# Patient Record
Sex: Male | Born: 1945 | Race: Black or African American | Hispanic: No | Smoking: Former smoker
Health system: Southern US, Community
[De-identification: ages and names within clinical notes are randomized; demographics above are authoritative.]

## PROBLEM LIST (undated history)

## (undated) DIAGNOSIS — I10 Essential (primary) hypertension: Secondary | ICD-10-CM

## (undated) DIAGNOSIS — E785 Hyperlipidemia, unspecified: Secondary | ICD-10-CM

## (undated) DIAGNOSIS — I4891 Unspecified atrial fibrillation: Secondary | ICD-10-CM

## (undated) DIAGNOSIS — I4892 Unspecified atrial flutter: Secondary | ICD-10-CM

## (undated) DIAGNOSIS — F528 Other sexual dysfunction not due to a substance or known physiological condition: Secondary | ICD-10-CM

## (undated) DIAGNOSIS — Z7901 Long term (current) use of anticoagulants: Secondary | ICD-10-CM

## (undated) DIAGNOSIS — R0609 Other forms of dyspnea: Secondary | ICD-10-CM

## (undated) DIAGNOSIS — Z86010 Personal history of colonic polyps: Secondary | ICD-10-CM

## (undated) DIAGNOSIS — IMO0002 Reserved for concepts with insufficient information to code with codable children: Secondary | ICD-10-CM

## (undated) DIAGNOSIS — R0600 Dyspnea, unspecified: Secondary | ICD-10-CM

## (undated) DIAGNOSIS — R932 Abnormal findings on diagnostic imaging of liver and biliary tract: Secondary | ICD-10-CM

## (undated) HISTORY — DX: Unspecified atrial fibrillation: I48.91

## (undated) HISTORY — DX: Long term (current) use of anticoagulants: Z79.01

## (undated) HISTORY — PX: POLYPECTOMY: SHX149

## (undated) HISTORY — DX: Other sexual dysfunction not due to a substance or known physiological condition: F52.8

## (undated) HISTORY — DX: Hyperlipidemia, unspecified: E78.5

## (undated) HISTORY — DX: Essential (primary) hypertension: I10

---
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
## 2005-08-03 ENCOUNTER — Ambulatory Visit: Payer: Self-pay | Admitting: Internal Medicine

## 2005-10-18 ENCOUNTER — Ambulatory Visit: Payer: Self-pay | Admitting: Internal Medicine

## 2005-11-02 ENCOUNTER — Ambulatory Visit: Payer: Self-pay | Admitting: Internal Medicine

## 2005-11-07 ENCOUNTER — Ambulatory Visit: Payer: Self-pay | Admitting: Internal Medicine

## 2006-09-27 ENCOUNTER — Ambulatory Visit: Payer: Self-pay | Admitting: Internal Medicine

## 2007-04-10 ENCOUNTER — Encounter: Payer: Self-pay | Admitting: Internal Medicine

## 2007-04-10 DIAGNOSIS — F528 Other sexual dysfunction not due to a substance or known physiological condition: Secondary | ICD-10-CM

## 2007-04-10 DIAGNOSIS — I10 Essential (primary) hypertension: Secondary | ICD-10-CM

## 2007-04-10 HISTORY — DX: Other sexual dysfunction not due to a substance or known physiological condition: F52.8

## 2007-04-10 HISTORY — DX: Essential (primary) hypertension: I10

## 2007-12-05 ENCOUNTER — Ambulatory Visit: Payer: Self-pay | Admitting: Internal Medicine

## 2007-12-05 LAB — CONVERTED CEMR LAB
Alkaline Phosphatase: 48 units/L (ref 39–117)
Bilirubin Urine: NEGATIVE
Bilirubin, Direct: 0.1 mg/dL (ref 0.0–0.3)
Crystals: NEGATIVE
GFR calc Af Amer: 61 mL/min
GFR calc non Af Amer: 51 mL/min
Glucose, Bld: 102 mg/dL — ABNORMAL HIGH (ref 70–99)
HCT: 41.2 % (ref 39.0–52.0)
HDL: 28.8 mg/dL — ABNORMAL LOW (ref >39.0)
Hemoglobin, Urine: NEGATIVE
LDL Cholesterol: 101 mg/dL — ABNORMAL HIGH (ref 0–99)
Leukocytes, UA: NEGATIVE
Lymphocytes Relative: 22 % (ref 12.0–46.0)
Monocytes Absolute: 0.9 10*3/uL (ref 0.1–1.0)
Monocytes Relative: 12.9 % — ABNORMAL HIGH (ref 3.0–12.0)
Nitrite: NEGATIVE
Platelets: 247 10*3/uL (ref 150–400)
Potassium: 3 meq/L — ABNORMAL LOW (ref 3.5–5.1)
RDW: 13.3 % (ref 11.5–14.6)
Sodium: 144 meq/L (ref 135–145)
Total Bilirubin: 0.6 mg/dL (ref 0.3–1.2)
Total CHOL/HDL Ratio: 4.8
Total Protein: 6.9 g/dL (ref 6.0–8.3)
Urobilinogen, UA: 0.2 (ref 0.0–1.0)
VLDL: 10 mg/dL (ref 0–40)

## 2008-03-18 ENCOUNTER — Telehealth (INDEPENDENT_AMBULATORY_CARE_PROVIDER_SITE_OTHER): Payer: Self-pay | Admitting: *Deleted

## 2008-04-09 ENCOUNTER — Ambulatory Visit: Payer: Self-pay | Admitting: Internal Medicine

## 2008-04-22 ENCOUNTER — Encounter: Payer: Self-pay | Admitting: Internal Medicine

## 2008-04-22 ENCOUNTER — Ambulatory Visit: Payer: Self-pay | Admitting: Internal Medicine

## 2008-04-22 DIAGNOSIS — Z86010 Personal history of colonic polyps: Secondary | ICD-10-CM | POA: Insufficient documentation

## 2008-04-27 ENCOUNTER — Encounter: Payer: Self-pay | Admitting: Internal Medicine

## 2008-12-10 ENCOUNTER — Ambulatory Visit: Payer: Self-pay | Admitting: Internal Medicine

## 2008-12-13 ENCOUNTER — Encounter: Payer: Self-pay | Admitting: Internal Medicine

## 2008-12-13 LAB — CONVERTED CEMR LAB
AST: 21 units/L (ref 0–37)
Albumin: 4 g/dL (ref 3.5–5.2)
Basophils Absolute: 0 10*3/uL (ref 0.0–0.1)
Basophils Relative: 0.3 % (ref 0.0–3.0)
CO2: 37 meq/L — ABNORMAL HIGH (ref 19–32)
Chloride: 100 meq/L (ref 96–112)
Cholesterol: 179 mg/dL (ref 0–200)
Eosinophils Absolute: 0.1 10*3/uL (ref 0.0–0.7)
Glucose, Bld: 88 mg/dL (ref 70–99)
HCT: 41.9 % (ref 39.0–52.0)
HDL: 29.9 mg/dL — ABNORMAL LOW (ref >39.00)
Hemoglobin: 14.2 g/dL (ref 13.0–17.0)
Ketones, ur: NEGATIVE mg/dL
Leukocytes, UA: NEGATIVE
Lymphs Abs: 1.8 10*3/uL (ref 0.7–4.0)
MCHC: 33.9 g/dL (ref 30.0–36.0)
Neutro Abs: 4.7 10*3/uL (ref 1.4–7.7)
PSA: 1.17 ng/mL (ref 0.10–4.00)
Potassium: 3.1 meq/L — ABNORMAL LOW (ref 3.5–5.1)
RDW: 13 % (ref 11.5–14.6)
Sodium: 143 meq/L (ref 135–145)
Specific Gravity, Urine: 1.015 (ref 1.000–1.030)
Total Protein: 7.8 g/dL (ref 6.0–8.3)
Urobilinogen, UA: 0.2 (ref 0.0–1.0)
VLDL: 32 mg/dL (ref 0.0–40.0)
pH: 7 (ref 5.0–8.0)

## 2009-11-28 ENCOUNTER — Ambulatory Visit: Payer: Self-pay | Admitting: Internal Medicine

## 2009-11-28 LAB — CONVERTED CEMR LAB
ALT: 19 units/L (ref 0–53)
AST: 24 units/L (ref 0–37)
Albumin: 3.8 g/dL (ref 3.5–5.2)
Alkaline Phosphatase: 59 units/L (ref 39–117)
Basophils Absolute: 0 10*3/uL (ref 0.0–0.1)
Basophils Relative: 0.4 % (ref 0.0–3.0)
Bilirubin Urine: NEGATIVE
Calcium: 9.7 mg/dL (ref 8.4–10.5)
Cholesterol: 176 mg/dL (ref 0–200)
Eosinophils Relative: 1.3 % (ref 0.0–5.0)
GFR calc non Af Amer: 78.79 mL/min (ref >60)
Glucose, Bld: 92 mg/dL (ref 70–99)
HCT: 39.7 % (ref 39.0–52.0)
Hemoglobin: 13.5 g/dL (ref 13.0–17.0)
Ketones, ur: NEGATIVE mg/dL
Leukocytes, UA: NEGATIVE
Lymphocytes Relative: 29.2 % (ref 12.0–46.0)
Lymphs Abs: 1.8 10*3/uL (ref 0.7–4.0)
Monocytes Relative: 8.9 % (ref 3.0–12.0)
Neutro Abs: 3.7 10*3/uL (ref 1.4–7.7)
Nitrite: NEGATIVE
PSA: 1 ng/mL (ref 0.10–4.00)
Potassium: 3.4 meq/L — ABNORMAL LOW (ref 3.5–5.1)
RBC: 4.51 M/uL (ref 4.22–5.81)
RDW: 14.2 % (ref 11.5–14.6)
Sodium: 145 meq/L (ref 135–145)
Specific Gravity, Urine: 1.025 (ref 1.000–1.030)
TSH: 0.53 microintl units/mL (ref 0.35–5.50)
Total Protein: 7.3 g/dL (ref 6.0–8.3)
Urobilinogen, UA: 0.2 (ref 0.0–1.0)
VLDL: 28.2 mg/dL (ref 0.0–40.0)
WBC: 6.2 10*3/uL (ref 4.5–10.5)
pH: 7 (ref 5.0–8.0)

## 2010-01-12 ENCOUNTER — Telehealth: Payer: Self-pay | Admitting: Internal Medicine

## 2010-09-12 NOTE — Assessment & Plan Note (Signed)
Summary: FU ON BP/NWS  #   Vital Signs:  Patient profile:   65 year old male Height:      70 inches Weight:      207 pounds BMI:     29.81 O2 Sat:      99 % on Room air Temp:     97.1 degrees F oral Pulse rate:   67 / minute BP sitting:   122 / 80  (left arm) Cuff size:   large  Vitals Entered ByZella Ball Ewing (November 28, 2009 1:26 PM)  O2 Flow:  Room air  CC: followup on Blood Pressure/RE   CC:  followup on Blood Pressure/RE.  History of Present Illness: not taking the diltiazem or K; but good compalicne with rest;  riding bike regularly and other isometric excercise;  Marcus Boyd denies CP, sob, doe, wheezing, orthopnea, pnd, worsening LE edema, palps, dizziness or syncope  Marcus Boyd denies new neuro symptoms such as headache, facial or extremity weakness   Tryign to foloow lower salt diet.    Preventive Screening-Counseling & Management      Drug Use:  no.    Problems Prior to Update: 1)  Preventive Health Care  (ICD-V70.0) 2)  Erectile Dysfunction  (ICD-302.72) 3)  Hypertension  (ICD-401.9)  Medications Prior to Update: 1)  Mult.vit. .... 1 By Mouth Qd 2)  Micardis Hct 80-25 Mg  Tabs (Telmisartan-Hctz) .Marland Kitchen.. 1 By Mouth Once Daily 3)  Diltiazem Hcl Cr 240 Mg Xr24h-Cap (Diltiazem Hcl) .Marland Kitchen.. 1 By Mouth Once Daily 4)  Adult Aspirin Ec Low Strength 81 Mg  Tbec (Aspirin) 5)  Viagra 100 Mg Tabs (Sildenafil Citrate) .Marland Kitchen.. 1 By Mouth Every Other Day As Needed 6)  Klor-Con 10 10 Meq Cr-Tabs (Potassium Chloride) .Marland Kitchen.. 1 By Mouth Once Daily  Current Medications (verified): 1)  Mult.vit. .... 1 By Mouth Qd 2)  Micardis Hct 80-25 Mg  Tabs (Telmisartan-Hctz) .Marland Kitchen.. 1 By Mouth Once Daily 3)  Adult Aspirin Ec Low Strength 81 Mg  Tbec (Aspirin) 4)  Viagra 100 Mg Tabs (Sildenafil Citrate) .Marland Kitchen.. 1 By Mouth Every Other Day As Needed  Allergies (verified): No Known Drug Allergies  Past History:  Past Medical History: Last updated: 12/05/2007 Hypertension E.D.  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         all otherwise negative per Marcus Boyd -    Physical Exam  General:  alert and overweight-appearing - mild  Head:  normocephalic and atraumatic.   Eyes:  vision grossly intact, pupils equal, and pupils round.   Ears:  R ear normal and L ear normal.   Nose:  no external deformity and no nasal discharge.   Mouth:  no gingival abnormalities and pharynx pink and moist.   Neck:  supple and no masses.   Lungs:  normal respiratory effort and normal breath sounds.   Heart:  normal rate and regular rhythm.   Abdomen:  soft, non-tender, and normal bowel sounds.   Msk:  no joint tenderness and no joint swelling.   Extremities:  no edema, no erythema  Neurologic:  cranial nerves II-XII intact and strength normal in all extremities.     Impression & Recommendations:  Problem # 1:  Preventive Health Care (ICD-V70.0)  Overall doing well, age appropriate education and counseling updated and referral for appropriate preventive services done unless  declined, immunizations up to date or declined, diet counseling done if overweight, urged to quit smoking if smokes , most recent labs reviewed and current ordered if appropriate, ecg reviewed or declined (interpretation per ECG scanned in the EMR if done); information regarding Medicare Prevention requirements given if appropriate   Orders: TLB-BMP (Basic Metabolic Panel-BMET) (80048-METABOL) TLB-CBC Platelet - w/Differential (85025-CBCD) TLB-Hepatic/Liver Function Pnl (80076-HEPATIC) TLB-Lipid Panel (80061-LIPID) TLB-TSH (Thyroid Stimulating Hormone) (84443-TSH) TLB-PSA (Prostate Specific Antigen) (84153-PSA) TLB-Udip ONLY (81003-UDIP)  Complete Medication List: 1)  Mult.vit.  .... 1 by mouth qd 2)  Micardis Hct 80-25 Mg Tabs (Telmisartan-hctz) .Marland Kitchen.. 1 by mouth once daily 3)  Adult Aspirin Ec Low Strength 81 Mg Tbec (Aspirin) 4)  Viagra 100 Mg Tabs (Sildenafil citrate) .Marland Kitchen.. 1 by mouth every other day as needed  Patient Instructions: 1)  Continue all previous medications as before this visit 2)  Please go to the Lab in the basement for your blood and/or urine tests today 3)  Please schedule a follow-up appointment in 1 year or sooner if needed Prescriptions: MICARDIS HCT 80-25 MG  TABS (TELMISARTAN-HCTZ) 1 by mouth once daily  #90 x 3   Entered and Authorized by:   Corwin Levins MD   Signed by:   Corwin Levins MD on 11/28/2009   Method used:   Print then Give to Patient   RxID:   [id number]

## 2010-09-12 NOTE — Progress Notes (Signed)
Summary: Rx req  Phone Note Refill Request   Refills Requested: Medication #1:  VIAGRA 100 MG TABS 1 by mouth every other day as needed.   Dosage confirmed as above?Dosage Confirmed Rx was not recieved at Pharmacy, verified with Pharmacist  Initial call taken by: Margaret Pyle, CMA,  January 12, 2010 11:06 AM    Prescriptions: VIAGRA 100 MG TABS (SILDENAFIL CITRATE) 1 by mouth every other day as needed  #5 x 11   Entered and Authorized by:   Margaret Pyle, CMA   Signed by:   Margaret Pyle, CMA on 01/12/2010   Method used:   Telephoned to ...       [id number] (retail)       [address]       [place], [place]  [address]       Ph: [phone] or [phone]       Fax: [phone]   RxID:   [id number]

## 2010-11-07 ENCOUNTER — Other Ambulatory Visit: Payer: Self-pay

## 2010-11-07 ENCOUNTER — Other Ambulatory Visit: Payer: Self-pay | Admitting: *Deleted

## 2010-11-07 DIAGNOSIS — Z0000 Encounter for general adult medical examination without abnormal findings: Secondary | ICD-10-CM

## 2010-11-07 DIAGNOSIS — Z0389 Encounter for observation for other suspected diseases and conditions ruled out: Secondary | ICD-10-CM

## 2010-11-14 ENCOUNTER — Ambulatory Visit (INDEPENDENT_AMBULATORY_CARE_PROVIDER_SITE_OTHER): Payer: BC Managed Care – PPO | Admitting: Internal Medicine

## 2010-11-14 ENCOUNTER — Other Ambulatory Visit (INDEPENDENT_AMBULATORY_CARE_PROVIDER_SITE_OTHER): Payer: BC Managed Care – PPO

## 2010-11-14 ENCOUNTER — Other Ambulatory Visit: Payer: Self-pay | Admitting: Internal Medicine

## 2010-11-14 ENCOUNTER — Other Ambulatory Visit: Payer: BC Managed Care – PPO

## 2010-11-14 ENCOUNTER — Other Ambulatory Visit (INDEPENDENT_AMBULATORY_CARE_PROVIDER_SITE_OTHER): Payer: BC Managed Care – PPO | Admitting: Internal Medicine

## 2010-11-14 ENCOUNTER — Encounter: Payer: Self-pay | Admitting: Internal Medicine

## 2010-11-14 VITALS — BP 120/82 | HR 77 | Temp 98.1°F | Ht 72.0 in | Wt 213.1 lb

## 2010-11-14 DIAGNOSIS — Z0000 Encounter for general adult medical examination without abnormal findings: Secondary | ICD-10-CM

## 2010-11-14 DIAGNOSIS — E785 Hyperlipidemia, unspecified: Secondary | ICD-10-CM | POA: Insufficient documentation

## 2010-11-14 DIAGNOSIS — Z23 Encounter for immunization: Secondary | ICD-10-CM

## 2010-11-14 DIAGNOSIS — I10 Essential (primary) hypertension: Secondary | ICD-10-CM

## 2010-11-14 DIAGNOSIS — Z0389 Encounter for observation for other suspected diseases and conditions ruled out: Secondary | ICD-10-CM

## 2010-11-14 DIAGNOSIS — Z0001 Encounter for general adult medical examination with abnormal findings: Secondary | ICD-10-CM | POA: Insufficient documentation

## 2010-11-14 HISTORY — DX: Hyperlipidemia, unspecified: E78.5

## 2010-11-14 LAB — URINALYSIS
Bilirubin Urine: NEGATIVE
Leukocytes, UA: NEGATIVE
Nitrite: NEGATIVE
Specific Gravity, Urine: 1.02 (ref 1.000–1.030)
Urobilinogen, UA: 0.2 (ref 0.0–1.0)
pH: 8 (ref 5.0–8.0)

## 2010-11-14 LAB — CBC WITH DIFFERENTIAL/PLATELET
Basophils Relative: 0.3 % (ref 0.0–3.0)
Eosinophils Absolute: 0.1 10*3/uL (ref 0.0–0.7)
Lymphocytes Relative: 24.7 % (ref 12.0–46.0)
MCHC: 34.1 g/dL (ref 30.0–36.0)
Monocytes Absolute: 0.7 10*3/uL (ref 0.1–1.0)
Neutrophils Relative %: 64.7 % (ref 43.0–77.0)
Platelets: 279 10*3/uL (ref 150.0–400.0)
RBC: 4.81 Mil/uL (ref 4.22–5.81)
WBC: 7.1 10*3/uL (ref 4.5–10.5)

## 2010-11-14 MED ORDER — TELMISARTAN-HCTZ 80-25 MG PO TABS: 1.0000 | ORAL_TABLET | Freq: Every day | ORAL | Status: DC

## 2010-11-14 MED ORDER — SILDENAFIL CITRATE 100 MG PO TABS: 100.0000 mg | ORAL_TABLET | ORAL | Status: DC | PRN

## 2010-11-14 NOTE — Patient Instructions (Signed)
You had the shingles shot today Please go to LAB in the Basement for the blood and/or urine tests to be done today Please call the number on the Blue Card (the [employer] System) for results of testing in 2-3 days Please return in 1 year for your yearly visit, or sooner if needed, with Lab testing done 3-5 days before

## 2010-11-14 NOTE — Assessment & Plan Note (Signed)
stable overall by hx and exam, most recent lab reviewed with pt, and pt to continue medical treatment as before   Lab Results  Component Value Date   WBC 6.2 11/28/2009   HGB 13.5 11/28/2009   HGB NEGATIVE 11/28/2009   HCT 39.7 11/28/2009   PLT 292.0 11/28/2009   CHOL 176 11/28/2009   TRIG 141.0 11/28/2009   HDL 35.00* 11/28/2009   ALT 19 11/28/2009   AST 24 11/28/2009   NA 145 11/28/2009   K 3.4* 11/28/2009   CL 101 11/28/2009   CREATININE 1.2 11/28/2009   BUN 10 11/28/2009   CO2 37* 11/28/2009   TSH 0.53 11/28/2009   PSA 1.00 11/28/2009

## 2010-11-14 NOTE — Progress Notes (Signed)
Subjective:    Patient ID: Marcus Boyd, male    DOB: 1946/05/09, 65 y.o.   MRN: [id number]  HPI Here for wellness and f/u;  Overall doing ok;  Pt denies CP, worsening SOB, DOE, wheezing, orthopnea, PND, worsening LE edema, palpitations, dizziness or syncope.  Pt denies neurological change such as new Headache, facial or extremity weakness.  Pt denies polydipsia, polyuria, or low sugar symptoms. Pt states overall good compliance with treatment and medications, good tolerability, and trying to follow lower cholesterol diet.  Pt denies worsening depressive symptoms, suicidal ideation or panic. No fever, wt loss, night sweats, loss of appetite, or other constitutional symptoms.  Pt states good ability with ADL's, low fall risk, home safety reviewed and adequate, no significant changes in hearing or vision, and occasionally active with exercise. Still drives and overall feels he can cont to work until 66yo, then retire.   Past Medical History  Diagnosis Date  . ERECTILE DYSFUNCTION 04/10/2007  . HYPERTENSION 04/10/2007  . Hyperlipemia 11/14/2010   No past surgical history on file.  reports that he has quit smoking. He does not have any smokeless tobacco history on file. He reports that he drinks alcohol. He reports that he does not use illicit drugs. family history includes Cancer in his father and Hypertension in his mother and sister. No Known Allergies  Current Outpatient Prescriptions on File Prior to Visit  Medication Sig Dispense Refill  . aspirin 81 MG EC tablet Take 81 mg by mouth daily.        . Multiple Vitamin (MULTIVITAMIN) tablet Take 1 tablet by mouth daily.        Marland Kitchen DISCONTD: telmisartan-hydrochlorothiazide (MICARDIS HCT) 80-25 MG per tablet Take 1 tablet by mouth daily.         Review of Systems Review of Systems  Constitutional: Negative for diaphoresis, activity change, appetite change and unexpected weight change.  HENT: Negative for hearing loss, ear pain, facial swelling,  mouth sores and neck stiffness.   Eyes: Negative for pain, redness and visual disturbance.  Respiratory: Negative for shortness of breath and wheezing.   Cardiovascular: Negative for chest pain and palpitations.  Gastrointestinal: Negative for diarrhea, blood in stool, abdominal distention and rectal pain.  Genitourinary: Negative for hematuria, flank pain and decreased urine volume.  Musculoskeletal: Negative for myalgias and joint swelling.  Skin: Negative for color change and wound.  Neurological: Negative for syncope and numbness.  Hematological: Negative for adenopathy.  Psychiatric/Behavioral: Negative for hallucinations, self-injury, decreased concentration and agitation.      Objective:   Physical Exam BP 120/82  Pulse 77  Temp(Src) 98.1 F (36.7 C) (Oral)  Ht 6' (1.829 m)  Wt 213 lb 2 oz (96.673 kg)  BMI 28.90 kg/m2  SpO2 98% Physical Exam  VS noted Constitutional: Pt is oriented to person, place, and time. Appears well-developed and well-nourished.  HENT:  Head: Normocephalic and atraumatic.  Right Ear: External ear normal.  Left Ear: External ear normal.  Nose: Nose normal.  Mouth/Throat: Oropharynx is clear and moist.  Eyes: Conjunctivae and EOM are normal. Pupils are equal, round, and reactive to light.  Neck: Normal range of motion. Neck supple. No JVD present. No tracheal deviation present.  Cardiovascular: Normal rate, regular rhythm, normal heart sounds and intact distal pulses.   Pulmonary/Chest: Effort normal and breath sounds normal.  Abdominal: Soft. Bowel sounds are normal. There is no tenderness.  Musculoskeletal: Normal range of motion. Exhibits no edema.  Lymphadenopathy:  Has no cervical adenopathy.  Neurological: Pt is alert and oriented to person, place, and time. Pt has normal reflexes. No cranial nerve deficit.  Skin: Skin is warm and dry. No rash noted.  Psychiatric:  Has  normal mood and affect. Behavior is normal.          Assessment &  Plan:

## 2010-11-14 NOTE — Assessment & Plan Note (Signed)
Mild, for diet for now, with goal ldl < 100  Lab Results  Component Value Date   LDLCALC 113* 11/28/2009

## 2010-11-15 LAB — HEPATIC FUNCTION PANEL
Alkaline Phosphatase: 59 U/L (ref 39–117)
Bilirubin, Direct: 0 mg/dL (ref 0.0–0.3)
Total Bilirubin: 0.5 mg/dL (ref 0.3–1.2)
Total Protein: 7 g/dL (ref 6.0–8.3)

## 2010-11-15 LAB — LIPID PANEL
Total CHOL/HDL Ratio: 6
VLDL: 58.2 mg/dL — ABNORMAL HIGH (ref 0.0–40.0)

## 2010-11-15 LAB — LDL CHOLESTEROL, DIRECT: Direct LDL: 99.3 mg/dL

## 2011-05-23 ENCOUNTER — Encounter: Payer: Self-pay | Admitting: Internal Medicine

## 2011-10-31 ENCOUNTER — Other Ambulatory Visit: Payer: Self-pay | Admitting: Internal Medicine

## 2011-11-16 ENCOUNTER — Telehealth: Payer: Self-pay

## 2011-11-16 ENCOUNTER — Ambulatory Visit (INDEPENDENT_AMBULATORY_CARE_PROVIDER_SITE_OTHER)
Admission: RE | Admit: 2011-11-16 | Discharge: 2011-11-16 | Disposition: A | Discharge status: Home / Self Care | Payer: BC Managed Care – PPO | Source: Ambulatory Visit | Attending: Internal Medicine | Admitting: Internal Medicine

## 2011-11-16 ENCOUNTER — Other Ambulatory Visit (INDEPENDENT_AMBULATORY_CARE_PROVIDER_SITE_OTHER): Payer: BC Managed Care – PPO

## 2011-11-16 ENCOUNTER — Encounter: Payer: Self-pay | Admitting: Internal Medicine

## 2011-11-16 ENCOUNTER — Ambulatory Visit (INDEPENDENT_AMBULATORY_CARE_PROVIDER_SITE_OTHER): Payer: BC Managed Care – PPO | Admitting: Internal Medicine

## 2011-11-16 VITALS — BP 100/70 | HR 100 | Temp 97.6°F | Resp 16 | Ht 72.0 in | Wt 217.0 lb

## 2011-11-16 DIAGNOSIS — I4891 Unspecified atrial fibrillation: Secondary | ICD-10-CM

## 2011-11-16 DIAGNOSIS — Z0000 Encounter for general adult medical examination without abnormal findings: Secondary | ICD-10-CM

## 2011-11-16 DIAGNOSIS — I10 Essential (primary) hypertension: Secondary | ICD-10-CM

## 2011-11-16 DIAGNOSIS — Z7901 Long term (current) use of anticoagulants: Secondary | ICD-10-CM

## 2011-11-16 DIAGNOSIS — I483 Typical atrial flutter: Secondary | ICD-10-CM | POA: Insufficient documentation

## 2011-11-16 HISTORY — DX: Unspecified atrial fibrillation: I48.91

## 2011-11-16 LAB — CBC WITH DIFFERENTIAL/PLATELET
Basophils Relative: 0.2 % (ref 0.0–3.0)
Eosinophils Relative: 1.5 % (ref 0.0–5.0)
Lymphocytes Relative: 31.5 % (ref 12.0–46.0)
MCV: 87.9 fl (ref 78.0–100.0)
Monocytes Absolute: 0.7 10*3/uL (ref 0.1–1.0)
Neutrophils Relative %: 57.5 % (ref 43.0–77.0)
RBC: 4.65 Mil/uL (ref 4.22–5.81)
WBC: 7.3 10*3/uL (ref 4.5–10.5)

## 2011-11-16 LAB — URINALYSIS, ROUTINE W REFLEX MICROSCOPIC
Hgb urine dipstick: NEGATIVE
Ketones, ur: NEGATIVE
Leukocytes, UA: NEGATIVE
Specific Gravity, Urine: 1.02 (ref 1.000–1.030)
Urine Glucose: NEGATIVE
Urobilinogen, UA: 0.2 (ref 0.0–1.0)

## 2011-11-16 LAB — HEPATIC FUNCTION PANEL
ALT: 16 U/L (ref 0–53)
Alkaline Phosphatase: 54 U/L (ref 39–117)
Bilirubin, Direct: 0.1 mg/dL (ref 0.0–0.3)
Total Protein: 7 g/dL (ref 6.0–8.3)

## 2011-11-16 LAB — PROTIME-INR
INR: 1 ratio (ref 0.8–1.0)
Prothrombin Time: 11.4 s (ref 10.2–12.4)

## 2011-11-16 LAB — BASIC METABOLIC PANEL
BUN: 12 mg/dL (ref 6–23)
Calcium: 9.4 mg/dL (ref 8.4–10.5)
Creatinine, Ser: 1.2 mg/dL (ref 0.4–1.5)
GFR: 78.29 mL/min (ref >60.00)

## 2011-11-16 LAB — LIPID PANEL: Cholesterol: 163 mg/dL (ref 0–200)

## 2011-11-16 MED ORDER — METOPROLOL SUCCINATE ER 50 MG PO TB24: 50.0000 mg | ORAL_TABLET | Freq: Every day | ORAL | Status: DC

## 2011-11-16 MED ORDER — WARFARIN SODIUM 5 MG PO TABS: ORAL_TABLET | ORAL | Status: DC

## 2011-11-16 MED ORDER — SILDENAFIL CITRATE 100 MG PO TABS: 100.0000 mg | ORAL_TABLET | ORAL | Status: DC | PRN

## 2011-11-16 MED ORDER — TELMISARTAN-HCTZ 40-12.5 MG PO TABS: 1.0000 | ORAL_TABLET | Freq: Every day | ORAL | Status: DC

## 2011-11-16 NOTE — Telephone Encounter (Signed)
Pt states simply not able to return for INR monitoring at this time, so I consider coumadin start to be high risk at this time, higher than yearly risk of afib related cva at approx 8% per yr;  Ideally he should start coumadin longterm, but simply not logistically able, though pt understands the risk of stroke.  OK for now for ASA 325 mg, with f/u as planned, also still for echo, metoprolol and micardis hct changes, and card referral

## 2011-11-16 NOTE — Patient Instructions (Addendum)
You have a new problem called atrial fibrillation today, and currently your heart rate is too fast Please STOP the aspirin  Please STOP the Micardis HCT 80/25 mg per day Please start the Micardis HCT 40/12.5 mg - 1 per day (you are given the hardcopy of the prescription today, to start after you finish taking HALF of the medication you have leftover at home of the higher strength)  Please start the Coumadin (warfarin) blood thinner;  This is a very strong blood thinner to prevent stroke from the atrial fibrillation; your starting dose is 5 mg per day, but this may need to be adjusted depending on your blood tests in the future.  Please plan to return on Tuesday November 20, 2011 for a coumadin blood test ('INR" - your goal is to be between 2 - 3, with normal being about 1.0)  Please start the metoprolol ER 50 mg per day (to get the heart rate down) - this was sent on the computer to your pharmacy  Please go to LAB in the Basement for the blood and/or urine tests to be done today Please go to XRAY in the Basement for the x-ray test today  You will be contacted regarding the referral for: echocardiogram, and cardiology referral  You will be contacted by phone if any changes need to be made immediately based on your blood work today.  Otherwise, you will receive a letter about your results with an explanation.  Please return here for Office Visit in 1 week; please avoid exertion such as running on treadmill until you see cardiology

## 2011-11-16 NOTE — Telephone Encounter (Signed)
Patient was instructed by MD to follow-up on 10/20/11 to check PT/INR after starting coumadin from 10/16/11 CPE.  The patient stated he could not return on 3/9 due to work. Dr. Jonny Ruiz requested taking the patient out of work for one week, but patient could not do so.  The patient was then instructed not to start coumadin at this time, but was instructed to take aspirin 325 mg daily and return in 2 weeks to follow up with Dr. Jonny Ruiz. Patient understood instructions.

## 2011-11-16 NOTE — Telephone Encounter (Signed)
Correction on dates for above note. Follow-up date should be 11/20/11 and CPE 11/16/11.

## 2011-11-16 NOTE — Assessment & Plan Note (Addendum)
Overall doing well, age appropriate education and counseling updated, referrals for preventative services and immunizations addressed, dietary and smoking counseling addressed, most recent labs and ECG reviewed.  I have personally reviewed and have noted: 1) the patient's medical and social history 2) The pt's use of alcohol, tobacco, and illicit drugs 3) The patient's current medications and supplements 4) Functional ability including ADL's, fall risk, home safety risk, hearing and visual impairment 5) Diet and physical activities 6) Evidence for depression or mood disorder 7) The patient's height, weight, and BMI have been recorded in the chart I have made referrals, and provided counseling and education based on review of the above ECG reviewed as per emr, declines further immunizations

## 2011-11-17 ENCOUNTER — Encounter: Payer: Self-pay | Admitting: Internal Medicine

## 2011-11-17 NOTE — Assessment & Plan Note (Signed)
Volume stable today, will need to decr the micardis HCT to half to accomodate the beta blocker,  to f/u any worsening symptoms or concerns

## 2011-11-17 NOTE — Progress Notes (Signed)
Subjective:    Patient ID: Marcus Boyd, male    DOB: [birth year], 66 y.o.   MRN: [id number]  HPI  Here for wellness and f/u;  Overall doing ok;  Pt denies CP, worsening SOB, DOE, wheezing, orthopnea, PND, worsening LE edema, palpitations, dizziness or syncope.  Pt denies neurological change such as new Headache, facial or extremity weakness.  Pt denies polydipsia, polyuria, or low sugar symptoms. Pt states overall good compliance with treatment and medications, good tolerability, and trying to follow lower cholesterol diet.  Pt denies worsening depressive symptoms, suicidal ideation or panic. No fever, wt loss, night sweats, loss of appetite, or other constitutional symptoms.  Pt states good ability with ADL's, low fall risk, home safety reviewed and adequate, no significant changes in hearing or vision, and occasionally active with exercise.  No acute complaints.  Works as Naval architect mostly out of town. Past Medical History  Diagnosis Date  . ERECTILE DYSFUNCTION 04/10/2007  . HYPERTENSION 04/10/2007  . Hyperlipemia 11/14/2010  . Atrial fibrillation, new onset 11/16/2011  . Warfarin anticoagulation 11/16/2011   History reviewed. No pertinent past surgical history.  reports that he has quit smoking. He has never used smokeless tobacco. He reports that he drinks alcohol. He reports that he does not use illicit drugs. family history includes Cancer in his father and Hypertension in his mother and sister. No Known Allergies Current Outpatient Prescriptions on File Prior to Visit  Medication Sig Dispense Refill  . Multiple Vitamin (MULTIVITAMIN) tablet Take 1 tablet by mouth daily.        . metoprolol succinate (TOPROL-XL) 50 MG 24 hr tablet Take 1 tablet (50 mg total) by mouth daily. Take with or immediately following a meal.  90 tablet  3  . sildenafil (VIAGRA) 100 MG tablet Take 1 tablet (100 mg total) by mouth as needed for erectile dysfunction.  5 tablet  11  . warfarin (COUMADIN) 5 MG tablet  Use as instructed, once per day  150 tablet  3   Review of Systems Review of Systems  Constitutional: Negative for diaphoresis, activity change, appetite change and unexpected weight change.  HENT: Negative for hearing loss, ear pain, facial swelling, mouth sores and neck stiffness.   Eyes: Negative for pain, redness and visual disturbance.  Respiratory: Negative for shortness of breath and wheezing.   Cardiovascular: Negative for chest pain and palpitations.  Gastrointestinal: Negative for diarrhea, blood in stool, abdominal distention and rectal pain.  Genitourinary: Negative for hematuria, flank pain and decreased urine volume.  Musculoskeletal: Negative for myalgias and joint swelling.  Skin: Negative for color change and wound.  Neurological: Negative for syncope and numbness.  Hematological: Negative for adenopathy.  Psychiatric/Behavioral: Negative for hallucinations, self-injury, decreased concentration and agitation.      Objective:   Physical Exam BP 100/70  Pulse 100  Temp(Src) 97.6 F (36.4 C) (Oral)  Resp 16  Ht 6' (1.829 m)  Wt 217 lb (98.431 kg)  BMI 29.43 kg/m2  SpO2 98% Physical Exam  VS noted Constitutional: Pt is oriented to person, place, and time. Appears well-developed and well-nourished.  HENT:  Head: Normocephalic and atraumatic.  Right Ear: External ear normal.  Left Ear: External ear normal.  Nose: Nose normal.  Mouth/Throat: Oropharynx is clear and moist.  Eyes: Conjunctivae and EOM are normal. Pupils are equal, round, and reactive to light.  Neck: Normal range of motion. Neck supple. No JVD present. No tracheal deviation present.  Cardiovascular: tachy, irreg rhythm, normal heart sounds and intact  distal pulses.   Pulmonary/Chest: Effort normal and breath sounds normal.  Abdominal: Soft. Bowel sounds are normal. There is no tenderness.  Musculoskeletal: Normal range of motion. Exhibits no edema.  Lymphadenopathy:  Has no cervical adenopathy.    Neurological: Pt is alert and oriented to person, place, and time. Pt has normal reflexes. No cranial nerve deficit.  Skin: Skin is warm and dry. No rash noted.  Psychiatric:  Has  normal mood and affect. Behavior is normal.     Assessment & Plan:

## 2011-11-17 NOTE — Assessment & Plan Note (Signed)
For baseling INR today, then 5 qd, f/u next tues apr 9

## 2011-11-17 NOTE — Assessment & Plan Note (Signed)
New onset, asympt, to start metoprolol 50 for better rate control, stop asa, start coumadin, for echo, card referral

## 2011-11-23 ENCOUNTER — Ambulatory Visit: Payer: BC Managed Care – PPO | Admitting: Internal Medicine

## 2011-11-30 ENCOUNTER — Ambulatory Visit (INDEPENDENT_AMBULATORY_CARE_PROVIDER_SITE_OTHER): Payer: BC Managed Care – PPO | Admitting: Internal Medicine

## 2011-11-30 ENCOUNTER — Ambulatory Visit (HOSPITAL_COMMUNITY): Payer: BC Managed Care – PPO | Attending: Cardiovascular Disease

## 2011-11-30 ENCOUNTER — Other Ambulatory Visit: Payer: Self-pay

## 2011-11-30 ENCOUNTER — Encounter: Payer: Self-pay | Admitting: Internal Medicine

## 2011-11-30 ENCOUNTER — Other Ambulatory Visit (INDEPENDENT_AMBULATORY_CARE_PROVIDER_SITE_OTHER): Payer: BC Managed Care – PPO

## 2011-11-30 VITALS — BP 110/72 | HR 95 | Temp 98.1°F | Ht 72.0 in | Wt 218.2 lb

## 2011-11-30 DIAGNOSIS — Z7901 Long term (current) use of anticoagulants: Secondary | ICD-10-CM

## 2011-11-30 DIAGNOSIS — I4891 Unspecified atrial fibrillation: Secondary | ICD-10-CM | POA: Insufficient documentation

## 2011-11-30 DIAGNOSIS — E785 Hyperlipidemia, unspecified: Secondary | ICD-10-CM

## 2011-11-30 DIAGNOSIS — R0609 Other forms of dyspnea: Secondary | ICD-10-CM | POA: Insufficient documentation

## 2011-11-30 DIAGNOSIS — R0989 Other specified symptoms and signs involving the circulatory and respiratory systems: Secondary | ICD-10-CM | POA: Insufficient documentation

## 2011-11-30 DIAGNOSIS — Z0000 Encounter for general adult medical examination without abnormal findings: Secondary | ICD-10-CM

## 2011-11-30 DIAGNOSIS — I10 Essential (primary) hypertension: Secondary | ICD-10-CM | POA: Insufficient documentation

## 2011-11-30 DIAGNOSIS — Z87891 Personal history of nicotine dependence: Secondary | ICD-10-CM | POA: Insufficient documentation

## 2011-11-30 DIAGNOSIS — I059 Rheumatic mitral valve disease, unspecified: Secondary | ICD-10-CM | POA: Insufficient documentation

## 2011-11-30 DIAGNOSIS — I517 Cardiomegaly: Secondary | ICD-10-CM | POA: Insufficient documentation

## 2011-11-30 LAB — HEPATIC FUNCTION PANEL
ALT: 18 U/L (ref 0–53)
AST: 22 U/L (ref 0–37)
Albumin: 3.9 g/dL (ref 3.5–5.2)
Alkaline Phosphatase: 56 U/L (ref 39–117)
Total Protein: 7.1 g/dL (ref 6.0–8.3)

## 2011-11-30 LAB — CBC WITH DIFFERENTIAL/PLATELET
Basophils Relative: 0.3 % (ref 0.0–3.0)
HCT: 42 % (ref 39.0–52.0)
Hemoglobin: 13.8 g/dL (ref 13.0–17.0)
Lymphocytes Relative: 30.1 % (ref 12.0–46.0)
MCHC: 32.8 g/dL (ref 30.0–36.0)
Monocytes Relative: 9.6 % (ref 3.0–12.0)
Neutro Abs: 4.4 10*3/uL (ref 1.4–7.7)
RBC: 4.73 Mil/uL (ref 4.22–5.81)

## 2011-11-30 LAB — LIPID PANEL
Cholesterol: 174 mg/dL (ref 0–200)
Triglycerides: 220 mg/dL — ABNORMAL HIGH (ref 0.0–149.0)

## 2011-11-30 LAB — BASIC METABOLIC PANEL
Calcium: 9 mg/dL (ref 8.4–10.5)
Creatinine, Ser: 1.2 mg/dL (ref 0.4–1.5)
GFR: 76.8 mL/min (ref >60.00)

## 2011-11-30 LAB — PROTIME-INR: INR: 1.2 ratio — ABNORMAL HIGH (ref 0.8–1.0)

## 2011-11-30 LAB — URINALYSIS, ROUTINE W REFLEX MICROSCOPIC
Hgb urine dipstick: NEGATIVE
Leukocytes, UA: NEGATIVE
Urine Glucose: NEGATIVE
Urobilinogen, UA: 0.2 (ref 0.0–1.0)

## 2011-11-30 LAB — TSH: TSH: 0.93 u[IU]/mL (ref 0.35–5.50)

## 2011-11-30 LAB — PSA: PSA: 2.6 ng/mL (ref 0.10–4.00)

## 2011-11-30 MED ORDER — METOPROLOL SUCCINATE ER 100 MG PO TB24: 100.0000 mg | ORAL_TABLET | Freq: Every day | ORAL | Status: DC

## 2011-11-30 MED ORDER — HYDROCHLOROTHIAZIDE 12.5 MG PO CAPS: 12.5000 mg | ORAL_CAPSULE | Freq: Every day | ORAL | Status: DC

## 2011-11-30 MED ORDER — LISINOPRIL 5 MG PO TABS: 5.0000 mg | ORAL_TABLET | Freq: Every day | ORAL | Status: DC

## 2011-11-30 NOTE — Patient Instructions (Addendum)
Please continue the coumadin as you are doing  We will need a blood test today called the INR and we want the result to be between 2 and 3;  You will be called with the results, and if the dose needs to be changed OK to stop the Aspirin completely OK to stop the Micardis HCT completely OK to stop the metoprolol ER 50 mg  Please start Lisinopril 5 mg per day (a low dose) Please start Hydrochlorothizide at 12.5 mg per day (a low fluid pill, the same as you have been taking with the MicardisHCT) Please start metoprolol ER 100 mg per day Please see the [facility] today to schedule your cardiology appointment All of your new medications are sent to the pharmacy today

## 2011-11-30 NOTE — Assessment & Plan Note (Addendum)
ECG reviewed as per emr  - afib rate approx 130 on current metoprolol ER 50 qd,  Volume stable on micardisHCT 40/12.5;  Echo reviewed as well with pt - EF 35-40% with only mild LAA,  Will increase the metoprolol ER to 100 qd, stop the micardis HCT, start lisinopril 5 qd, and hctz 12.5 d, and pt to make appt with card, no way to tell time of onset but ? May be candidate for cardioversion?  Note:  Time for pt hx, eval, review of record with pt in room, determination of the diagnosis, plan for eval and tx with pt including counseling is > 40 min

## 2011-12-01 NOTE — Progress Notes (Signed)
  Subjective:    Patient ID: Marcus Boyd, male    DOB: 1946/05/11, 66 y.o.   MRN: [id number]  HPI  Here to f/u;  At last visit pt was overwhelmed with tx recommendations, took ASA 325 mg daily and did not take the coumadin until 7 days ago at 5 mg per day, now taking both.  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  No overt bleeding or bruising.  Curiously remains asymptomatic  - Pt denies chest pain, increased sob or doe, wheezing, orthopnea, PND, increased LE swelling, palpitations, dizziness or syncope.  Pt denies new neurological symptoms such as new headache, or facial or extremity weakness or numbness   Pt denies polydipsia, polyuria. No other acute complaints Past Medical History  Diagnosis Date  . ERECTILE DYSFUNCTION 04/10/2007  . HYPERTENSION 04/10/2007  . Hyperlipemia 11/14/2010  . Atrial fibrillation, new onset 11/16/2011  . Warfarin anticoagulation 11/16/2011   No past surgical history on file.  reports that he has quit smoking. He has never used smokeless tobacco. He reports that he drinks alcohol. He reports that he does not use illicit drugs. family history includes Cancer in his father and Hypertension in his mother and sister. No Known Allergies Current Outpatient Prescriptions on File Prior to Visit  Medication Sig Dispense Refill  . Multiple Vitamin (MULTIVITAMIN) tablet Take 1 tablet by mouth daily.        . sildenafil (VIAGRA) 100 MG tablet Take 100 mg by mouth as needed.      . warfarin (COUMADIN) 5 MG tablet Use as instructed, once per day  150 tablet  3  . hydrochlorothiazide (MICROZIDE) 12.5 MG capsule Take 1 capsule (12.5 mg total) by mouth daily.  90 capsule  3  . lisinopril (PRINIVIL,ZESTRIL) 5 MG tablet Take 1 tablet (5 mg total) by mouth daily.  90 tablet  3  . metoprolol succinate (TOPROL-XL) 100 MG 24 hr tablet Take 1 tablet (100 mg total) by mouth  daily. Take with or immediately following a meal.  90 tablet  3   Review of Systems Review of Systems  Constitutional: Negative for diaphoresis and unexpected weight change.  Eyes: Negative for photophobia and visual disturbance.  Respiratory: Negative for choking and stridor.   Gastrointestinal: Negative for vomiting and blood in stool.  Genitourinary: Negative for hematuria and decreased urine volume.  Musculoskeletal: Negative for gait problem.  Skin: Negative for color change and wound.  Neurological: Negative for tremors and numbness.     Objective:   Physical Exam BP 110/72  Pulse 95  Temp(Src) 98.1 F (36.7 C) (Oral)  Ht 6' (1.829 m)  Wt 218 lb 3.2 oz (98.975 kg)  BMI 29.59 kg/m2  SpO2 98% Physical Exam  VS noted Constitutional: Pt appears well-developed and well-nourished.  HENT: Head: Normocephalic.  Right Ear: External ear normal.  Left Ear: External ear normal.  Eyes: Conjunctivae and EOM are normal. Pupils are equal, round, and reactive to light.  Neck: Normal range of motion. Neck supple.  Cardiovascular: tachy rate and irregular rhythm.   Pulmonary/Chest: Effort normal and breath sounds normal.  Abd:  Soft, NT, non-distended, + BS Neurological: Pt is alert.  Skin: Skin is warm. No erythema.  Psychiatric: Pt behavior is normal. Thought content normal.     Assessment & Plan:

## 2011-12-01 NOTE — Assessment & Plan Note (Signed)
meds adjusted today, to d/c micardis hct as outlined,  to f/u any worsening symptoms or concerns

## 2011-12-01 NOTE — Assessment & Plan Note (Signed)
Lab Results  Component Value Date   LDLCALC 101* 11/16/2011   To cont diet for now, no further new meds, if proves to have CAD will need to consider goal LDL < 70

## 2011-12-01 NOTE — Assessment & Plan Note (Signed)
To stop asa, cont the coumadin at 5 mg, for INR today, will need INR in the 2-3 range for 3-4 wks I should think if cardioversion to be considered;  ? May be more long term candidate for xaraleto or pradaxa

## 2011-12-03 ENCOUNTER — Encounter: Payer: Self-pay | Admitting: Internal Medicine

## 2011-12-03 LAB — LDL CHOLESTEROL, DIRECT: Direct LDL: 98.6 mg/dL

## 2011-12-04 ENCOUNTER — Telehealth: Payer: Self-pay

## 2011-12-04 ENCOUNTER — Other Ambulatory Visit (HOSPITAL_COMMUNITY): Payer: BC Managed Care – PPO

## 2011-12-04 DIAGNOSIS — Z7901 Long term (current) use of anticoagulants: Secondary | ICD-10-CM

## 2011-12-04 NOTE — Telephone Encounter (Signed)
Put order in for PT/INR 

## 2012-01-01 ENCOUNTER — Ambulatory Visit (INDEPENDENT_AMBULATORY_CARE_PROVIDER_SITE_OTHER): Payer: BC Managed Care – PPO | Admitting: Internal Medicine

## 2012-01-01 ENCOUNTER — Encounter: Payer: Self-pay | Admitting: Internal Medicine

## 2012-01-01 VITALS — BP 120/76 | HR 114 | Ht 72.0 in | Wt 218.0 lb

## 2012-01-01 DIAGNOSIS — I4892 Unspecified atrial flutter: Secondary | ICD-10-CM

## 2012-01-01 DIAGNOSIS — F528 Other sexual dysfunction not due to a substance or known physiological condition: Secondary | ICD-10-CM

## 2012-01-01 DIAGNOSIS — I483 Typical atrial flutter: Secondary | ICD-10-CM

## 2012-01-01 DIAGNOSIS — I10 Essential (primary) hypertension: Secondary | ICD-10-CM

## 2012-01-01 NOTE — Progress Notes (Signed)
HPI [name] is referred today by Dr. Jonny Ruiz for evaluation of atrial fibrillation. I've reviewed the patient's electrocardiograms and can only find atrial flutter. He has no palpitations. He is a very pleasant 66 year old man with hypertension and erectile dysfunction. He has been on multiple medications for hypertension over the years. Recently, he has been well-controlled. The duration of his atrial flutter is unknown. He was initially diagnosed one month ago. The patient was started on Coumadin. His most recent check was just over a month ago when his INR was 1.2. He has had no syncope. He denies dyspnea, peripheral edema, chest pain, or palpitation. No Known Allergies   Current Outpatient Prescriptions  Medication Sig Dispense Refill  . hydrochlorothiazide (MICROZIDE) 12.5 MG capsule Take 1 capsule (12.5 mg total) by mouth daily.  90 capsule  3  . lisinopril (PRINIVIL,ZESTRIL) 5 MG tablet Take 1 tablet (5 mg total) by mouth daily.  90 tablet  3  . metoprolol succinate (TOPROL-XL) 100 MG 24 hr tablet Take 1 tablet (100 mg total) by mouth daily. Take with or immediately following a meal.  90 tablet  3  . MICARDIS HCT 80-25 MG per tablet Take 1 tablet by mouth daily.       . Multiple Vitamin (MULTIVITAMIN) tablet Take 1 tablet by mouth daily.        . sildenafil (VIAGRA) 100 MG tablet Take 100 mg by mouth as needed.      . warfarin (COUMADIN) 5 MG tablet Use as instructed, once per day  150 tablet  3     Past Medical History  Diagnosis Date  . ERECTILE DYSFUNCTION 04/10/2007  . HYPERTENSION 04/10/2007  . Hyperlipemia 11/14/2010  . Atrial fibrillation, new onset 11/16/2011  . Warfarin anticoagulation 11/16/2011    ROS:   All systems reviewed and negative except as noted in the HPI.   No past surgical history on file.   Family History  Problem Relation Age of Onset  . Hypertension Mother   . Cancer Father     unsure what kind of cancer  . Hypertension Sister     twin      History   Social History  . Marital Status: Married    Spouse Name: N/A    Number of Children: 3  . Years of Education: N/A   Occupational History  . Truck Acupuncturist   Social History Main Topics  . Smoking status: Former Games developer  . Smokeless tobacco: Never Used  . Alcohol Use: Yes  . Drug Use: No  . Sexually Active: Not on file   Other Topics Concern  . Not on file   Social History Narrative  . No narrative on file     BP 120/76  Pulse 114  Ht 6' (1.829 m)  Wt 218 lb (98.884 kg)  BMI 29.57 kg/m2  Physical Exam:  Well appearing middle-aged man, NAD HEENT: Unremarkable Neck:  No JVD, no thyromegally Lungs:  Clear with no wheezes, rales, or rhonchi. No increased work of breathing. HEART:  IRegular tachy rhythm, no murmurs, no rubs, no clicks Abd:  soft, positive bowel sounds, no organomegally, no rebound, no guarding Ext:  2 plus pulses, no edema, no cyanosis, no clubbing Skin:  No rashes no nodules Neuro:  CN II through XII intact, motor grossly intact  EKG Atrial flutter with a variable ventricular response.  Assess/Plan:

## 2012-01-01 NOTE — Assessment & Plan Note (Signed)
He is currently on multiple medications and his blood pressure is reasonably well controlled. No change in medications at this time. He will maintain a low-sodium diet.

## 2012-01-01 NOTE — Assessment & Plan Note (Signed)
He will continue on as needed Viagra.

## 2012-01-01 NOTE — Patient Instructions (Signed)
Your physician has recommended that you have an ablation. Catheter ablation is a medical procedure used to treat some cardiac arrhythmias (irregular heartbeats). During catheter ablation, a long, thin, flexible tube is put into a blood vessel in your groin (upper thigh), or neck. This tube is called an ablation catheter. It is then guided to your heart through the blood vessel. Radio frequency waves destroy small areas of heart tissue where abnormal heartbeats may cause an arrhythmia to start. Please see the instruction sheet given to you today.   Your physician recommends that you return for lab work today and then again in 2 weeks with Dr Raphael Gibney office  Will schedule for an ablation on 01/31/12 if INR's are good

## 2012-01-01 NOTE — Assessment & Plan Note (Signed)
Today we discussed the pathophysiology and mechanisms of treatment of atrial flutter. In addition we reviewed the differences between atrial flutter and atrial fibrillation. I've gone back to the computer and reviewed all EKGs. These are all atrial flutter. Medical therapy as well as catheter ablation of atrial flutter were discussed in detail. The patient would like to proceed with catheter ablation. Unfortunately, we do not yet have a therapeutic INR. I will have him check his Coumadin level today. Once he has had 3 or 4 weeks of therapeutic INR, we'll plan to proceed with catheter ablation of atrial flutter. The risk, goals, benefits, and expectations of the procedure have been discussed with the patient and he wishes to proceed.

## 2012-01-02 LAB — PROTIME-INR: Prothrombin Time: 12.8 s — ABNORMAL HIGH (ref 10.2–12.4)

## 2012-01-15 ENCOUNTER — Telehealth: Payer: Self-pay

## 2012-01-15 DIAGNOSIS — Z7901 Long term (current) use of anticoagulants: Secondary | ICD-10-CM

## 2012-01-15 NOTE — Telephone Encounter (Signed)
Called the patient to inform to come in today to check INR.  The patient is in [place] and informed would get in later in the week have go to the lab.  Informed of all instructions. Put order in.

## 2012-01-15 NOTE — Telephone Encounter (Signed)
Robin to contact pt - needs INR today if possible, then weekly as below

## 2012-01-15 NOTE — Telephone Encounter (Signed)
Message copied by Pincus Sanes on Tue Jan 15, 2012 11:07 AM ------      Message from: Deliah Boston      Created: Tue Jan 15, 2012 10:30 AM       Pt's INR needs to be 2.0 or greater but less than 3.0 for 3 consecutive weeks in order to proceed with ablation.  Please get pt in for weekly INR checks and let me know once he has been therapeutic for 3 consecutive weeks       Thanks       Tresa Endo            I have him set up for 01/31/12 and at this point I don't know he will be ready.  Pt is expecting a call from you guys

## 2012-01-17 ENCOUNTER — Encounter (HOSPITAL_COMMUNITY): Payer: Self-pay | Admitting: Respiratory Therapy

## 2012-01-18 NOTE — Telephone Encounter (Signed)
Please call pt to remind - needs to have the INR done

## 2012-01-21 ENCOUNTER — Other Ambulatory Visit (INDEPENDENT_AMBULATORY_CARE_PROVIDER_SITE_OTHER): Payer: BC Managed Care – PPO

## 2012-01-21 ENCOUNTER — Encounter: Payer: Self-pay | Admitting: Internal Medicine

## 2012-01-21 DIAGNOSIS — Z7901 Long term (current) use of anticoagulants: Secondary | ICD-10-CM

## 2012-01-21 LAB — PROTIME-INR: INR: 1.2 ratio — ABNORMAL HIGH (ref 0.8–1.0)

## 2012-01-22 ENCOUNTER — Other Ambulatory Visit: Payer: Self-pay | Admitting: Internal Medicine

## 2012-01-22 NOTE — Telephone Encounter (Signed)
Done in error.

## 2012-01-22 NOTE — Telephone Encounter (Signed)
Please call pt;  Please let pt know we had recommended he take the coumadin at 5 mg per day, with 7.5 mg on mon and thur  Has he been doing this?  We have to be very exact about this, to be able to change his dosing

## 2012-01-22 NOTE — Telephone Encounter (Signed)
Called the patient and he will call back once not driving and check his medication.

## 2012-01-22 NOTE — Telephone Encounter (Signed)
The patient called back and informed he has been taking the warfarin 5 mg everyday.  Informed the patient per MD instructions to take warfarin 5 mg every day, but on Wednesday and Saturday to take warfarin 7.5 mg.  Patient understood directions.  Informed to return to the lab next week to recheck , he agreed to do so.

## 2012-01-22 NOTE — Telephone Encounter (Signed)
Called the patient and he did come in yesterday 01/21/12.  Informed per MD results were normal.  The patient does take warfarin everyday, but he was unsure how much he takes daily.

## 2012-01-28 ENCOUNTER — Other Ambulatory Visit (INDEPENDENT_AMBULATORY_CARE_PROVIDER_SITE_OTHER): Payer: BC Managed Care – PPO

## 2012-01-28 DIAGNOSIS — Z7901 Long term (current) use of anticoagulants: Secondary | ICD-10-CM

## 2012-01-28 LAB — PROTIME-INR
INR: 1.5 ratio — ABNORMAL HIGH (ref 0.8–1.0)
Prothrombin Time: 16.2 s — ABNORMAL HIGH (ref 10.2–12.4)

## 2012-02-04 ENCOUNTER — Other Ambulatory Visit (INDEPENDENT_AMBULATORY_CARE_PROVIDER_SITE_OTHER): Payer: BC Managed Care – PPO

## 2012-02-04 ENCOUNTER — Telehealth: Payer: Self-pay

## 2012-02-04 DIAGNOSIS — Z7901 Long term (current) use of anticoagulants: Secondary | ICD-10-CM

## 2012-02-04 LAB — PROTIME-INR: INR: 1.6 ratio — ABNORMAL HIGH (ref 0.8–1.0)

## 2012-02-04 NOTE — Telephone Encounter (Signed)
Put PR/INR in for lab.

## 2012-02-04 NOTE — Telephone Encounter (Signed)
Put order in for PT/INR 

## 2012-02-11 ENCOUNTER — Other Ambulatory Visit (INDEPENDENT_AMBULATORY_CARE_PROVIDER_SITE_OTHER): Payer: BC Managed Care – PPO

## 2012-02-11 ENCOUNTER — Telehealth: Payer: Self-pay

## 2012-02-11 DIAGNOSIS — Z7901 Long term (current) use of anticoagulants: Secondary | ICD-10-CM

## 2012-02-11 NOTE — Telephone Encounter (Signed)
Put order in for PT/INR 

## 2012-02-18 ENCOUNTER — Other Ambulatory Visit (INDEPENDENT_AMBULATORY_CARE_PROVIDER_SITE_OTHER): Payer: BC Managed Care – PPO

## 2012-02-18 ENCOUNTER — Telehealth: Payer: Self-pay

## 2012-02-18 DIAGNOSIS — Z7901 Long term (current) use of anticoagulants: Secondary | ICD-10-CM

## 2012-02-18 LAB — PROTIME-INR: INR: 2 ratio — ABNORMAL HIGH (ref 0.8–1.0)

## 2012-02-18 NOTE — Telephone Encounter (Signed)
Put order in for PT/INR 

## 2012-02-25 ENCOUNTER — Other Ambulatory Visit: Payer: Self-pay

## 2012-02-25 ENCOUNTER — Other Ambulatory Visit (INDEPENDENT_AMBULATORY_CARE_PROVIDER_SITE_OTHER): Payer: BC Managed Care – PPO

## 2012-02-25 ENCOUNTER — Telehealth: Payer: Self-pay

## 2012-02-25 DIAGNOSIS — Z7901 Long term (current) use of anticoagulants: Secondary | ICD-10-CM

## 2012-02-25 LAB — PROTIME-INR
INR: 2 ratio — ABNORMAL HIGH (ref 0.8–1.0)
Prothrombin Time: 21.8 s — ABNORMAL HIGH (ref 10.2–12.4)

## 2012-02-25 MED ORDER — METOPROLOL SUCCINATE ER 100 MG PO TB24: 100.0000 mg | ORAL_TABLET | Freq: Every day | ORAL | Status: DC

## 2012-02-25 MED ORDER — HYDROCHLOROTHIAZIDE 12.5 MG PO CAPS: 12.5000 mg | ORAL_CAPSULE | Freq: Every day | ORAL | Status: DC

## 2012-02-25 NOTE — Telephone Encounter (Signed)
Put order in for PT/INR expected date 03/03/12.

## 2012-02-27 ENCOUNTER — Telehealth: Payer: Self-pay | Admitting: *Deleted

## 2012-02-27 NOTE — Telephone Encounter (Signed)
Zella Ball called me back and has not canceled patients procedure.  We will reschedule him to 03/12/12.  He will keep his INR appointment as scheduled

## 2012-02-27 NOTE — Telephone Encounter (Signed)
Called patient to discuss final details for his ablation for tomorrow and he states he was told by Zella Ball Ewing that the ablation has been canceled.  I let him know that I am had told him back in June that as lon as his INR was 2.0 he would be doing the ablation on 02/28/12.  We have working diligently to get him done since June and he is now in [place] and will have to be moved to another day as he is not going to be in town.  I have left Zella Ball a message to call me in regards to his care

## 2012-02-28 ENCOUNTER — Other Ambulatory Visit: Payer: Self-pay | Admitting: *Deleted

## 2012-02-28 ENCOUNTER — Encounter: Payer: Self-pay | Admitting: *Deleted

## 2012-02-28 DIAGNOSIS — I4892 Unspecified atrial flutter: Secondary | ICD-10-CM

## 2012-02-29 ENCOUNTER — Telehealth: Payer: Self-pay | Admitting: Internal Medicine

## 2012-02-29 NOTE — Telephone Encounter (Signed)
Called the patient informed of date of procedure and that he is to come back to the lab Monday as scheduled.

## 2012-02-29 NOTE — Telephone Encounter (Signed)
Pt wants to know when his heart procedure is going to be.  He has a lab appt for Monday.  He wanted to know if he needs to keep it.

## 2012-03-03 ENCOUNTER — Encounter: Payer: Self-pay | Admitting: Internal Medicine

## 2012-03-03 ENCOUNTER — Other Ambulatory Visit (INDEPENDENT_AMBULATORY_CARE_PROVIDER_SITE_OTHER): Payer: BC Managed Care – PPO

## 2012-03-03 ENCOUNTER — Telehealth: Payer: Self-pay | Admitting: Internal Medicine

## 2012-03-03 DIAGNOSIS — I10 Essential (primary) hypertension: Secondary | ICD-10-CM

## 2012-03-03 LAB — HEPATIC FUNCTION PANEL
ALT: 15 U/L (ref 0–53)
Albumin: 3.8 g/dL (ref 3.5–5.2)
Alkaline Phosphatase: 46 U/L (ref 39–117)
Total Protein: 7.1 g/dL (ref 6.0–8.3)

## 2012-03-03 LAB — BASIC METABOLIC PANEL
CO2: 32 mEq/L (ref 19–32)
Calcium: 9.1 mg/dL (ref 8.4–10.5)
Chloride: 102 mEq/L (ref 96–112)
Creatinine, Ser: 1.2 mg/dL (ref 0.4–1.5)
Glucose, Bld: 109 mg/dL — ABNORMAL HIGH (ref 70–99)
Sodium: 143 mEq/L (ref 135–145)

## 2012-03-03 LAB — CBC WITH DIFFERENTIAL/PLATELET
Basophils Absolute: 0 10*3/uL (ref 0.0–0.1)
Eosinophils Absolute: 0.1 10*3/uL (ref 0.0–0.7)
Hemoglobin: 13.9 g/dL (ref 13.0–17.0)
Lymphocytes Relative: 25.6 % (ref 12.0–46.0)
MCHC: 32.8 g/dL (ref 30.0–36.0)
MCV: 88.2 fl (ref 78.0–100.0)
Monocytes Absolute: 0.5 10*3/uL (ref 0.1–1.0)
Neutro Abs: 4.2 10*3/uL (ref 1.4–7.7)
Neutrophils Relative %: 66 % (ref 43.0–77.0)
RDW: 14.5 % (ref 11.5–14.6)

## 2012-03-03 NOTE — Telephone Encounter (Signed)
Labs ordered.

## 2012-03-03 NOTE — Telephone Encounter (Signed)
Message copied by Corwin Levins on Mon Mar 03, 2012 11:45 AM ------      Message from: Scharlene Gloss B      Created: Mon Mar 03, 2012  7:58 AM       The patient was in the lab this am. Came to get bmet and cbc for upcoming procedure, but no order had been placed.  Also stated he would not get INR/PT here but later in the week elsewhere.

## 2012-03-10 ENCOUNTER — Other Ambulatory Visit (INDEPENDENT_AMBULATORY_CARE_PROVIDER_SITE_OTHER): Payer: BC Managed Care – PPO

## 2012-03-10 ENCOUNTER — Telehealth: Payer: Self-pay

## 2012-03-10 DIAGNOSIS — Z7901 Long term (current) use of anticoagulants: Secondary | ICD-10-CM

## 2012-03-10 NOTE — Telephone Encounter (Signed)
Put order in for PT/INR 

## 2012-03-12 ENCOUNTER — Ambulatory Visit (HOSPITAL_COMMUNITY)
Admission: RE | Admit: 2012-03-12 | Discharge: 2012-03-13 | Disposition: A | Discharge status: Home / Self Care | Payer: BC Managed Care – PPO | Source: Ambulatory Visit | Attending: Internal Medicine | Admitting: Internal Medicine

## 2012-03-12 ENCOUNTER — Encounter (HOSPITAL_COMMUNITY)
Admission: RE | Disposition: A | Discharge status: Home / Self Care | Payer: Self-pay | Source: Ambulatory Visit | Attending: Internal Medicine

## 2012-03-12 ENCOUNTER — Encounter (HOSPITAL_COMMUNITY): Payer: Self-pay | Admitting: General Practice

## 2012-03-12 DIAGNOSIS — N529 Male erectile dysfunction, unspecified: Secondary | ICD-10-CM | POA: Insufficient documentation

## 2012-03-12 DIAGNOSIS — I4892 Unspecified atrial flutter: Secondary | ICD-10-CM | POA: Insufficient documentation

## 2012-03-12 DIAGNOSIS — E785 Hyperlipidemia, unspecified: Secondary | ICD-10-CM | POA: Insufficient documentation

## 2012-03-12 DIAGNOSIS — I483 Typical atrial flutter: Secondary | ICD-10-CM

## 2012-03-12 DIAGNOSIS — I10 Essential (primary) hypertension: Secondary | ICD-10-CM | POA: Insufficient documentation

## 2012-03-12 DIAGNOSIS — I059 Rheumatic mitral valve disease, unspecified: Secondary | ICD-10-CM

## 2012-03-12 DIAGNOSIS — Z7901 Long term (current) use of anticoagulants: Secondary | ICD-10-CM | POA: Insufficient documentation

## 2012-03-12 HISTORY — DX: Unspecified atrial fibrillation: I48.91

## 2012-03-12 HISTORY — DX: Reserved for concepts with insufficient information to code with codable children: IMO0002

## 2012-03-12 HISTORY — DX: Other forms of dyspnea: R06.09

## 2012-03-12 HISTORY — DX: Dyspnea, unspecified: R06.00

## 2012-03-12 HISTORY — DX: Unspecified atrial flutter: I48.92

## 2012-03-12 HISTORY — PX: ATRIAL FLUTTER ABLATION: SHX5733

## 2012-03-12 HISTORY — PX: RADIOFREQUENCY ABLATION: SHX2290

## 2012-03-12 LAB — PROTIME-INR: Prothrombin Time: 26.2 seconds — ABNORMAL HIGH (ref 11.6–15.2)

## 2012-03-12 SURGERY — ATRIAL FLUTTER ABLATION
Anesthesia: LOCAL

## 2012-03-12 MED ORDER — WARFARIN - PHYSICIAN DOSING INPATIENT: Freq: Every day | Status: DC

## 2012-03-12 MED ORDER — MIDAZOLAM HCL 5 MG/5ML IJ SOLN
INTRAMUSCULAR | Status: AC
  Filled 2012-03-12: qty 5

## 2012-03-12 MED ORDER — WARFARIN SODIUM 5 MG PO TABS
5.0000 mg | ORAL_TABLET | Freq: Every day | ORAL | Status: DC
  Administered 2012-03-12: 16:00:00 5 mg via ORAL
  Filled 2012-03-12 (×2): qty 1

## 2012-03-12 MED ORDER — METOPROLOL SUCCINATE ER 100 MG PO TB24
100.0000 mg | ORAL_TABLET | Freq: Every day | ORAL | Status: DC
  Administered 2012-03-12 – 2012-03-13 (×2): 100 mg via ORAL
  Filled 2012-03-12 (×2): qty 1

## 2012-03-12 MED ORDER — ONDANSETRON HCL 4 MG/2ML IJ SOLN: 4.0000 mg | Freq: Four times a day (QID) | INTRAMUSCULAR | Status: DC | PRN

## 2012-03-12 MED ORDER — ACETAMINOPHEN 325 MG PO TABS: 650.0000 mg | ORAL_TABLET | ORAL | Status: DC | PRN

## 2012-03-12 MED ORDER — SODIUM CHLORIDE 0.9 % IV SOLN: 250.0000 mL | INTRAVENOUS | Status: DC | PRN

## 2012-03-12 MED ORDER — LISINOPRIL 5 MG PO TABS
5.0000 mg | ORAL_TABLET | Freq: Every day | ORAL | Status: DC
  Administered 2012-03-12 – 2012-03-13 (×2): 5 mg via ORAL
  Filled 2012-03-12 (×2): qty 1

## 2012-03-12 MED ORDER — SODIUM CHLORIDE 0.9 % IV SOLN
INTRAVENOUS | Status: DC
  Administered 2012-03-12: 20 mL/h via INTRAVENOUS

## 2012-03-12 MED ORDER — HYDROCHLOROTHIAZIDE 12.5 MG PO CAPS
12.5000 mg | ORAL_CAPSULE | Freq: Every day | ORAL | Status: DC
  Administered 2012-03-12 – 2012-03-13 (×2): 12.5 mg via ORAL
  Filled 2012-03-12 (×2): qty 1

## 2012-03-12 MED ORDER — HEPARIN SODIUM (PORCINE) 1000 UNIT/ML IJ SOLN
INTRAMUSCULAR | Status: AC
  Filled 2012-03-12: qty 1

## 2012-03-12 MED ORDER — SODIUM CHLORIDE 0.9 % IJ SOLN: 3.0000 mL | Freq: Two times a day (BID) | INTRAMUSCULAR | Status: DC

## 2012-03-12 MED ORDER — SODIUM CHLORIDE 0.9 % IJ SOLN: 3.0000 mL | INTRAMUSCULAR | Status: DC | PRN

## 2012-03-12 MED ORDER — FENTANYL CITRATE 0.05 MG/ML IJ SOLN
INTRAMUSCULAR | Status: AC
  Filled 2012-03-12: qty 2

## 2012-03-12 MED ORDER — BUPIVACAINE HCL (PF) 0.25 % IJ SOLN
INTRAMUSCULAR | Status: AC
  Filled 2012-03-12: qty 60

## 2012-03-12 NOTE — H&P (Signed)
HPI  Marcus Boyd is admitted today for catheter ablation of atrial flutter.  I've reviewed the patient's electrocardiograms and can only find atrial flutter. He has no palpitations. He is a very pleasant 66 year old man with hypertension and erectile dysfunction. He has been on multiple medications for hypertension over the years. Recently, he has been well-controlled. The duration of his atrial flutter is unknown. He was initially diagnosed one month ago. The patient was started on Coumadin and has had over 3 weeks of therapeutic INR's. He has had no syncope. He denies dyspnea, peripheral edema, chest pain, or palpitation.  No Known Allergies  Current Outpatient Prescriptions   Medication  Sig  Dispense  Refill   .  hydrochlorothiazide (MICROZIDE) 12.5 MG capsule  Take 1 capsule (12.5 mg total) by mouth daily.  90 capsule  3   .  lisinopril (PRINIVIL,ZESTRIL) 5 MG tablet  Take 1 tablet (5 mg total) by mouth daily.  90 tablet  3   .  metoprolol succinate (TOPROL-XL) 100 MG 24 hr tablet  Take 1 tablet (100 mg total) by mouth daily. Take with or immediately following a meal.  90 tablet  3   .  MICARDIS HCT 80-25 MG per tablet  Take 1 tablet by mouth daily.     .  Multiple Vitamin (MULTIVITAMIN) tablet  Take 1 tablet by mouth daily.     .  sildenafil (VIAGRA) 100 MG tablet  Take 100 mg by mouth as needed.     .  warfarin (COUMADIN) 5 MG tablet  Use as instructed, once per day  150 tablet  3    Past Medical History   Diagnosis  Date   .  ERECTILE DYSFUNCTION  04/10/2007   .  HYPERTENSION  04/10/2007   .  Hyperlipemia  11/14/2010   .  Atrial fibrillation, new onset  11/16/2011   .  Warfarin anticoagulation  11/16/2011    ROS:  All systems reviewed and negative except as noted in the HPI.  No past surgical history on file.  Family History   Problem  Relation  Age of Onset   .  Hypertension  Mother    .  Cancer  Father       unsure what kind of cancer    .  Hypertension  Sister       twin     History    Social History   .  Marital Status:  Married     Spouse Name:  N/A     Number of Children:  3   .  Years of Education:  N/A    Occupational History   .  Truck Nurse, learning disability    Social History Main Topics   .  Smoking status:  Former Games developer   .  Smokeless tobacco:  Never Used   .  Alcohol Use:  Yes   .  Drug Use:  No   .  Sexually Active:  Not on file    Other Topics  Concern   .  Not on file    Social History Narrative   .  No narrative on file    BP 120/76  Pulse 114  Ht 6' (1.829 m)  Wt 218 lb (98.884 kg)  BMI 29.57 kg/m2  Physical Exam:  Well appearing middle-aged man, NAD  HEENT: Unremarkable  Neck: No JVD, no thyromegally  Lungs: Clear with no wheezes, rales, or rhonchi. No increased work of breathing.  HEART: IRegular tachy rhythm,  no murmurs, no rubs, no clicks  Abd: soft, positive bowel sounds, no organomegally, no rebound, no guarding  Ext: 2 plus pulses, no edema, no cyanosis, no clubbing  Skin: No rashes no nodules  Neuro: CN II through XII intact, motor grossly intact  EKG  Atrial flutter with a variable ventricular response.  Assess/Plan:   Typical atrial flutter - Lewayne Bunting, MD 01/01/2012 5:42 PM Signed  Today we discussed the pathophysiology and mechanisms of treatment of atrial flutter. In addition we reviewed the differences between atrial flutter and atrial fibrillation. I've gone back to the computer and reviewed all EKGs. These are all atrial flutter. Medical therapy as well as catheter ablation of atrial flutter were discussed in detail. The patient would like to proceed with catheter ablation. Unfortunately, we do not yet have a therapeutic INR. I will have him check his Coumadin level today. Once he has had 3 or 4 weeks of therapeutic INR, we'll plan to proceed with catheter ablation of atrial flutter. The risk, goals, benefits, and expectations of the procedure have been discussed with the patient and he wishes to  proceed. HYPERTENSION - Lewayne Bunting, MD 01/01/2012 5:43 PM Signed  He is currently on multiple medications and his blood pressure is reasonably well controlled. No change in medications at this time. He will maintain a low-sodium diet.

## 2012-03-12 NOTE — Research (Signed)
Block CTI Informed Consent   Subject Name: Marcus Boyd  Subject met inclusion and exclusion criteria.  The informed consent form, study requirements and expectations were reviewed with the subject and questions and concerns were addressed prior to the signing of the consent form.  The subject verbalized understanding of the trail requirements.  The subject agreed to participate in the Block CTI trial and signed the informed consent.  The informed consent was obtained prior to performance of any protocol-specific procedures for the subject.  A copy of the signed informed consent was given to the subject and a copy was placed in the subject's medical record.  Loletha Carrow 03/12/2012, 9:00

## 2012-03-12 NOTE — Interval H&P Note (Signed)
History and Physical Interval Note:  03/12/2012 9:50 AM  Marcus Boyd  has presented today for surgery, with the diagnosis of aflutter  The various methods of treatment have been discussed with the patient and family. After consideration of risks, benefits and other options for treatment, the patient has consented to  Procedure(s) (LRB): ATRIAL FLUTTER ABLATION (N/A) as a surgical intervention .  The patient's history has been reviewed, patient examined, no change in status, stable for surgery.  I have reviewed the patient's chart and labs.  Questions were answered to the patient's satisfaction.     Lewayne Bunting

## 2012-03-12 NOTE — Op Note (Signed)
EPS/RFA of atrial flutter without immediate complication. [id number].

## 2012-03-13 ENCOUNTER — Other Ambulatory Visit: Payer: Self-pay | Admitting: *Deleted

## 2012-03-13 DIAGNOSIS — Z7901 Long term (current) use of anticoagulants: Secondary | ICD-10-CM

## 2012-03-13 DIAGNOSIS — I4892 Unspecified atrial flutter: Secondary | ICD-10-CM

## 2012-03-13 LAB — PROTIME-INR: Prothrombin Time: 22.1 seconds — ABNORMAL HIGH (ref 11.6–15.2)

## 2012-03-13 MED ORDER — OFF THE BEAT BOOK
Freq: Once | Status: DC
  Filled 2012-03-13: qty 1

## 2012-03-13 NOTE — Op Note (Signed)
Marcus Boyd, Marcus Boyd            ACCOUNT NO.:  [id number]  MEDICAL RECORD NO.:  [id number]  LOCATION:  6525                         FACILITY:  [facility]  PHYSICIAN:  Doylene Canning. Ladona Ridgel, MD    DATE OF BIRTH:  1946/07/19  DATE OF PROCEDURE:  03/12/2012 DATE OF DISCHARGE:                              OPERATIVE REPORT   PROCEDURE PERFORMED:  Electrophysiologic study and radiofrequency catheter ablation of atrial flutter.  INTRODUCTION:  The patient is a very pleasant 66 year old male with recurrent atrial flutter and rapid ventricular response.  He is on therapeutic anticoagulation.  He is now referred for catheter ablation.  PROCEDURE:  After informed consent was obtained, the patient was taken to the diagnostic EP lab in a fasting state.  After usual preparation and draping, intravenous fentanyl and midazolam was given for sedation. A 6-French hexapolar catheter was inserted percutaneously in the right jugular vein and advanced to the coronary sinus.  A 6-French quadripolar catheter was inserted percutaneously in the right femoral vein and advanced to the His bundle region.  After measurement of basic intervals, rapid atrial pacing was carried out from the coronary sinus and stepwise decreased down to 370 milliseconds demonstrating AV Wenckebach.  During rapid atrial pacing, the PR interval was less than the RR interval and there was no inducible SVT.  Programmed atrial stimulation was carried out from the atrium at base drive cycle length of 161 msec.  The S1-S2 interval stepwise decreased down to 360 msec where the AV node ERP was observed.  During programmed atrial stimulation, there were no AH jumps, no echo beats, no inducible SVT. Next, additional rapid atrial pacing was carried out from the atrium at a pacing cycle length of 290 msec and stepwise decreased down to 200 msec resulting in the initiation of atrial flutter.  During rapid atrial pacing, the PR interval was less than the  RR interval.  Mapping of the atrial flutter demonstrated typical counterclockwise tricuspid annular reentry.  The cycle length was 250 msec.  A 7-French quadripolar ablation catheter was then maneuvered into the right atrium.  Mapping was carried out demonstrating a larger than normal atrial flutter isthmus.  A total of 13 RF energy applications were subsequently delivered to the atrial flutter isthmus resulting in termination of atrial flutter, and restoration of sinus rhythm.  Prior to ablation, the flutter interval lengthened just prior to termination of flutter. Following the creation of bidirectional isthmus block, rapid ventricular pacing was carried out from the right ventricle demonstrating VA dissociation at 600 msec.  Programmed ventricular stimulation was carried out from the right ventricle demonstrating VA Wenckebach as well.  At this point, the patient was observed for 30 minutes and had no recurrent atrial flutter isthmus conduction.  The catheters were then removed.  Hemostasis was assured and the patient was returned to his room in satisfactory condition.  COMPLICATIONS:  There were no immediate procedure complications.  RESULTS:  A.  Baseline ECG demonstrated sinus bradycardia with normal axis and intervals. B.  Baseline intervals.  HV interval was 48 msec.  QRS duration was 106 msec.  Following ablation AH interval was 118 msec.  No change in the HV interval. C.  Rapid ventricular  pacing.  Rapid ventricular pacing was carried out from the right ventricle demonstrating VA dissociation. D.  Programmed ventricular stimulation.  Programmed ventricular stimulation was carried out from the right ventricle demonstrating VA dissociation at 600 msec. E.  Programmed atrial stimulation.  Programmed atrial stimulation was carried out from the atrium at base drive cycle length of 161 msec.  The S1-S2 interval stepwise decreased down to 360 msec resulting in the AV node ERP.   During programmed atrial stimulation, there were no AH jumps and no echo beats. F.  Rapid atrial pacing.  Rapid atrial pacing was carried out from the atrium at a base drive cycle length of 096 msec and stepwise decreased down to 370 msec where AV Wenckebach was observed.  Additional decrements down to 200 msec resulting in the initiation of atrial flutter. G.  Arrhythmias observed. 1. Atrial flutter initiation was with rapid atrial pacing, duration     was sustained, cycle length 250 msec.  Method of termination was     with catheter ablation.     a.     Mapping.  Mapping of the atrial flutter isthmus demonstrated      to be unusually large.     b.     RF energy application.  A total of 13 RF energy applications      were delivered to the atrial flutter isthmus resulting in      termination of atrial flutter, and creation of bidirectional block      in the atrial flutter isthmus.  CONCLUSIONS:  This study demonstrates successful electrophysiologic study and RF catheter ablation of typical atrial flutter with a total of 13 RF energy applications delivered to the atrial flutter isthmus.  This resulted in creation of bidirectional block in atrial flutter isthmus.     Doylene Canning. Ladona Ridgel, MD     GWT/MEDQ  D:  03/12/2012  T:  03/13/2012  Job:  [id number]

## 2012-03-13 NOTE — Progress Notes (Signed)
   ELECTROPHYSIOLOGY ROUNDING NOTE    Patient Name: Marcus Boyd Date of Encounter: 03-13-2012    SUBJECTIVE:Patient feels well.  No chest pain or shortness of breath. Status post RFCA atrial flutter 03-12-2012  TELEMETRY: Reviewed telemetry pt in sinus rhythm, no arrrhythmias Filed Vitals:   03/12/12 2033 03/13/12 0009 03/13/12 0515 03/13/12 0754  BP: 121/65 125/85 132/72 126/75  Pulse: 58 60 57 54  Temp: 97.3 F (36.3 C) 97.8 F (36.6 C) 97.6 F (36.4 C) 97.4 F (36.3 C)  TempSrc: Oral Oral Oral Oral  Resp: 19 18 14 15   Height:      Weight:  207 lb 10.8 oz (94.2 kg)    SpO2: 100% 98% 98% 99%    Intake/Output Summary (Last 24 hours) at 03/13/12 0803 Last data filed at 03/13/12 0757  Gross per 24 hour  Intake    600 ml  Output   1400 ml  Net   -800 ml    GEN- The patient is well appearing, alert and oriented x 3 today.   Head- normocephalic, atraumatic Eyes-  Sclera clear, conjunctiva pink Ears- hearing intact Oropharynx- clear Neck- supple, no JVP Lymph- no cervical lymphadenopathy Lungs- Clear to ausculation bilaterally, normal work of breathing Heart- Regular rate and rhythm, no murmurs, rubs or gallops, PMI not laterally displaced GI- soft, NT, ND, + BS Extremities- no clubbing, cyanosis, or edema, no hematoma/ bruit Neuro- strength and sensation are intact  LABS: INR- 1.90 (pt in sinus rhythm at start of procedure yesterday)   Assessment and Plan:  1. Atrial flutter- doing well s/p ablation  DC to home Wound care, restrictions reviewed with patient. INR check scheduled with Dr Raphael Gibney office on Tuesday.  See Dr Ladona Ridgel in 4 weeks.  Fayrene Fearing Lisett Dirusso,MD

## 2012-03-13 NOTE — Discharge Summary (Signed)
ELECTROPHYSIOLOGY PROCEDURE DISCHARGE SUMMARY    Patient ID: Marcus Boyd,  MRN: [id number], DOB/AGE: 65-Mar-1947 65 y.o.  Admit date: 03/12/2012 Discharge date: 03/13/2012  Primary Care Physician: Oliver Barre, MD Primary Cardiologist: Lewayne Bunting, MD  Primary Discharge Diagnosis:  Atrial flutter status post ablation this admission.   Secondary Discharge Diagnosis:  1.  Hypertension 2.  Hyperlipidemia 3.  Chronic anticoagulation with Warfarin 4.  Erectile dysfunction  Procedures This Admission:  1.  Electrophysiology study and radiofrequency catheter ablation of atrial flutter on 03-12-2012.  This study demonstrated sinus bradycardia on presentation with inducible typical atrial flutter.  Bidirectional isthmus block was achieved.  There were no early apparent complications  Brief HPI: Mr. Qualley was referred by Dr. Jonny Ruiz to Dr Ladona Ridgel in the outpatient setting for evaluation of atrial fibrillation. I've reviewed the patient's electrocardiograms and can only find atrial flutter. He has no palpitations. He is a very pleasant 66 year old man with hypertension and erectile dysfunction. He has been on multiple medications for hypertension over the years. Recently, he has been well-controlled. The duration of his atrial flutter is unknown. He was initially diagnosed one month ago. The patient was started on Coumadin. He has had no syncope. He denies dyspnea, peripheral edema, chest pain, or palpitation.  Risks, benefits, and alternatives to ablation were reviewed with the patient who wished to proceed.  Hospital Course:  The patient was admitted on 03-12-2012 for planned ablation of atrial flutter.  This was carried out by Dr Ladona Ridgel with details as outlined above.  He was monitored on telemetry overnight which demonstrated sinus rhythm.  His groin and neck incisions were without complications.  He was examined by Dr Johney Frame and considered stable for discharge to home.  He will have an INR  check on Tuesday with Dr Jonny Ruiz and follow up with Dr Ladona Ridgel in 4 weeks.   Discharge Vitals: Blood pressure 126/75, pulse 54, temperature 97.4 F (36.3 C), temperature source Oral, resp. rate 15, height 6' (1.829 m), weight 207 lb 10.8 oz (94.2 kg), SpO2 99.00%.    Labs:   Lab Results  Component Value Date   WBC 6.4 03/03/2012   HGB 13.9 03/03/2012   HCT 42.6 03/03/2012   MCV 88.2 03/03/2012   PLT 255.0 03/03/2012    INR- 2.36 on 7-31, 1.9 on 8-1  Discharge Medications:  Medication List  As of 03/13/2012 11:16 AM   STOP taking these medications         MICARDIS HCT 80-25 MG per tablet         TAKE these medications         hydrochlorothiazide 12.5 MG capsule   Commonly known as: MICROZIDE   Take 1 capsule (12.5 mg total) by mouth daily.      lisinopril 5 MG tablet   Commonly known as: PRINIVIL,ZESTRIL   Take 1 tablet (5 mg total) by mouth daily.      metoprolol succinate 100 MG 24 hr tablet   Commonly known as: TOPROL-XL   Take 1 tablet (100 mg total) by mouth daily. Take with or immediately following a meal.      multivitamin tablet   Take 1 tablet by mouth daily.      sildenafil 100 MG tablet   Commonly known as: VIAGRA   Take 100 mg by mouth as needed.      warfarin 5 MG tablet   Commonly known as: COUMADIN   Take 5 mg by mouth daily.  Disposition:  Discharge Orders    Future Appointments: Provider: Department: Dept Phone: Center:   03/18/2012 8:10 AM [facility] [phone] None   04/16/2012 9:00 AM Marinus Maw, MD [facility] [phone] [facility]   11/21/2012 3:00 PM Corwin Levins, MD [facility] [phone] [facility]     Future Orders Please Complete By Expires   Diet - low sodium heart healthy      Increase activity slowly      Discharge instructions      Comments:   **PLEASE REMEMBER TO BRING ALL OF YOUR MEDICATIONS TO EACH OF YOUR FOLLOW-UP OFFICE VISITS.  *KEEP GROIN/NECK SITES CLEAN AND DRY. Call the office for any  signs of bleedings, pus, swelling, increased pain, or any other concerns. * NO HEAVY LIFTING (>10lbs) OR SEXUAL ACTIVITY X 7 DAYS. * NO DRIVING X 2-3 DAYS. * NO SOAKING BATHS, HOT TUBS, POOLS, ETC., X 7 DAYS.     Follow-up Information    Follow up with [facility] on 03/18/2012. (Please have labs drawn)    Contact information:   [address] [place] [address]       Follow up with Oliver Barre, MD. (As needed)    Contact information:   [address] [facility] [address] [place] [address] [place] [address] [phone]       Follow up with Lewayne Bunting, MD on 04/16/2012. (9:00)    Contact information:   [facility] [address] [place] [address] [phone]          Duration of Discharge Encounter: Greater than 30 minutes including physician time.  Signed, Gypsy Balsam, RN, BSN 03/13/2012, 11:16 AM    I have seen, examined the patient, and reviewed the above assessment and plan.  Changes to above are made where necessary.    Co Sign: Hillis Range, MD 03/13/2012 9:09 PM

## 2012-03-18 ENCOUNTER — Telehealth: Payer: Self-pay

## 2012-03-18 ENCOUNTER — Other Ambulatory Visit (INDEPENDENT_AMBULATORY_CARE_PROVIDER_SITE_OTHER): Payer: BC Managed Care – PPO

## 2012-03-18 ENCOUNTER — Encounter: Payer: Self-pay | Admitting: *Deleted

## 2012-03-18 DIAGNOSIS — Z7901 Long term (current) use of anticoagulants: Secondary | ICD-10-CM

## 2012-03-18 NOTE — Telephone Encounter (Signed)
faxed

## 2012-03-18 NOTE — Telephone Encounter (Signed)
Per Clydie Braun in Lab, pt will not be able to return to given another PT/INR specimen until Monday.

## 2012-03-18 NOTE — Telephone Encounter (Signed)
Returned call to [place], [place] at [employer] advised Tresa Endo, RN with Dr Ladona Ridgel will fax over return to work letter this afternoon as requested for pt.  Fax # is [phone].

## 2012-03-18 NOTE — Telephone Encounter (Signed)
[facility] Called requesting a copy of the patients last 2-3 PT/INR results.  Faxed results to [phone] attn. Brianna.

## 2012-03-18 NOTE — Telephone Encounter (Signed)
Lab called stating that there was insufficient sample for PT INR testing. Pt will need to be redrawn or have labs performed at [facility]. Colin Mulders PA at [facility] called requesting results of PT INR drawn today. Lab will contact both patient and center to advise of same.

## 2012-03-18 NOTE — Telephone Encounter (Signed)
Ok to return to work 03/16/12 with no restrictions.  Will fax note today

## 2012-03-18 NOTE — Telephone Encounter (Signed)
Clydie Braun in [facility] called back stating she advised pt and [facility] that pt will need a re-draw for PT INR ordered today. Judeth Cornfield at [facility] is requesting a standing order for PT INR for pt faxed to [phone]. She also states that fax previously sent by Robin with pt's PT INR reading for the last month was illegible. Lab results faxed, please advise on orders.

## 2012-03-18 NOTE — Telephone Encounter (Signed)
Ok for INR monday

## 2012-03-18 NOTE — Telephone Encounter (Signed)
Pt is being seen today in their office.  Colin Mulders, PA is calling requesting a return to work note with no restrictions for pt who had an ablation done by Dr Ladona Ridgel on 03/12/12.  She is requesting it be faxed to their office today.  Please return call to [place], [place].  Thanks

## 2012-03-19 NOTE — Telephone Encounter (Signed)
Faxed rx to [facility] and to [facility] lab as well.

## 2012-03-25 ENCOUNTER — Telehealth: Payer: Self-pay

## 2012-03-25 NOTE — Telephone Encounter (Signed)
The lab called to inform the patient did not get PT/INR done this week.. They spoke to the patient and he is in [place] this week and will not be back in [place] until late on Friday.  He stated he would come to the lab on Monday 03/31/12.

## 2012-03-25 NOTE — Telephone Encounter (Signed)
Note, next lab aug 19

## 2012-03-28 LAB — PROTIME-INR
INR: 1.3 ratio — ABNORMAL HIGH (ref 0.8–1.0)
Prothrombin Time: 14.4 s — ABNORMAL HIGH (ref 10.2–12.4)

## 2012-04-04 ENCOUNTER — Other Ambulatory Visit (INDEPENDENT_AMBULATORY_CARE_PROVIDER_SITE_OTHER): Payer: BC Managed Care – PPO

## 2012-04-04 DIAGNOSIS — I4892 Unspecified atrial flutter: Secondary | ICD-10-CM

## 2012-04-16 ENCOUNTER — Encounter: Payer: Self-pay | Admitting: Internal Medicine

## 2012-04-16 ENCOUNTER — Ambulatory Visit (INDEPENDENT_AMBULATORY_CARE_PROVIDER_SITE_OTHER): Payer: BC Managed Care – PPO | Admitting: Internal Medicine

## 2012-04-16 VITALS — BP 130/70 | HR 69 | Ht 72.0 in | Wt 217.4 lb

## 2012-04-16 DIAGNOSIS — I10 Essential (primary) hypertension: Secondary | ICD-10-CM

## 2012-04-16 DIAGNOSIS — I4892 Unspecified atrial flutter: Secondary | ICD-10-CM

## 2012-04-16 DIAGNOSIS — Z7901 Long term (current) use of anticoagulants: Secondary | ICD-10-CM

## 2012-04-16 DIAGNOSIS — I483 Typical atrial flutter: Secondary | ICD-10-CM

## 2012-04-16 NOTE — Assessment & Plan Note (Signed)
I recommended that he discontinue taking warfarin. The patient's risk score is 1

## 2012-04-16 NOTE — Patient Instructions (Signed)
Your physician recommends that you schedule a follow-up appointment in: as needed  Stop you warfarin and Start Aspirin 81 mg.

## 2012-04-16 NOTE — Assessment & Plan Note (Signed)
He is maintaining NSR. He can continue aspirin, discontinue warfarin, and followup as needed.

## 2012-04-16 NOTE — Assessment & Plan Note (Signed)
His blood pressure is well controlled. I've encouraged the patient to maintain a low-sodium diet.

## 2012-04-16 NOTE — Progress Notes (Signed)
HPI Mr. Mckeehan returns today for followup. He is a very pleasant 66 year old man with hypertension and atrial flutter who underwent catheter ablation several weeks ago. He has done well in the interim. He denies chest pain, shortness of breath, syncope, or palpitations. No peripheral edema. No Known Allergies   Current Outpatient Prescriptions  Medication Sig Dispense Refill  . hydrochlorothiazide (MICROZIDE) 12.5 MG capsule Take 1 capsule (12.5 mg total) by mouth daily.  30 capsule  11  . lisinopril (PRINIVIL,ZESTRIL) 5 MG tablet Take 1 tablet (5 mg total) by mouth daily.  90 tablet  3  . metoprolol succinate (TOPROL-XL) 100 MG 24 hr tablet Take 1 tablet (100 mg total) by mouth daily. Take with or immediately following a meal.  30 tablet  11  . Multiple Vitamin (MULTIVITAMIN) tablet Take 1 tablet by mouth daily.        . sildenafil (VIAGRA) 100 MG tablet Take 100 mg by mouth as needed.      . warfarin (COUMADIN) 5 MG tablet Take 5 mg by mouth daily.         Past Medical History  Diagnosis Date  . ERECTILE DYSFUNCTION 04/10/2007  . HYPERTENSION 04/10/2007  . Hyperlipemia 11/14/2010  . Warfarin anticoagulation 11/16/2011  . Atrial fibrillation, new onset 11/16/2011  . Flutter-fibrillation   . Exertional dyspnea     "related to my heart problems"    ROS:   All systems reviewed and negative except as noted in the HPI.   Past Surgical History  Procedure Date  . Radiofrequency ablation 03/12/12     Family History  Problem Relation Age of Onset  . Hypertension Mother   . Cancer Father     unsure what kind of cancer  . Hypertension Sister     twin     History   Social History  . Marital Status: Married    Spouse Name: N/A    Number of Children: 3  . Years of Education: N/A   Occupational History  . Truck Acupuncturist   Social History Main Topics  . Smoking status: Former Smoker -- 1.0 packs/day for 20 years    Types: Cigarettes    Quit date: 08/13/1988  .  Smokeless tobacco: Never Used   Comment: 03/12/12 "quit smoking in the 1990's"  . Alcohol Use: 0.6 oz/week    1 Cans of beer per week     03/12/12 "once in a blue moon I'll have glass of wine too"  . Drug Use: Yes    Special: Marijuana     03/12/12 "last marijuana was oversees; 1970's"  . Sexually Active: Yes   Other Topics Concern  . Not on file   Social History Narrative  . No narrative on file     BP 130/70  Pulse 69  Ht 6' (1.829 m)  Wt 217 lb 6.4 oz (98.612 kg)  BMI 29.48 kg/m2  Physical Exam:  Well appearing middle-aged man, NAD HEENT: Unremarkable Neck:  No JVD, no thyromegally Lungs:  Clear with no wheezes, rales, or rhonchi. HEART:  Regular rate rhythm, no murmurs, no rubs, no clicks Abd:  soft, positive bowel sounds, no organomegally, no rebound, no guarding Ext:  2 plus pulses, no edema, no cyanosis, no clubbing Skin:  No rashes no nodules Neuro:  CN II through XII intact, motor grossly intact  EKG Normal sinus rhythm with LVH  Assess/Plan:

## 2012-05-07 ENCOUNTER — Encounter: Payer: Self-pay | Admitting: Internal Medicine

## 2012-11-20 ENCOUNTER — Other Ambulatory Visit: Payer: Self-pay | Admitting: Internal Medicine

## 2012-11-21 ENCOUNTER — Ambulatory Visit (INDEPENDENT_AMBULATORY_CARE_PROVIDER_SITE_OTHER): Payer: Commercial Managed Care - PPO | Admitting: Internal Medicine

## 2012-11-21 ENCOUNTER — Other Ambulatory Visit (INDEPENDENT_AMBULATORY_CARE_PROVIDER_SITE_OTHER): Payer: Commercial Managed Care - PPO

## 2012-11-21 ENCOUNTER — Encounter: Payer: Self-pay | Admitting: Internal Medicine

## 2012-11-21 VITALS — BP 132/80 | HR 67 | Temp 97.1°F | Ht 72.0 in | Wt 217.2 lb

## 2012-11-21 DIAGNOSIS — Z0000 Encounter for general adult medical examination without abnormal findings: Secondary | ICD-10-CM

## 2012-11-21 LAB — HEPATIC FUNCTION PANEL
ALT: 21 U/L (ref 0–53)
AST: 22 U/L (ref 0–37)
Alkaline Phosphatase: 54 U/L (ref 39–117)
Bilirubin, Direct: 0 mg/dL (ref 0.0–0.3)
Total Bilirubin: 0.4 mg/dL (ref 0.3–1.2)

## 2012-11-21 LAB — BASIC METABOLIC PANEL
Chloride: 96 mEq/L (ref 96–112)
Potassium: 3.5 mEq/L (ref 3.5–5.1)
Sodium: 135 mEq/L (ref 135–145)

## 2012-11-21 LAB — CBC WITH DIFFERENTIAL/PLATELET
Basophils Relative: 0.1 % (ref 0.0–3.0)
Eosinophils Absolute: 0.1 10*3/uL (ref 0.0–0.7)
MCHC: 33.8 g/dL (ref 30.0–36.0)
MCV: 86.7 fl (ref 78.0–100.0)
Monocytes Absolute: 0.6 10*3/uL (ref 0.1–1.0)
Neutrophils Relative %: 62.3 % (ref 43.0–77.0)
Platelets: 279 10*3/uL (ref 150.0–400.0)
RBC: 4.85 Mil/uL (ref 4.22–5.81)
RDW: 13.9 % (ref 11.5–14.6)

## 2012-11-21 LAB — URINALYSIS, ROUTINE W REFLEX MICROSCOPIC
Ketones, ur: NEGATIVE
Leukocytes, UA: NEGATIVE
Nitrite: NEGATIVE
Specific Gravity, Urine: 1.02 (ref 1.000–1.030)
pH: 6.5 (ref 5.0–8.0)

## 2012-11-21 LAB — LIPID PANEL
LDL Cholesterol: 112 mg/dL — ABNORMAL HIGH (ref 0–99)
Total CHOL/HDL Ratio: 6

## 2012-11-21 LAB — TSH: TSH: 0.38 u[IU]/mL (ref 0.35–5.50)

## 2012-11-21 MED ORDER — HYDROCHLOROTHIAZIDE 12.5 MG PO CAPS: 12.5000 mg | ORAL_CAPSULE | Freq: Every day | ORAL | Status: DC

## 2012-11-21 MED ORDER — LISINOPRIL 5 MG PO TABS: ORAL_TABLET | ORAL | Status: DC

## 2012-11-21 MED ORDER — SILDENAFIL CITRATE 100 MG PO TABS: 100.0000 mg | ORAL_TABLET | ORAL | Status: DC | PRN

## 2012-11-21 MED ORDER — METOPROLOL SUCCINATE ER 100 MG PO TB24: 100.0000 mg | ORAL_TABLET | Freq: Every day | ORAL | Status: DC

## 2012-11-21 NOTE — Patient Instructions (Addendum)
Please continue all other medications as before, and refills have been done if requested. Please continue your efforts at being more active, low cholesterol diet, and weight control. You are otherwise up to date with prevention measures today. Please go to the LAB in the Basement (turn left off the elevator) for the tests to be done today You will be contacted by phone if any changes need to be made immediately.  Otherwise, you will receive a letter about your results with an explanation Please remember to sign up for My Chart if you have not done so, as this will be important to you in the future with finding out test results, communicating by private email, and scheduling acute appointments online when needed. Please return in 1 year for your yearly visit, or sooner if needed, with Lab testing done 3-5 days before

## 2012-11-21 NOTE — Assessment & Plan Note (Signed)

## 2012-11-21 NOTE — Progress Notes (Signed)
Subjective:    Patient ID: Marcus Boyd, male    DOB: 1946/04/09, 67 y.o.   MRN: [id number]  HPI  Here for wellness and f/u;  Overall doing ok;  Pt denies CP, worsening SOB, DOE, wheezing, orthopnea, PND, worsening LE edema, palpitations, dizziness or syncope.  Pt denies neurological change such as new headache, facial or extremity weakness.  Pt denies polydipsia, polyuria, or low sugar symptoms. Pt states overall good compliance with treatment and medications, good tolerability, and has been trying to follow lower cholesterol diet.  Pt denies worsening depressive symptoms, suicidal ideation or panic. No fever, night sweats, wt loss, loss of appetite, or other constitutional symptoms.  Pt states good ability with ADL's, has low fall risk, home safety reviewed and adequate, no other significant changes in hearing or vision, and only occasionally active with exercise.  No acute complaints Past Medical History  Diagnosis Date  . ERECTILE DYSFUNCTION 04/10/2007  . HYPERTENSION 04/10/2007  . Hyperlipemia 11/14/2010  . Warfarin anticoagulation 11/16/2011  . Atrial fibrillation, new onset 11/16/2011  . Flutter-fibrillation   . Exertional dyspnea     "related to my heart problems"   Past Surgical History  Procedure Laterality Date  . Radiofrequency ablation  03/12/12    reports that he quit smoking about 24 years ago. His smoking use included Cigarettes. He has a 20 pack-year smoking history. He has never used smokeless tobacco. He reports that he drinks about 0.6 ounces of alcohol per week. He reports that he uses illicit drugs (Marijuana). family history includes Cancer in his father and Hypertension in his mother and sister. No Known Allergies Current Outpatient Prescriptions on File Prior to Visit  Medication Sig Dispense Refill  . aspirin 81 MG tablet Take 81 mg by mouth daily.      . Multiple Vitamin (MULTIVITAMIN) tablet Take 1 tablet by mouth daily.         No current facility-administered  medications on file prior to visit.    Review of Systems Constitutional: Negative for diaphoresis, activity change, appetite change or unexpected weight change.  HENT: Negative for hearing loss, ear pain, facial swelling, mouth sores and neck stiffness.   Eyes: Negative for pain, redness and visual disturbance.  Respiratory: Negative for shortness of breath and wheezing.   Cardiovascular: Negative for chest pain and palpitations.  Gastrointestinal: Negative for diarrhea, blood in stool, abdominal distention or other pain Genitourinary: Negative for hematuria, flank pain or change in urine volume.  Musculoskeletal: Negative for myalgias and joint swelling.  Skin: Negative for color change and wound.  Neurological: Negative for syncope and numbness. other than noted Hematological: Negative for adenopathy.  Psychiatric/Behavioral: Negative for hallucinations, self-injury, decreased concentration and agitation.      Objective:   Physical Exam BP 132/80  Pulse 67  Temp(Src) 97.1 F (36.2 C) (Oral)  Ht 6' (1.829 m)  Wt 217 lb 4 oz (98.544 kg)  BMI 29.46 kg/m2  SpO2 99% VS noted,  Constitutional: Pt is oriented to person, place, and time. Appears well-developed and well-nourished.  Head: Normocephalic and atraumatic.  Right Ear: External ear normal.  Left Ear: External ear normal.  Nose: Nose normal.  Mouth/Throat: Oropharynx is clear and moist.  Eyes: Conjunctivae and EOM are normal. Pupils are equal, round, and reactive to light.  Neck: Normal range of motion. Neck supple. No JVD present. No tracheal deviation present.  Cardiovascular: Normal rate, regular rhythm, normal heart sounds and intact distal pulses.   Pulmonary/Chest: Effort normal and breath sounds normal.  Abdominal: Soft. Bowel sounds are normal. There is no tenderness. No HSM  Musculoskeletal: Normal range of motion. Exhibits no edema.  Lymphadenopathy:  Has no cervical adenopathy.  Neurological: Pt is alert and  oriented to person, place, and time. Pt has normal reflexes. No cranial nerve deficit.  Skin: Skin is warm and dry. No rash noted.  Psychiatric:  Has  normal mood and affect. Behavior is normal.     Assessment & Plan:

## 2012-11-24 ENCOUNTER — Encounter: Payer: Self-pay | Admitting: Internal Medicine

## 2013-03-17 ENCOUNTER — Telehealth: Payer: Self-pay | Admitting: Internal Medicine

## 2013-03-17 NOTE — Telephone Encounter (Signed)
I spoke with Colin Mulders, PA regarding the patient's diagnosis and medications. Will fax 03/12/12 echo to [phone].

## 2013-03-17 NOTE — Telephone Encounter (Signed)
New problem     Has question regarding patient history . Patient in the office now. DOT Physical .

## 2013-11-09 ENCOUNTER — Other Ambulatory Visit: Payer: Self-pay | Admitting: Internal Medicine

## 2013-11-27 ENCOUNTER — Encounter: Payer: Self-pay | Admitting: Internal Medicine

## 2013-11-27 ENCOUNTER — Ambulatory Visit (INDEPENDENT_AMBULATORY_CARE_PROVIDER_SITE_OTHER): Payer: Commercial Managed Care - PPO | Admitting: Internal Medicine

## 2013-11-27 ENCOUNTER — Other Ambulatory Visit (INDEPENDENT_AMBULATORY_CARE_PROVIDER_SITE_OTHER): Payer: Commercial Managed Care - PPO

## 2013-11-27 VITALS — BP 156/86 | HR 68 | Ht 70.5 in | Wt 222.5 lb

## 2013-11-27 DIAGNOSIS — E785 Hyperlipidemia, unspecified: Secondary | ICD-10-CM

## 2013-11-27 DIAGNOSIS — Z23 Encounter for immunization: Secondary | ICD-10-CM

## 2013-11-27 DIAGNOSIS — I10 Essential (primary) hypertension: Secondary | ICD-10-CM

## 2013-11-27 DIAGNOSIS — Z0000 Encounter for general adult medical examination without abnormal findings: Secondary | ICD-10-CM

## 2013-11-27 LAB — URINALYSIS, ROUTINE W REFLEX MICROSCOPIC
Bilirubin Urine: NEGATIVE
Hgb urine dipstick: NEGATIVE
KETONES UR: NEGATIVE
Leukocytes, UA: NEGATIVE
Nitrite: NEGATIVE
PH: 6.5 (ref 5.0–8.0)
SPECIFIC GRAVITY, URINE: 1.025 (ref 1.000–1.030)
TOTAL PROTEIN, URINE-UPE24: NEGATIVE
URINE GLUCOSE: NEGATIVE
Urobilinogen, UA: 0.2 (ref 0.0–1.0)

## 2013-11-27 LAB — HEPATIC FUNCTION PANEL
ALBUMIN: 3.6 g/dL (ref 3.5–5.2)
ALK PHOS: 56 U/L (ref 39–117)
ALT: 17 U/L (ref 0–53)
AST: 23 U/L (ref 0–37)
BILIRUBIN DIRECT: 0.1 mg/dL (ref 0.0–0.3)
BILIRUBIN TOTAL: 0.5 mg/dL (ref 0.3–1.2)
Total Protein: 7.3 g/dL (ref 6.0–8.3)

## 2013-11-27 LAB — TSH: TSH: 0.62 u[IU]/mL (ref 0.35–5.50)

## 2013-11-27 LAB — BASIC METABOLIC PANEL
BUN: 12 mg/dL (ref 6–23)
CALCIUM: 9.1 mg/dL (ref 8.4–10.5)
CHLORIDE: 104 meq/L (ref 96–112)
CO2: 27 mEq/L (ref 19–32)
Creatinine, Ser: 1.1 mg/dL (ref 0.4–1.5)
GFR: 86.93 mL/min (ref >60.00)
GLUCOSE: 90 mg/dL (ref 70–99)
POTASSIUM: 3.5 meq/L (ref 3.5–5.1)
SODIUM: 141 meq/L (ref 135–145)

## 2013-11-27 LAB — CBC WITH DIFFERENTIAL/PLATELET
Basophils Absolute: 0 K/uL (ref 0.0–0.1)
Basophils Relative: 0.5 % (ref 0.0–3.0)
Eosinophils Absolute: 0.1 K/uL (ref 0.0–0.7)
Eosinophils Relative: 1.6 % (ref 0.0–5.0)
HCT: 42.4 % (ref 39.0–52.0)
Hemoglobin: 13.9 g/dL (ref 13.0–17.0)
Lymphocytes Relative: 30.7 % (ref 12.0–46.0)
Lymphs Abs: 2.5 K/uL (ref 0.7–4.0)
MCHC: 32.8 g/dL (ref 30.0–36.0)
MCV: 88.1 fl (ref 78.0–100.0)
Monocytes Absolute: 0.7 K/uL (ref 0.1–1.0)
Monocytes Relative: 8.2 % (ref 3.0–12.0)
Neutro Abs: 4.9 K/uL (ref 1.4–7.7)
Neutrophils Relative %: 59 % (ref 43.0–77.0)
Platelets: 279 K/uL (ref 150.0–400.0)
RBC: 4.81 Mil/uL (ref 4.22–5.81)
RDW: 13.9 % (ref 11.5–14.6)
WBC: 8.3 K/uL (ref 4.5–10.5)

## 2013-11-27 LAB — LIPID PANEL
Cholesterol: 173 mg/dL (ref 0–200)
HDL: 30.5 mg/dL — ABNORMAL LOW (ref >39.00)
LDL Cholesterol: 96 mg/dL (ref 0–99)
Total CHOL/HDL Ratio: 6
Triglycerides: 234 mg/dL — ABNORMAL HIGH (ref 0.0–149.0)
VLDL: 46.8 mg/dL — ABNORMAL HIGH (ref 0.0–40.0)

## 2013-11-27 MED ORDER — HYDROCHLOROTHIAZIDE 12.5 MG PO CAPS: ORAL_CAPSULE | ORAL | Status: DC

## 2013-11-27 MED ORDER — METOPROLOL SUCCINATE ER 100 MG PO TB24: ORAL_TABLET | ORAL | Status: DC

## 2013-11-27 MED ORDER — ATORVASTATIN CALCIUM 10 MG PO TABS: 10.0000 mg | ORAL_TABLET | Freq: Every day | ORAL | Status: DC

## 2013-11-27 MED ORDER — LISINOPRIL 20 MG PO TABS: 20.0000 mg | ORAL_TABLET | Freq: Every day | ORAL | Status: DC

## 2013-11-27 NOTE — Patient Instructions (Addendum)
You had the new Prevnar pneumonia shot today  OK to increase the lisinopril to 20 mg per day  Please take all new medication as prescribed  - the low dose generic lipitor  Please continue all other medications as before, and all refills have been done . Please have the pharmacy call with any other refills you may need.  Please continue your efforts at being more active, low cholesterol diet, and weight control. You are otherwise up to date with prevention measures today.  Please keep your appointments with your specialists as you may have planned  Please go to the LAB in the Basement (turn left off the elevator) for the tests to be done today  You will be contacted by phone if any changes need to be made immediately.  Otherwise, you will receive a letter about your results with an explanation, but please check with MyChart first.  Please remember to sign up for MyChart if you have not done so, as this will be important to you in the future with finding out test results, communicating by private email, and scheduling acute appointments online when needed.  Please return in 6 months, or sooner if needed

## 2013-11-27 NOTE — Progress Notes (Signed)
Subjective:    Patient ID: Marcus Boyd, male    DOB: 1946-04-02, 68 y.o.   MRN: [id number]  HPI  Here for wellness and f/u;  Overall doing ok;  Pt denies CP, worsening SOB, DOE, wheezing, orthopnea, PND, worsening LE edema, palpitations, dizziness or syncope.  Pt denies neurological change such as new headache, facial or extremity weakness.  Pt denies polydipsia, polyuria, or low sugar symptoms. Pt states overall good compliance with treatment and medications, good tolerability, and has been trying to follow lower cholesterol diet.  Pt denies worsening depressive symptoms, suicidal ideation or panic. No fever, night sweats, wt loss, loss of appetite, or other constitutional symptoms.  Pt states good ability with ADL's, has low fall risk, home safety reviewed and adequate, no other significant changes in hearing or vision, and only occasionally active with exercise. 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 ERECTILE DYSFUNCTION 04/10/2007  . HYPERTENSION 04/10/2007  . Hyperlipemia 11/14/2010  . Warfarin anticoagulation 11/16/2011  . Atrial fibrillation, new onset 11/16/2011  . Flutter-fibrillation   . Exertional dyspnea     "related to my heart problems"   Past Surgical History  Procedure Laterality Date  . Radiofrequency ablation  03/12/12    reports that he quit smoking about 25 years ago. His smoking use included Cigarettes. He has a 20 pack-year smoking history. He has never used smokeless tobacco. He reports that he drinks about .6 ounces of alcohol per week. He reports that he uses illicit drugs (Marijuana). family history includes Cancer in his father; Hypertension in his mother and sister. No Known Allergies Current Outpatient Prescriptions on File Prior to Visit  Medication Sig Dispense Refill  . aspirin 81 MG tablet Take 81 mg by mouth daily.      . Multiple Vitamin (MULTIVITAMIN) tablet Take 1 tablet by mouth daily.          . sildenafil (VIAGRA) 100 MG tablet Take 1 tablet (100 mg total) by mouth as needed.  10 tablet  11   No current facility-administered medications on file prior to visit.    Review of Systems Constitutional: Negative for diaphoresis, activity change, appetite change or unexpected weight change.  HENT: Negative for hearing loss, ear pain, facial swelling, mouth sores and neck stiffness.   Eyes: Negative for pain, redness and visual disturbance.  Respiratory: Negative for shortness of breath and wheezing.   Cardiovascular: Negative for chest pain and palpitations.  Gastrointestinal: Negative for diarrhea, blood in stool, abdominal distention or other pain Genitourinary: Negative for hematuria, flank pain or change in urine volume.  Musculoskeletal: Negative for myalgias and joint swelling.  Skin: Negative for color change and wound.  Neurological: Negative for syncope and numbness. other than noted Hematological: Negative for adenopathy.  Psychiatric/Behavioral: Negative for hallucinations, self-injury, decreased concentration and agitation.      Objective:   Physical Exam BP 156/86  Pulse 68  Ht 5' 10.5" (1.791 m)  Wt 222 lb 8 oz (100.925 kg)  BMI 31.46 kg/m2 VS noted,  Constitutional: Pt is oriented to person, place, and time. Appears well-developed and well-nourished.  Head: Normocephalic and atraumatic.  Right Ear: External ear normal.  Left Ear: External ear normal.  Nose: Nose normal.  Mouth/Throat: Oropharynx is clear and moist.  Eyes: Conjunctivae and EOM are normal. Pupils are equal, round, and reactive to light.  Neck: Normal range of motion. Neck supple. No JVD present. No tracheal deviation present.  Cardiovascular: Normal rate, regular rhythm, normal heart sounds and intact distal pulses.   Pulmonary/Chest: Effort normal and breath sounds normal.  Abdominal: Soft. Bowel sounds are normal. There is no tenderness. No HSM  Musculoskeletal: Normal range of motion.  Exhibits no edema.  Lymphadenopathy:  Has no cervical adenopathy.  Neurological: Pt is alert and oriented to person, place, and time. Pt has normal reflexes. No cranial nerve deficit.  Skin: Skin is warm and dry. No rash noted.  Psychiatric:  Has  normal mood and affect. Behavior is normal.     Assessment & Plan:

## 2013-11-27 NOTE — Assessment & Plan Note (Signed)
Mild, goal lld < 100, to start lipitor 10 qd

## 2013-11-27 NOTE — Assessment & Plan Note (Signed)
Uncontrolled, to incr the zestril to 20 qd

## 2013-11-27 NOTE — Addendum Note (Signed)
Addended by: Royann Shivers A on: 11/27/2013 01:42 PM   Modules accepted: Orders

## 2013-11-27 NOTE — Assessment & Plan Note (Signed)

## 2014-01-20 ENCOUNTER — Other Ambulatory Visit: Payer: Self-pay | Admitting: Internal Medicine

## 2014-05-28 ENCOUNTER — Encounter: Payer: Self-pay | Admitting: Internal Medicine

## 2014-05-28 ENCOUNTER — Ambulatory Visit (INDEPENDENT_AMBULATORY_CARE_PROVIDER_SITE_OTHER): Payer: Commercial Managed Care - PPO | Admitting: Internal Medicine

## 2014-05-28 VITALS — BP 160/90 | HR 66 | Temp 97.9°F | Wt 217.0 lb

## 2014-05-28 DIAGNOSIS — I10 Essential (primary) hypertension: Secondary | ICD-10-CM

## 2014-05-28 DIAGNOSIS — E785 Hyperlipidemia, unspecified: Secondary | ICD-10-CM

## 2014-05-28 DIAGNOSIS — Z0000 Encounter for general adult medical examination without abnormal findings: Secondary | ICD-10-CM

## 2014-05-28 DIAGNOSIS — Z0189 Encounter for other specified special examinations: Secondary | ICD-10-CM

## 2014-05-28 MED ORDER — LISINOPRIL 40 MG PO TABS: 40.0000 mg | ORAL_TABLET | Freq: Every day | ORAL | Status: DC

## 2014-05-28 NOTE — Assessment & Plan Note (Signed)
stable overall by history and exam, recent data reviewed with pt, and pt to continue medical treatment as before,  to f/u any worsening symptoms or concerns Lab Results  Component Value Date   LDLCALC 96 11/27/2013

## 2014-05-28 NOTE — Assessment & Plan Note (Addendum)
Mild uncontrollled, to incr the lisinopril to 40 mg qd,f/u BP at home and next visit, cont wt loss efforts, low salt, regular excercise

## 2014-05-28 NOTE — Progress Notes (Signed)
Pre visit review using our clinic review tool, if applicable. No additional management support is needed unless otherwise documented below in the visit note. 

## 2014-05-28 NOTE — Progress Notes (Signed)
Subjective:    Patient ID: Marcus Boyd, male    DOB: 1945/12/26, 68 y.o.   MRN: [id number]  HPI   Here to f/u; overall doing ok,  Pt denies chest pain, increased sob or doe, wheezing, orthopnea, PND, increased LE swelling, palpitations, dizziness or syncope.  Pt denies polydipsia, polyuria, or low sugar symptoms such as weakness or confusion improved with po intake.  Pt denies new neurological symptoms such as new headache, or facial or extremity weakness or numbness.   Pt states overall good compliance with meds, has been trying to follow lower cholesterol diet, with wt overall stable,  Does do exercise near daily, none for 2 days.  BP Readings from Last 3 Encounters:  05/28/14 160/90  11/27/13 156/86  11/21/12 132/80   Past Medical History  Diagnosis Date  . ERECTILE DYSFUNCTION 04/10/2007  . HYPERTENSION 04/10/2007  . Hyperlipemia 11/14/2010  . Warfarin anticoagulation 11/16/2011  . Atrial fibrillation, new onset 11/16/2011  . Flutter-fibrillation   . Exertional dyspnea     "related to my heart problems"   Past Surgical History  Procedure Laterality Date  . Radiofrequency ablation  03/12/12    reports that he quit smoking about 25 years ago. His smoking use included Cigarettes. He has a 20 pack-year smoking history. He has never used smokeless tobacco. He reports that he drinks about .6 ounces of alcohol per week. He reports that he uses illicit drugs (Marijuana). family history includes Cancer in his father; Hypertension in his mother and sister. No Known Allergies Current Outpatient Prescriptions on File Prior to Visit  Medication Sig Dispense Refill  . aspirin 81 MG tablet Take 81 mg by mouth daily.      Marland Kitchen atorvastatin (LIPITOR) 10 MG tablet Take 1 tablet (10 mg total) by mouth daily.  90 tablet  3  . hydrochlorothiazide (MICROZIDE) 12.5 MG capsule TAKE ONE CAPSULE BY MOUTH EVERY DAY  90 capsule  3  . hydrochlorothiazide (MICROZIDE) 12.5 MG capsule TAKE ONE CAPSULE BY MOUTH  EVERY DAY  30 capsule  11  . lisinopril (PRINIVIL,ZESTRIL) 20 MG tablet Take 1 tablet (20 mg total) by mouth daily.  90 tablet  3  . lisinopril (PRINIVIL,ZESTRIL) 5 MG tablet TAKE 1 TABLET BY MOUTH EVERY DAY  30 tablet  11  . metoprolol succinate (TOPROL-XL) 100 MG 24 hr tablet TAKE 1 TABLET (100 MG TOTAL) BY MOUTH DAILY. TAKE WITH OR IMMEDIATELY FOLLOWING A MEAL.  90 tablet  3  . metoprolol succinate (TOPROL-XL) 100 MG 24 hr tablet TAKE 1 TABLET BY MOUTH EVERY DAY WITH FOOD OR IMMEDIATELY AFTER  30 tablet  11  . Multiple Vitamin (MULTIVITAMIN) tablet Take 1 tablet by mouth daily.        . sildenafil (VIAGRA) 100 MG tablet Take 1 tablet (100 mg total) by mouth as needed.  10 tablet  11   No current facility-administered medications on file prior to visit.    Review of Systems  All otherwise neg per pt      Objective:   Physical Exam BP 160/90  Pulse 66  Temp(Src) 97.9 F (36.6 C)  Wt 217 lb (98.431 kg)  SpO2 97% VS noted,  Constitutional: Pt appears well-developed, well-nourished.  HENT: Head: NCAT.  Right Ear: External ear normal.  Left Ear: External ear normal.  Eyes: . Pupils are equal, round, and reactive to light. Conjunctivae and EOM are normal Neck: Normal range of motion. Neck supple.  Cardiovascular: Normal rate and regular rhythm.   Pulmonary/Chest:  Effort normal and breath sounds normal.  Abd:  Soft, NT, ND, + BS Neurological: Pt is alert. Not confused , motor grossly intact Skin: Skin is warm. No rash Psychiatric: Pt behavior is normal. No agitation.  Wt Readings from Last 3 Encounters:  05/28/14 217 lb (98.431 kg)  11/27/13 222 lb 8 oz (100.925 kg)  11/21/12 217 lb 4 oz (98.544 kg)       Assessment & Plan:

## 2014-05-28 NOTE — Patient Instructions (Signed)
OK to change the lisinopril to 40 mg per day (a new prescription was sent)  OK to stop the Lisnopril 5 mg, and the Lisinopril 20 mg (although you could take enough to make 40 mg and use them up)  Please continue all other medications as before, and refills have been done if requested.  Please have the pharmacy call with any other refills you may need.  Please keep your appointments with your specialists as you may have planned  Please return in 6 months, or sooner if needed, with Lab testing done 3-5 days before

## 2014-05-31 ENCOUNTER — Telehealth: Payer: Self-pay | Admitting: Internal Medicine

## 2014-05-31 NOTE — Telephone Encounter (Signed)
emmi mailed  °

## 2014-07-22 ENCOUNTER — Encounter (HOSPITAL_COMMUNITY): Payer: Self-pay | Admitting: Internal Medicine

## 2014-11-11 ENCOUNTER — Other Ambulatory Visit: Payer: Self-pay | Admitting: Internal Medicine

## 2014-12-02 ENCOUNTER — Ambulatory Visit (INDEPENDENT_AMBULATORY_CARE_PROVIDER_SITE_OTHER): Payer: Commercial Managed Care - PPO | Admitting: Internal Medicine

## 2014-12-02 ENCOUNTER — Encounter: Payer: Self-pay | Admitting: Internal Medicine

## 2014-12-02 ENCOUNTER — Other Ambulatory Visit (INDEPENDENT_AMBULATORY_CARE_PROVIDER_SITE_OTHER): Payer: Commercial Managed Care - PPO

## 2014-12-02 VITALS — BP 136/84 | HR 79 | Temp 98.9°F | Resp 18 | Ht 71.0 in | Wt 218.0 lb

## 2014-12-02 DIAGNOSIS — Z0000 Encounter for general adult medical examination without abnormal findings: Secondary | ICD-10-CM

## 2014-12-02 LAB — CBC WITH DIFFERENTIAL/PLATELET
BASOS PCT: 0.3 % (ref 0.0–3.0)
Basophils Absolute: 0 10*3/uL (ref 0.0–0.1)
EOS PCT: 1.3 % (ref 0.0–5.0)
Eosinophils Absolute: 0.1 10*3/uL (ref 0.0–0.7)
HEMATOCRIT: 44.4 % (ref 39.0–52.0)
HEMOGLOBIN: 14.9 g/dL (ref 13.0–17.0)
LYMPHS ABS: 2 10*3/uL (ref 0.7–4.0)
Lymphocytes Relative: 25.8 % (ref 12.0–46.0)
MCHC: 33.6 g/dL (ref 30.0–36.0)
MCV: 85.6 fl (ref 78.0–100.0)
Monocytes Absolute: 0.6 10*3/uL (ref 0.1–1.0)
Monocytes Relative: 8.4 % (ref 3.0–12.0)
Neutro Abs: 4.9 10*3/uL (ref 1.4–7.7)
Neutrophils Relative %: 64.2 % (ref 43.0–77.0)
PLATELETS: 304 10*3/uL (ref 150.0–400.0)
RBC: 5.19 Mil/uL (ref 4.22–5.81)
RDW: 13.9 % (ref 11.5–15.5)
WBC: 7.7 10*3/uL (ref 4.0–10.5)

## 2014-12-02 LAB — LIPID PANEL
CHOL/HDL RATIO: 4
Cholesterol: 135 mg/dL (ref 0–200)
HDL: 36.9 mg/dL — ABNORMAL LOW (ref >39.00)
LDL Cholesterol: 77 mg/dL (ref 0–99)
NonHDL: 98.1
TRIGLYCERIDES: 105 mg/dL (ref 0.0–149.0)
VLDL: 21 mg/dL (ref 0.0–40.0)

## 2014-12-02 LAB — URINALYSIS, ROUTINE W REFLEX MICROSCOPIC
Bilirubin Urine: NEGATIVE
Hgb urine dipstick: NEGATIVE
KETONES UR: NEGATIVE
LEUKOCYTES UA: NEGATIVE
NITRITE: NEGATIVE
SPECIFIC GRAVITY, URINE: 1.02 (ref 1.000–1.030)
Total Protein, Urine: NEGATIVE
URINE GLUCOSE: NEGATIVE
Urobilinogen, UA: 0.2 (ref 0.0–1.0)
pH: 7 (ref 5.0–8.0)

## 2014-12-02 LAB — BASIC METABOLIC PANEL
BUN: 12 mg/dL (ref 6–23)
CHLORIDE: 101 meq/L (ref 96–112)
CO2: 33 mEq/L — ABNORMAL HIGH (ref 19–32)
Calcium: 9.7 mg/dL (ref 8.4–10.5)
Creatinine, Ser: 1.15 mg/dL (ref 0.40–1.50)
GFR: 81.47 mL/min (ref >60.00)
Glucose, Bld: 94 mg/dL (ref 70–99)
POTASSIUM: 3.9 meq/L (ref 3.5–5.1)
Sodium: 144 mEq/L (ref 135–145)

## 2014-12-02 LAB — TSH: TSH: 0.61 u[IU]/mL (ref 0.35–4.50)

## 2014-12-02 LAB — HEPATIC FUNCTION PANEL
ALBUMIN: 4.2 g/dL (ref 3.5–5.2)
ALK PHOS: 64 U/L (ref 39–117)
ALT: 18 U/L (ref 0–53)
AST: 23 U/L (ref 0–37)
Bilirubin, Direct: 0.1 mg/dL (ref 0.0–0.3)
Total Bilirubin: 0.5 mg/dL (ref 0.2–1.2)
Total Protein: 7.8 g/dL (ref 6.0–8.3)

## 2014-12-02 LAB — PSA: PSA: 2.51 ng/mL (ref 0.10–4.00)

## 2014-12-02 NOTE — Progress Notes (Signed)
Pre visit review using our clinic review tool, if applicable. No additional management support is needed unless otherwise documented below in the visit note. 

## 2014-12-02 NOTE — Assessment & Plan Note (Signed)

## 2014-12-02 NOTE — Progress Notes (Signed)
Subjective:    Patient ID: Marcus Boyd, male    DOB: 02-Jan-1946, 69 y.o.   MRN: [id number]  HPI   Here for wellness and f/u;  Overall doing ok;  Pt denies Chest pain, worsening SOB, DOE, wheezing, orthopnea, PND, worsening LE edema, palpitations, dizziness or syncope.  Pt denies neurological change such as new headache, facial or extremity weakness.  Pt denies polydipsia, polyuria, or low sugar symptoms. Pt states overall good compliance with treatment and medications, good tolerability, and has been trying to follow appropriate diet.  Pt denies worsening depressive symptoms, suicidal ideation or panic. No fever, night sweats, wt loss, loss of appetite, or other constitutional symptoms.  Pt states good ability with ADL's, has low fall risk, home safety reviewed and adequate, no other significant changes in hearing or vision, and only occasionally active with exercise. No current complaints Past Medical History  Diagnosis Date  . ERECTILE DYSFUNCTION 04/10/2007  . HYPERTENSION 04/10/2007  . Hyperlipemia 11/14/2010  . Warfarin anticoagulation 11/16/2011  . Atrial fibrillation, new onset 11/16/2011  . Flutter-fibrillation   . Exertional dyspnea     "related to my heart problems"   Past Surgical History  Procedure Laterality Date  . Radiofrequency ablation  03/12/12  . Atrial flutter ablation N/A 03/12/2012    Procedure: ATRIAL FLUTTER ABLATION;  Surgeon: Evans Lance, MD;  Location: [facility] CATH LAB;  Service: Cardiovascular;  Laterality: N/A;    reports that he quit smoking about 26 years ago. His smoking use included Cigarettes. He has a 20 pack-year smoking history. He has never used smokeless tobacco. He reports that he drinks about 0.6 oz of alcohol per week. He reports that he uses illicit drugs (Marijuana). family history includes Cancer in his father; Hypertension in his mother and sister. No Known Allergies Current Outpatient Prescriptions on File Prior to Visit  Medication Sig Dispense  Refill  . aspirin 81 MG tablet Take 81 mg by mouth daily.    Marland Kitchen atorvastatin (LIPITOR) 10 MG tablet TAKE 1 TABLET (10 MG TOTAL) BY MOUTH DAILY. 90 tablet 2  . hydrochlorothiazide (MICROZIDE) 12.5 MG capsule TAKE ONE CAPSULE BY MOUTH EVERY DAY 90 capsule 3  . lisinopril (PRINIVIL,ZESTRIL) 40 MG tablet Take 1 tablet (40 mg total) by mouth daily. 90 tablet 3  . metoprolol succinate (TOPROL-XL) 100 MG 24 hr tablet TAKE 1 TABLET (100 MG TOTAL) BY MOUTH DAILY. TAKE WITH OR IMMEDIATELY FOLLOWING A MEAL. 90 tablet 3  . Multiple Vitamin (MULTIVITAMIN) tablet Take 1 tablet by mouth daily.      . sildenafil (VIAGRA) 100 MG tablet Take 1 tablet (100 mg total) by mouth as needed. 10 tablet 11   No current facility-administered medications on file prior to visit.   Review of Systems Constitutional: Negative for increased diaphoresis, other activity, appetite or siginficant weight change other than noted HENT: Negative for worsening hearing loss, ear pain, facial swelling, mouth sores and neck stiffness.   Eyes: Negative for other worsening pain, redness or visual disturbance.  Respiratory: Negative for shortness of breath and wheezing  Cardiovascular: Negative for chest pain and palpitations.  Gastrointestinal: Negative for diarrhea, blood in stool, abdominal distention or other pain Genitourinary: Negative for hematuria, flank pain or change in urine volume.  Musculoskeletal: Negative for myalgias or other joint complaints.  Skin: Negative for color change and wound or drainage.  Neurological: Negative for syncope and numbness. other than noted Hematological: Negative for adenopathy. or other swelling Psychiatric/Behavioral: Negative for hallucinations, SI, self-injury, decreased concentration  or other worsening agitation.      Objective:   Physical Exam BP 136/84 mmHg  Pulse 79  Temp(Src) 98.9 F (37.2 C) (Oral)  Resp 18  Ht 5\' 11"  (1.803 m)  Wt 218 lb (98.884 kg)  BMI 30.42 kg/m2  SpO2  95% VS noted,  Constitutional: Pt is oriented to person, place, and time. Appears well-developed and well-nourished, in no significant distress Head: Normocephalic and atraumatic.  Right Ear: External ear normal.  Left Ear: External ear normal.  Nose: Nose normal.  Mouth/Throat: Oropharynx is clear and moist.  Eyes: Conjunctivae and EOM are normal. Pupils are equal, round, and reactive to light.  Neck: Normal range of motion. Neck supple. No JVD present. No tracheal deviation present or significant neck LA or mass Cardiovascular: Normal rate, regular rhythm, normal heart sounds and intact distal pulses.   Pulmonary/Chest: Effort normal and breath sounds without rales or wheezing  Abdominal: Soft. Bowel sounds are normal. NT. No HSM  Musculoskeletal: Normal range of motion. Exhibits no edema.  Lymphadenopathy:  Has no cervical adenopathy.  Neurological: Pt is alert and oriented to person, place, and time. Pt has normal reflexes. No cranial nerve deficit. Motor grossly intact Skin: Skin is warm and dry. No rash noted.  Psychiatric:  Has normal mood and affect. Behavior is normal.     Assessment & Plan:

## 2014-12-02 NOTE — Patient Instructions (Addendum)
You had the Pneumovax pneumonia shot today  Please continue all other medications as before, and refills have been done if requested.  Please have the pharmacy call with any other refills you may need.  Please continue your efforts at being more active, low cholesterol diet, and weight control.  You are otherwise up to date with prevention measures today.  Please keep your appointments with your specialists as you may have planned  Please return in 1 year for your yearly visit, or sooner if needed, with Lab testing done 3-5 days before  

## 2015-02-04 ENCOUNTER — Other Ambulatory Visit: Payer: Self-pay | Admitting: Internal Medicine

## 2015-02-28 ENCOUNTER — Encounter: Payer: Self-pay | Admitting: Internal Medicine

## 2015-03-10 ENCOUNTER — Encounter: Payer: Self-pay | Admitting: Internal Medicine

## 2015-03-10 ENCOUNTER — Ambulatory Visit (INDEPENDENT_AMBULATORY_CARE_PROVIDER_SITE_OTHER): Payer: Commercial Managed Care - PPO | Admitting: Internal Medicine

## 2015-03-10 VITALS — BP 162/96 | HR 68 | Temp 97.7°F | Ht 72.0 in | Wt 218.0 lb

## 2015-03-10 DIAGNOSIS — I483 Typical atrial flutter: Secondary | ICD-10-CM

## 2015-03-10 DIAGNOSIS — I10 Essential (primary) hypertension: Secondary | ICD-10-CM | POA: Diagnosis not present

## 2015-03-10 DIAGNOSIS — E785 Hyperlipidemia, unspecified: Secondary | ICD-10-CM | POA: Diagnosis not present

## 2015-03-10 MED ORDER — AMLODIPINE BESYLATE 5 MG PO TABS: 5.0000 mg | ORAL_TABLET | Freq: Every day | ORAL | Status: DC

## 2015-03-10 MED ORDER — HYDROCHLOROTHIAZIDE 25 MG PO TABS: 25.0000 mg | ORAL_TABLET | Freq: Every day | ORAL | Status: DC

## 2015-03-10 NOTE — Assessment & Plan Note (Signed)
stable overall by history and exam, recent data reviewed with pt, and pt to continue medical treatment as before,  to f/u any worsening symptoms or concerns Lab Results  Component Value Date   LDLCALC 77 12/02/2014   To cont low chol diet

## 2015-03-10 NOTE — Patient Instructions (Signed)
Please take all new medication as prescribed  - the amlodipine 5 mg per day  OK to increase the HCT fluid pill to 25 mg per day  Please continue all other medications as before, and refills have been done if requested.  Please have the pharmacy call with any other refills you may need.  Please continue your efforts at being more active, low cholesterol diet, and weight control.  Please keep your appointments with your specialists as you may have planned

## 2015-03-10 NOTE — Assessment & Plan Note (Signed)
BP Readings from Last 3 Encounters:  03/10/15 162/96  12/02/14 136/84  05/28/14 160/90   Mild to mod uncontrolled, to add amlod 5 qd, incresae hct to 25 qd, cont all other meds,  to f/u any worsening symptoms or concerns, has appt for f/u [facility] BP exam next wk

## 2015-03-10 NOTE — Assessment & Plan Note (Signed)
Asympt, cont current meds,  to f/u any worsening symptoms or concerns

## 2015-03-10 NOTE — Progress Notes (Signed)
Pre visit review using our clinic review tool, if applicable. No additional management support is needed unless otherwise documented below in the visit note. 

## 2015-03-10 NOTE — Progress Notes (Signed)
Subjective:    Patient ID: Marcus Boyd, male    DOB: Oct 22, 1945, 69 y.o.   MRN: [id number]  HPI  Here to f/u; overall doing ok,  Pt denies chest pain, increasing sob or doe, wheezing, orthopnea, PND, increased LE swelling, palpitations, dizziness or syncope.  Pt denies new neurological symptoms such as new headache, or facial or extremity weakness or numbness.  Pt denies polydipsia, polyuria, or low sugar episode.   Pt denies new neurological symptoms such as new headache, or facial or extremity weakness or numbness.   Pt states overall good compliance with meds, mostly trying to follow appropriate diet, with wt overall stable,  but little exercise however.BP at [facility] yesterdya similar to today despite no recent wt gain, diet change and good med compliance.   BP Readings from Last 3 Encounters:  03/10/15 162/96  12/02/14 136/84  05/28/14 160/90   Wt Readings from Last 3 Encounters:  03/10/15 218 lb (98.884 kg)  12/02/14 218 lb (98.884 kg)  05/28/14 217 lb (98.431 kg)   Past Medical History  Diagnosis Date  . ERECTILE DYSFUNCTION 04/10/2007  . HYPERTENSION 04/10/2007  . Hyperlipemia 11/14/2010  . Warfarin anticoagulation 11/16/2011  . Atrial fibrillation, new onset 11/16/2011  . Flutter-fibrillation   . Exertional dyspnea     "related to my heart problems"   Past Surgical History  Procedure Laterality Date  . Radiofrequency ablation  03/12/12  . Atrial flutter ablation N/A 03/12/2012    Procedure: ATRIAL FLUTTER ABLATION;  Surgeon: Evans Lance, MD;  Location: [facility] CATH LAB;  Service: Cardiovascular;  Laterality: N/A;    reports that he quit smoking about 26 years ago. His smoking use included Cigarettes. He has a 20 pack-year smoking history. He has never used smokeless tobacco. He reports that he drinks about 0.6 oz of alcohol per week. He reports that he uses illicit drugs (Marijuana). family history includes Cancer in his father; Hypertension in his mother and sister. No Known  Allergies Current Outpatient Prescriptions on File Prior to Visit  Medication Sig Dispense Refill  . aspirin 81 MG tablet Take 81 mg by mouth daily.    Marland Kitchen atorvastatin (LIPITOR) 10 MG tablet TAKE 1 TABLET (10 MG TOTAL) BY MOUTH DAILY. 90 tablet 2  . lisinopril (PRINIVIL,ZESTRIL) 40 MG tablet Take 1 tablet (40 mg total) by mouth daily. 90 tablet 3  . metoprolol succinate (TOPROL-XL) 100 MG 24 hr tablet TAKE 1 TABLET BY MOUTH EVERY DAY WITH FOOD OR IMMEDIATELY AFTER 30 tablet 1  . Multiple Vitamin (MULTIVITAMIN) tablet Take 1 tablet by mouth daily.      . sildenafil (VIAGRA) 100 MG tablet Take 1 tablet (100 mg total) by mouth as needed. 10 tablet 11   No current facility-administered medications on file prior to visit.   Review of Systems  Constitutional: Negative for unusual diaphoresis or night sweats HENT: Negative for ringing in ear or discharge Eyes: Negative for double vision or worsening visual disturbance.  Respiratory: Negative for choking and stridor.   Gastrointestinal: Negative for vomiting or other signifcant bowel change Genitourinary: Negative for hematuria or change in urine volume.  Musculoskeletal: Negative for other MSK pain or swelling Skin: Negative for color change and worsening wound.  Neurological: Negative for tremors and numbness other than noted  Psychiatric/Behavioral: Negative for decreased concentration or agitation other than above       Objective:   Physical Exam BP 162/96 mmHg  Pulse 68  Temp(Src) 97.7 F (36.5 C) (Oral)  Ht 6' (  1.829 m)  Wt 218 lb (98.884 kg)  BMI 29.56 kg/m2  SpO2 97% VS noted,  Constitutional: Pt appears in no significant distress HENT: Head: NCAT.  Right Ear: External ear normal.  Left Ear: External ear normal.  Eyes: . Pupils are equal, round, and reactive to light. Conjunctivae and EOM are normal Neck: Normal range of motion. Neck supple.  Cardiovascular: Normal rate and regular rhythm.   Pulmonary/Chest: Effort normal  and breath sounds without rales or wheezing.  Neurological: Pt is alert. Not confused , motor grossly intact Skin: Skin is warm. No rash, no LE edema Psychiatric: Pt behavior is normal. No agitation.     Assessment & Plan:

## 2015-03-16 ENCOUNTER — Ambulatory Visit (INDEPENDENT_AMBULATORY_CARE_PROVIDER_SITE_OTHER): Payer: Commercial Managed Care - PPO | Admitting: Internal Medicine

## 2015-03-16 ENCOUNTER — Encounter: Payer: Self-pay | Admitting: Internal Medicine

## 2015-03-16 VITALS — BP 162/82 | HR 82 | Temp 97.8°F | Ht 72.0 in | Wt 217.0 lb

## 2015-03-16 DIAGNOSIS — I10 Essential (primary) hypertension: Secondary | ICD-10-CM | POA: Diagnosis not present

## 2015-03-16 MED ORDER — AMLODIPINE BESYLATE 10 MG PO TABS: 10.0000 mg | ORAL_TABLET | Freq: Every day | ORAL | Status: DC

## 2015-03-16 NOTE — Assessment & Plan Note (Signed)
Uncontrolledo on 4 oral meds, ok to increase the amlod to 10 qd, cont all other meds, f/u BP at home and next visit,  to f/u any worsening symptoms or concerns BP Readings from Last 3 Encounters:  03/16/15 162/82  03/10/15 162/96  12/02/14 136/84  consider change amlod to hydralazine for persistent elevation BP next visit

## 2015-03-16 NOTE — Progress Notes (Signed)
Pre visit review using our clinic review tool, if applicable. No additional management support is needed unless otherwise documented below in the visit note. 

## 2015-03-16 NOTE — Patient Instructions (Signed)
Ok to increase the amlodipine to 10 mg per day  Please continue all other medications as before, and refills have been done if requested.  Please have the pharmacy call with any other refills you may need.  Please continue your efforts at being more active, low cholesterol diet, and weight control.  Please keep your appointments with your specialists as you may have planned  Please return in 1 months, or sooner if needed

## 2015-03-16 NOTE — Progress Notes (Signed)
Subjective:    Patient ID: Marcus Boyd, male    DOB: Aug 04, 1946, 69 y.o.   MRN: [id number]  HPI  Here to f/u; overall doing ok,  Pt denies chest pain, increasing sob or doe, wheezing, orthopnea, PND, increased LE swelling, palpitations, dizziness or syncope.  Pt denies new neurological symptoms such as new headache, or facial or extremity weakness or numbness.  Pt denies polydipsia, polyuria, or low sugar episode.   Pt denies new neurological symptoms such as new headache, or facial or extremity weakness or numbness.   Pt states overall good compliance with meds, mostly trying to follow appropriate diet, with wt overall stable,  but little exercise however. BP Readings from Last 3 Encounters:  03/16/15 162/82  03/10/15 162/96  12/02/14 136/84   Past Medical History  Diagnosis Date  . ERECTILE DYSFUNCTION 04/10/2007  . HYPERTENSION 04/10/2007  . Hyperlipemia 11/14/2010  . Warfarin anticoagulation 11/16/2011  . Atrial fibrillation, new onset 11/16/2011  . Flutter-fibrillation   . Exertional dyspnea     "related to my heart problems"   Past Surgical History  Procedure Laterality Date  . Radiofrequency ablation  03/12/12  . Atrial flutter ablation N/A 03/12/2012    Procedure: ATRIAL FLUTTER ABLATION;  Surgeon: Evans Lance, MD;  Location: [facility] CATH LAB;  Service: Cardiovascular;  Laterality: N/A;    reports that he quit smoking about 26 years ago. His smoking use included Cigarettes. He has a 20 pack-year smoking history. He has never used smokeless tobacco. He reports that he drinks about 0.6 oz of alcohol per week. He reports that he uses illicit drugs (Marijuana). family history includes Cancer in his father; Hypertension in his mother and sister. No Known Allergies Current Outpatient Prescriptions on File Prior to Visit  Medication Sig Dispense Refill  . amLODipine (NORVASC) 5 MG tablet Take 1 tablet (5 mg total) by mouth daily. 90 tablet 3  . aspirin 81 MG tablet Take 81 mg by mouth  daily.    Marland Kitchen atorvastatin (LIPITOR) 10 MG tablet TAKE 1 TABLET (10 MG TOTAL) BY MOUTH DAILY. 90 tablet 2  . hydrochlorothiazide (HYDRODIURIL) 25 MG tablet Take 1 tablet (25 mg total) by mouth daily. 90 tablet 3  . lisinopril (PRINIVIL,ZESTRIL) 40 MG tablet Take 1 tablet (40 mg total) by mouth daily. 90 tablet 3  . metoprolol succinate (TOPROL-XL) 100 MG 24 hr tablet TAKE 1 TABLET BY MOUTH EVERY DAY WITH FOOD OR IMMEDIATELY AFTER 30 tablet 1  . Multiple Vitamin (MULTIVITAMIN) tablet Take 1 tablet by mouth daily.      . sildenafil (VIAGRA) 100 MG tablet Take 1 tablet (100 mg total) by mouth as needed. 10 tablet 11   No current facility-administered medications on file prior to visit.     Review of Systems  Constitutional: Negative for unusual diaphoresis or night sweats HENT: Negative for ringing in ear or discharge Eyes: Negative for double vision or worsening visual disturbance.  Respiratory: Negative for choking and stridor.   Gastrointestinal: Negative for vomiting or other signifcant bowel change Genitourinary: Negative for hematuria or change in urine volume.  Musculoskeletal: Negative for other MSK pain or swelling Skin: Negative for color change and worsening wound.  Neurological: Negative for tremors and numbness other than noted  Psychiatric/Behavioral: Negative for decreased concentration or agitation other than above       Objective:   Physical Exam BP 162/82 mmHg  Pulse 82  Temp(Src) 97.8 F (36.6 C) (Oral)  Ht 6' (1.829 m)  Wt 217  lb (98.431 kg)  BMI 29.42 kg/m2  SpO2 96% VS noted,  Constitutional: Pt appears in no significant distress HENT: Head: NCAT.  Right Ear: External ear normal.  Left Ear: External ear normal.  Eyes: . Pupils are equal, round, and reactive to light. Conjunctivae and EOM are normal Neck: Normal range of motion. Neck supple.  Cardiovascular: Normal rate and regular rhythm.   Pulmonary/Chest: Effort normal and breath sounds without rales or  wheezing.  Neurological: Pt is alert. Not confused , motor grossly intact Skin: Skin is warm. No rash, no LE edema Psychiatric: Pt behavior is normal. No agitation.         Assessment & Plan:

## 2015-03-17 ENCOUNTER — Telehealth: Payer: Self-pay | Admitting: Internal Medicine

## 2015-03-17 DIAGNOSIS — Z0279 Encounter for issue of other medical certificate: Secondary | ICD-10-CM

## 2015-03-25 DIAGNOSIS — Z0279 Encounter for issue of other medical certificate: Secondary | ICD-10-CM

## 2015-03-31 ENCOUNTER — Other Ambulatory Visit: Payer: Self-pay | Admitting: Internal Medicine

## 2015-04-20 ENCOUNTER — Ambulatory Visit (INDEPENDENT_AMBULATORY_CARE_PROVIDER_SITE_OTHER): Payer: Commercial Managed Care - PPO | Admitting: Internal Medicine

## 2015-04-20 ENCOUNTER — Encounter: Payer: Self-pay | Admitting: Internal Medicine

## 2015-04-20 VITALS — BP 130/78 | HR 88 | Temp 98.1°F | Ht 72.0 in | Wt 218.0 lb

## 2015-04-20 DIAGNOSIS — Z7689 Persons encountering health services in other specified circumstances: Secondary | ICD-10-CM

## 2015-04-20 DIAGNOSIS — E785 Hyperlipidemia, unspecified: Secondary | ICD-10-CM | POA: Diagnosis not present

## 2015-04-20 DIAGNOSIS — I10 Essential (primary) hypertension: Secondary | ICD-10-CM | POA: Diagnosis not present

## 2015-04-20 DIAGNOSIS — Z0000 Encounter for general adult medical examination without abnormal findings: Secondary | ICD-10-CM

## 2015-04-20 DIAGNOSIS — Z0189 Encounter for other specified special examinations: Secondary | ICD-10-CM | POA: Diagnosis not present

## 2015-04-20 NOTE — Assessment & Plan Note (Signed)
Improved, o/w stable overall by history and exam, recent data reviewed with pt, and pt to continue medical treatment as before,  to f/u any worsening symptoms or concerns BP Readings from Last 3 Encounters:  04/20/15 130/78  03/16/15 162/82  03/10/15 162/96

## 2015-04-20 NOTE — Progress Notes (Signed)
Pre visit review using our clinic review tool, if applicable. No additional management support is needed unless otherwise documented below in the visit note. 

## 2015-04-20 NOTE — Assessment & Plan Note (Signed)
stable overall by history and exam, recent data reviewed with pt, and pt to continue medical treatment as before,  to f/u any worsening symptoms or concerns Lab Results  Component Value Date   LDLCALC 77 12/02/2014

## 2015-04-20 NOTE — Progress Notes (Signed)
Subjective:    Patient ID: Marcus Boyd, male    DOB: [birth year], 69 y.o.   MRN: [id number]  HPI   Here to f/u; overall doing ok,  Pt denies chest pain, increasing sob or doe, wheezing, orthopnea, PND, increased LE swelling, palpitations, dizziness or syncope.  Pt denies new neurological symptoms such as new headache, or facial or extremity weakness or numbness.  Pt denies polydipsia, polyuria, or low sugar episode.   Pt denies new neurological symptoms such as new headache, or facial or extremity weakness or numbness.   Pt states overall good compliance with meds, mostly trying to follow appropriate diet, with wt overall stable,  but little exercise however. BP Readings from Last 3 Encounters:  04/20/15 130/78  03/16/15 162/82  03/10/15 162/96   Wt Readings from Last 3 Encounters:  04/20/15 218 lb (98.884 kg)  03/16/15 217 lb (98.431 kg)  03/10/15 218 lb (98.884 kg)   Past Medical History  Diagnosis Date  . ERECTILE DYSFUNCTION 04/10/2007  . HYPERTENSION 04/10/2007  . Hyperlipemia 11/14/2010  . Warfarin anticoagulation 11/16/2011  . Atrial fibrillation, new onset 11/16/2011  . Flutter-fibrillation   . Exertional dyspnea     "related to my heart problems"   Past Surgical History  Procedure Laterality Date  . Radiofrequency ablation  03/12/12  . Atrial flutter ablation N/A 03/12/2012    Procedure: ATRIAL FLUTTER ABLATION;  Surgeon: Evans Lance, MD;  Location: [facility] CATH LAB;  Service: Cardiovascular;  Laterality: N/A;    reports that he quit smoking about 26 years ago. His smoking use included Cigarettes. He has a 20 pack-year smoking history. He has never used smokeless tobacco. He reports that he drinks about 0.6 oz of alcohol per week. He reports that he uses illicit drugs (Marijuana). family history includes Cancer in his father; Hypertension in his mother and sister. No Known Allergies Current Outpatient Prescriptions on File Prior to Visit  Medication Sig Dispense Refill  .  amLODipine (NORVASC) 10 MG tablet Take 1 tablet (10 mg total) by mouth daily. 90 tablet 3  . aspirin 81 MG tablet Take 81 mg by mouth daily.    Marland Kitchen atorvastatin (LIPITOR) 10 MG tablet TAKE 1 TABLET (10 MG TOTAL) BY MOUTH DAILY. 90 tablet 2  . hydrochlorothiazide (HYDRODIURIL) 25 MG tablet Take 1 tablet (25 mg total) by mouth daily. 90 tablet 3  . hydrochlorothiazide (MICROZIDE) 12.5 MG capsule TAKE ONE CAPSULE BY MOUTH EVERY DAY 30 capsule 5  . lisinopril (PRINIVIL,ZESTRIL) 40 MG tablet Take 1 tablet (40 mg total) by mouth daily. 90 tablet 3  . metoprolol succinate (TOPROL-XL) 100 MG 24 hr tablet TAKE 1 TABLET BY MOUTH EVERY DAY WITH OR IMMEDIATELY FOOD 30 tablet 5  . Multiple Vitamin (MULTIVITAMIN) tablet Take 1 tablet by mouth daily.      . sildenafil (VIAGRA) 100 MG tablet Take 1 tablet (100 mg total) by mouth as needed. 10 tablet 11   No current facility-administered medications on file prior to visit.     Review of Systems  Constitutional: Negative for unusual diaphoresis or night sweats HENT: Negative for ringing in ear or discharge Eyes: Negative for double vision or worsening visual disturbance.  Respiratory: Negative for choking and stridor.   Gastrointestinal: Negative for vomiting or other signifcant bowel change Genitourinary: Negative for hematuria or change in urine volume.  Musculoskeletal: Negative for other MSK pain or swelling Skin: Negative for color change and worsening wound.  Neurological: Negative for tremors and numbness other than noted  Psychiatric/Behavioral: Negative for decreased concentration or agitation other than above       Objective:   Physical Exam BP 130/78 mmHg  Pulse 88  Temp(Src) 98.1 F (36.7 C) (Oral)  Ht 6' (1.829 m)  Wt 218 lb (98.884 kg)  BMI 29.56 kg/m2  SpO2 98% VS noted,  Constitutional: Pt appears in no significant distress HENT: Head: NCAT.  Right Ear: External ear normal.  Left Ear: External ear normal.  Eyes: . Pupils are  equal, round, and reactive to light. Conjunctivae and EOM are normal Neck: Normal range of motion. Neck supple.  Cardiovascular: Normal rate and regular rhythm.   Pulmonary/Chest: Effort normal and breath sounds without rales or wheezing.  Neurological: Pt is alert. Not confused , motor grossly intact Skin: Skin is warm. No rash, no LE edema Psychiatric: Pt behavior is normal. No agitation.     Assessment & Plan:

## 2015-04-20 NOTE — Patient Instructions (Signed)
Please continue all other medications as before, and refills have been done if requested.  Please have the pharmacy call with any other refills you may need.  Please continue your efforts at being more active, low cholesterol diet, and weight control..  Please keep your appointments with your specialists as you may have planned  Please return in 6 months, or sooner if needed, with Lab testing done 3-5 days before  

## 2015-05-17 ENCOUNTER — Ambulatory Visit (INDEPENDENT_AMBULATORY_CARE_PROVIDER_SITE_OTHER): Payer: Commercial Managed Care - PPO | Admitting: Internal Medicine

## 2015-05-17 ENCOUNTER — Encounter: Payer: Self-pay | Admitting: Internal Medicine

## 2015-05-17 VITALS — BP 120/72 | HR 68 | Ht 72.0 in | Wt 218.4 lb

## 2015-05-17 DIAGNOSIS — E785 Hyperlipidemia, unspecified: Secondary | ICD-10-CM | POA: Diagnosis not present

## 2015-05-17 DIAGNOSIS — I483 Typical atrial flutter: Secondary | ICD-10-CM

## 2015-05-17 DIAGNOSIS — I10 Essential (primary) hypertension: Secondary | ICD-10-CM | POA: Diagnosis not present

## 2015-05-17 NOTE — Assessment & Plan Note (Signed)
He will continue his statin therapy. Will follow.

## 2015-05-17 NOTE — Assessment & Plan Note (Signed)
He has had no recurrent symptoms since his ablation. Will follow.

## 2015-05-17 NOTE — Assessment & Plan Note (Signed)
His blood pressure is well controlled. No change in meds. Instructed not to gain weight.

## 2015-05-17 NOTE — Progress Notes (Signed)
HPI [name] returns today for followup. He is a very pleasant 69 year old man with hypertension and atrial flutter who underwent catheter ablation several years ago. He has done well in the interim. He denies chest pain, shortness of breath, syncope, or palpitations. No peripheral edema. 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  No Known Allergies   Current Outpatient Prescriptions  Medication Sig Dispense Refill  . amLODipine (NORVASC) 10 MG tablet Take 1 tablet (10 mg total) by mouth daily. 90 tablet 3  . aspirin 81 MG tablet Take 81 mg by mouth daily.    Marland Kitchen atorvastatin (LIPITOR) 10 MG tablet TAKE 1 TABLET (10 MG TOTAL) BY MOUTH DAILY. 90 tablet 2  . hydrochlorothiazide (HYDRODIURIL) 25 MG tablet Take 1 tablet (25 mg total) by mouth daily. 90 tablet 3  . lisinopril (PRINIVIL,ZESTRIL) 40 MG tablet Take 1 tablet (40 mg total) by mouth daily. 90 tablet 3  . metoprolol succinate (TOPROL-XL) 100 MG 24 hr tablet TAKE 1 TABLET BY MOUTH EVERY DAY WITH OR IMMEDIATELY FOOD 30 tablet 5  . Multiple Vitamin (MULTIVITAMIN) tablet Take 1 tablet by mouth daily.       No current facility-administered medications for this visit.     Past Medical History  Diagnosis Date  . ERECTILE DYSFUNCTION 04/10/2007  . HYPERTENSION 04/10/2007  . Hyperlipemia 11/14/2010  . Warfarin anticoagulation 11/16/2011  . Atrial fibrillation, new onset ([place]) 11/16/2011  . Flutter-fibrillation   . Exertional dyspnea     "related to my heart problems"    ROS:   All systems reviewed and negative except as noted in the HPI.   Past Surgical History  Procedure Laterality Date  . Radiofrequency ablation  03/12/12  . Atrial flutter ablation N/A 03/12/2012    Procedure: ATRIAL FLUTTER ABLATION;  Surgeon: Evans Lance, MD;  Location: [facility] CATH LAB;  Service: Cardiovascular;  Laterality: N/A;     Family History  Problem Relation Age of Onset  . Hypertension Mother   . Cancer Father    unsure what kind of cancer  . Hypertension Sister     twin     Social History   Social History  . Marital Status: Married    Spouse Name: N/A  . Number of Children: 3  . Years of Education: N/A   Occupational History  . Truck Forensic psychologist   Social History Main Topics  . Smoking status: Former Smoker -- 1.00 packs/day for 20 years    Types: Cigarettes    Quit date: 08/13/1988  . Smokeless tobacco: Never Used     Comment: 03/12/12 "quit smoking in the 1990's"  . Alcohol Use: 0.6 oz/week    1 Cans of beer per week     Comment: 03/12/12 "once in a blue moon I'll have glass of wine too"  . Drug Use: Yes    Special: Marijuana     Comment: 03/12/12 "last marijuana was oversees; 1970's"  . Sexual Activity: Yes   Other Topics Concern  . Not on file   Social History Narrative     BP 120/72 mmHg  Pulse 68  Ht 6' (1.829 m)  Wt 218 lb 6.4 oz (99.066 kg)  BMI 29.61 kg/m2  Physical Exam:  Well appearing 69 yo man, NAD HEENT: Unremarkable Neck:  6 cm JVD, no thyromegally Lymphatics:  No adenopathy Back:  No CVA tenderness Lungs:  Clear with no wheezes HEART:  Regular rate rhythm, no murmurs, no rubs, no  clicks Abd:  soft, positive bowel sounds, no organomegally, no rebound, no guarding Ext:  2 plus pulses, no edema, no cyanosis, no clubbing Skin:  No rashes no nodules Neuro:  CN II through XII intact, motor grossly intact  EKG - NSR with LVH  Assess/Plan:

## 2015-05-17 NOTE — Patient Instructions (Signed)
Medication Instructions:  Your physician recommends that you continue on your current medications as directed. Please refer to the Current Medication list given to you today.   Labwork: None ordered   Testing/Procedures: None ordered   Follow-Up: Your physician wants you to follow-up in: 24 months with Dr Knox Saliva will receive a reminder letter in the mail two months in advance. If you don't receive a letter, please call our office to schedule the follow-up appointment.   Any Other Special Instructions Will Be Listed Below (If Applicable).

## 2015-05-26 ENCOUNTER — Other Ambulatory Visit: Payer: Self-pay | Admitting: Internal Medicine

## 2015-06-06 ENCOUNTER — Telehealth: Payer: Self-pay | Admitting: *Deleted

## 2015-06-06 NOTE — Telephone Encounter (Signed)
Spoke with NP regarding DOT paperwork.  I do not have the patient has CAD or has ever had a stent.  I told her we had seen him for flutter and done an ablation on him.  She has Dr Forde Dandy 05/19/15 note  I have tried to call the patient to ask about a "stent" placement in 2006.  I have no record of that.  I got his VM  Will try again tomorrow

## 2015-06-06 NOTE — Telephone Encounter (Signed)
Received call from Claiborne Billings, NP who is calling regarding DOT paperwork that was sent to Dr. Lovena Le. She is asking if pt has history of stent placement.  If so will probably need a stress test for DOT physical.  Call back number for NP is [phone].  I told her I would forward to Dr. Tanna Furry nurse for follow up.

## 2015-07-26 ENCOUNTER — Other Ambulatory Visit: Payer: Self-pay | Admitting: Internal Medicine

## 2015-09-22 ENCOUNTER — Other Ambulatory Visit: Payer: Self-pay | Admitting: Internal Medicine

## 2015-12-09 ENCOUNTER — Encounter: Payer: Self-pay | Admitting: Internal Medicine

## 2015-12-09 ENCOUNTER — Other Ambulatory Visit (INDEPENDENT_AMBULATORY_CARE_PROVIDER_SITE_OTHER): Payer: Commercial Managed Care - PPO

## 2015-12-09 ENCOUNTER — Ambulatory Visit (INDEPENDENT_AMBULATORY_CARE_PROVIDER_SITE_OTHER): Payer: Medicare Other | Admitting: Internal Medicine

## 2015-12-09 ENCOUNTER — Other Ambulatory Visit: Payer: Self-pay | Admitting: Internal Medicine

## 2015-12-09 VITALS — BP 136/80 | HR 68 | Temp 98.3°F | Resp 20 | Wt 222.0 lb

## 2015-12-09 DIAGNOSIS — N320 Bladder-neck obstruction: Secondary | ICD-10-CM

## 2015-12-09 DIAGNOSIS — Z23 Encounter for immunization: Secondary | ICD-10-CM

## 2015-12-09 DIAGNOSIS — E785 Hyperlipidemia, unspecified: Secondary | ICD-10-CM

## 2015-12-09 DIAGNOSIS — I483 Typical atrial flutter: Secondary | ICD-10-CM | POA: Diagnosis not present

## 2015-12-09 DIAGNOSIS — Z1159 Encounter for screening for other viral diseases: Secondary | ICD-10-CM

## 2015-12-09 DIAGNOSIS — I10 Essential (primary) hypertension: Secondary | ICD-10-CM

## 2015-12-09 DIAGNOSIS — R972 Elevated prostate specific antigen [PSA]: Secondary | ICD-10-CM

## 2015-12-09 LAB — TSH: TSH: 0.68 u[IU]/mL (ref 0.35–4.50)

## 2015-12-09 LAB — LIPID PANEL
Cholesterol: 127 mg/dL (ref 0–200)
HDL: 32.8 mg/dL — AB (ref >39.00)
LDL CALC: 73 mg/dL (ref 0–99)
NONHDL: 94.49
Total CHOL/HDL Ratio: 4
Triglycerides: 108 mg/dL (ref 0.0–149.0)
VLDL: 21.6 mg/dL (ref 0.0–40.0)

## 2015-12-09 LAB — URINALYSIS, ROUTINE W REFLEX MICROSCOPIC
BILIRUBIN URINE: NEGATIVE
Hgb urine dipstick: NEGATIVE
Ketones, ur: NEGATIVE
LEUKOCYTES UA: NEGATIVE
Nitrite: NEGATIVE
PH: 6 (ref 5.0–8.0)
RBC / HPF: NONE SEEN (ref 0–?)
Specific Gravity, Urine: 1.015 (ref 1.000–1.030)
TOTAL PROTEIN, URINE-UPE24: NEGATIVE
URINE GLUCOSE: NEGATIVE
UROBILINOGEN UA: 0.2 (ref 0.0–1.0)

## 2015-12-09 LAB — CBC WITH DIFFERENTIAL/PLATELET
BASOS ABS: 0 10*3/uL (ref 0.0–0.1)
Basophils Relative: 0.2 % (ref 0.0–3.0)
EOS ABS: 0.1 10*3/uL (ref 0.0–0.7)
Eosinophils Relative: 1.2 % (ref 0.0–5.0)
HCT: 40.2 % (ref 39.0–52.0)
Hemoglobin: 13.3 g/dL (ref 13.0–17.0)
LYMPHS ABS: 2.9 10*3/uL (ref 0.7–4.0)
LYMPHS PCT: 30.5 % (ref 12.0–46.0)
MCHC: 33 g/dL (ref 30.0–36.0)
MCV: 86.7 fl (ref 78.0–100.0)
Monocytes Absolute: 0.7 10*3/uL (ref 0.1–1.0)
Monocytes Relative: 7.8 % (ref 3.0–12.0)
NEUTROS ABS: 5.7 10*3/uL (ref 1.4–7.7)
NEUTROS PCT: 60.3 % (ref 43.0–77.0)
PLATELETS: 301 10*3/uL (ref 150.0–400.0)
RBC: 4.64 Mil/uL (ref 4.22–5.81)
RDW: 14.3 % (ref 11.5–15.5)
WBC: 9.4 10*3/uL (ref 4.0–10.5)

## 2015-12-09 LAB — BASIC METABOLIC PANEL
BUN: 15 mg/dL (ref 6–23)
CALCIUM: 9.2 mg/dL (ref 8.4–10.5)
CO2: 33 meq/L — AB (ref 19–32)
CREATININE: 1.07 mg/dL (ref 0.40–1.50)
Chloride: 99 mEq/L (ref 96–112)
GFR: 88.27 mL/min (ref >60.00)
GLUCOSE: 88 mg/dL (ref 70–99)
Potassium: 3 mEq/L — ABNORMAL LOW (ref 3.5–5.1)
SODIUM: 139 meq/L (ref 135–145)

## 2015-12-09 LAB — HEPATIC FUNCTION PANEL
ALBUMIN: 4.1 g/dL (ref 3.5–5.2)
ALK PHOS: 52 U/L (ref 39–117)
ALT: 21 U/L (ref 0–53)
AST: 22 U/L (ref 0–37)
BILIRUBIN DIRECT: 0.1 mg/dL (ref 0.0–0.3)
TOTAL PROTEIN: 7.4 g/dL (ref 6.0–8.3)
Total Bilirubin: 0.3 mg/dL (ref 0.2–1.2)

## 2015-12-09 LAB — PSA: PSA: 5.17 ng/mL — ABNORMAL HIGH (ref 0.10–4.00)

## 2015-12-09 MED ORDER — POTASSIUM CHLORIDE ER 10 MEQ PO TBCR: 10.0000 meq | EXTENDED_RELEASE_TABLET | Freq: Every day | ORAL | Status: DC

## 2015-12-09 NOTE — Progress Notes (Signed)
Subjective:    Patient ID: Marcus Boyd, male    DOB: 09-Feb-1946, 70 y.o.   MRN: [id number]  HPI  Here for yearly f/u;  Overall doing ok;  Pt denies Chest pain, worsening SOB, DOE, wheezing, orthopnea, PND, worsening LE edema, palpitations, dizziness or syncope.  Pt denies neurological change such as new headache, facial or extremity weakness.  Pt denies polydipsia, polyuria, or low sugar symptoms. Pt states overall good compliance with treatment and medications, good tolerability, and has been trying to follow appropriate diet.  Pt denies worsening depressive symptoms, suicidal ideation or panic. No fever, night sweats, wt loss, loss of appetite, or other constitutional symptoms.  Pt states good ability with ADL's, has low fall risk, home safety reviewed and adequate, no other significant changes in hearing or vision, and only occasionally active with exercise.  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  Denies urinary symptoms such as dysuria, frequency, urgency, flank pain, hematuria or n/v, fever, chills.  Denies worsening reflux, abd pain, dysphagia, n/v, bowel change or blood.   Past Medical History  Diagnosis Date  . ERECTILE DYSFUNCTION 04/10/2007  . HYPERTENSION 04/10/2007  . Hyperlipemia 11/14/2010  . Warfarin anticoagulation 11/16/2011  . Atrial fibrillation, new onset ([place]) 11/16/2011  . Flutter-fibrillation   . Exertional dyspnea     "related to my heart problems"   Past Surgical History  Procedure Laterality Date  . Radiofrequency ablation  03/12/12  . Atrial flutter ablation N/A 03/12/2012    Procedure: ATRIAL FLUTTER ABLATION;  Surgeon: Evans Lance, MD;  Location: [facility] CATH LAB;  Service: Cardiovascular;  Laterality: N/A;    reports that he quit smoking about 27 years ago. His smoking use included Cigarettes. He has a 20 pack-year smoking history. He has never used smokeless tobacco. He reports that he drinks about 0.6 oz of alcohol  per week. He reports that he uses illicit drugs (Marijuana). family history includes Cancer in his father; Hypertension in his mother and sister. No Known Allergies Current Outpatient Prescriptions on File Prior to Visit  Medication Sig Dispense Refill  . amLODipine (NORVASC) 10 MG tablet Take 1 tablet (10 mg total) by mouth daily. 90 tablet 3  . aspirin 81 MG tablet Take 81 mg by mouth daily.    Marland Kitchen atorvastatin (LIPITOR) 10 MG tablet TAKE 1 TABLET (10 MG TOTAL) BY MOUTH DAILY. 90 tablet 2  . hydrochlorothiazide (HYDRODIURIL) 25 MG tablet Take 1 tablet (25 mg total) by mouth daily. 90 tablet 3  . hydrochlorothiazide (MICROZIDE) 12.5 MG capsule TAKE ONE CAPSULE BY MOUTH EVERY DAY 30 capsule 5  . lisinopril (PRINIVIL,ZESTRIL) 40 MG tablet TAKE 1 TABLET (40 MG TOTAL) BY MOUTH DAILY. 90 tablet 3  . metoprolol succinate (TOPROL-XL) 100 MG 24 hr tablet TAKE 1 TABLET BY MOUTH EVERY DAY WITH OR IMMEDIATELY FOOD 30 tablet 5  . Multiple Vitamin (MULTIVITAMIN) tablet Take 1 tablet by mouth daily.       No current facility-administered medications on file prior to visit.   Review of Systems Constitutional: Negative for increased diaphoresis, or other activity, appetite or siginficant weight change other than noted HENT: Negative for worsening hearing loss, ear pain, facial swelling, mouth sores and neck stiffness.   Eyes: Negative for other worsening pain, redness or visual disturbance.  Respiratory: Negative for choking or stridor Cardiovascular: Negative for other chest pain and palpitations.  Gastrointestinal: Negative for worsening diarrhea, blood in stool, or abdominal distention Genitourinary:  Negative for hematuria, flank pain or change in urine volume.  Musculoskeletal: Negative for myalgias or other joint complaints.  Skin: Negative for other color change and wound or drainage.  Neurological: Negative for syncope and numbness. other than noted Hematological: Negative for adenopathy. or other  swelling Psychiatric/Behavioral: Negative for hallucinations, SI, self-injury, decreased concentration or other worsening agitation.      Objective:   Physical Exam BP 136/80 mmHg  Pulse 68  Temp(Src) 98.3 F (36.8 C) (Oral)  Resp 20  Wt 222 lb (100.699 kg)  SpO2 95% VS noted,  Constitutional: Pt is oriented to person, place, and time. Appears well-developed and well-nourished, in no significant distress Head: Normocephalic and atraumatic  Eyes: Conjunctivae and EOM are normal. Pupils are equal, round, and reactive to light Right Ear: External ear normal.  Left Ear: External ear normal Nose: Nose normal.  Mouth/Throat: Oropharynx is clear and moist  Neck: Normal range of motion. Neck supple. No JVD present. No tracheal deviation present or significant neck LA or mass Cardiovascular: Normal rate, regular rhythm, normal heart sounds and intact distal pulses.   Pulmonary/Chest: Effort normal and breath sounds without rales or wheezing  Abdominal: Soft. Bowel sounds are normal. NT. No HSM  Musculoskeletal: Normal range of motion. Exhibits no edema Lymphadenopathy: Has no cervical adenopathy.  Neurological: Pt is alert and oriented to person, place, and time. Pt has normal reflexes. No cranial nerve deficit. Motor grossly intact Skin: Skin is warm and dry. No rash noted or new ulcers Psychiatric:  Has normal mood and affect. Behavior is normal.     Assessment & Plan:

## 2015-12-09 NOTE — Assessment & Plan Note (Signed)
stable overall by history and exam, recent data reviewed with pt, and pt to continue medical treatment as before,  to f/u any worsening symptoms or concerns Lab Results  Component Value Date   WBC 7.7 12/02/2014   HGB 14.9 12/02/2014   HCT 44.4 12/02/2014   PLT 304.0 12/02/2014   GLUCOSE 94 12/02/2014   CHOL 135 12/02/2014   TRIG 105.0 12/02/2014   HDL 36.90* 12/02/2014   LDLDIRECT 98.6 11/30/2011   LDLCALC 77 12/02/2014   ALT 18 12/02/2014   AST 23 12/02/2014   NA 144 12/02/2014   K 3.9 12/02/2014   CL 101 12/02/2014   CREATININE 1.15 12/02/2014   BUN 12 12/02/2014   CO2 33* 12/02/2014   TSH 0.61 12/02/2014   PSA 2.51 12/02/2014   INR 1.6* 04/04/2012

## 2015-12-09 NOTE — Addendum Note (Signed)
Addended by: Levonne Lapping on: 12/09/2015 01:43 PM   Modules accepted: Orders

## 2015-12-09 NOTE — Assessment & Plan Note (Signed)
stable overall by history and exam, recent data reviewed with pt, and pt to continue medical treatment as before,  to f/u any worsening symptoms or concerns Lab Results  Component Value Date   LDLCALC 77 12/02/2014    

## 2015-12-09 NOTE — Progress Notes (Signed)
Pre visit review using our clinic review tool, if applicable. No additional management support is needed unless otherwise documented below in the visit note. 

## 2015-12-09 NOTE — Assessment & Plan Note (Signed)
stable overall by history and exam, recent data reviewed with pt, and pt to continue medical treatment as before,  to f/u any worsening symptoms or concerns BP Readings from Last 3 Encounters:  12/09/15 136/80  05/17/15 120/72  04/20/15 130/78

## 2015-12-09 NOTE — Patient Instructions (Signed)
You had the tetanus (Tdap) shot today  Please continue all other medications as before, and refills have been done if requested.  Please have the pharmacy call with any other refills you may need.  Please continue your efforts at being more active, low cholesterol diet, and weight control.  You are otherwise up to date with prevention measures today.  Please keep your appointments with your specialists as you may have planned  Please go to the LAB in the Basement (turn left off the elevator) for the tests to be done today  You will be contacted by phone if any changes need to be made immediately.  Otherwise, you will receive a letter about your results with an explanation, but please check with MyChart first.  Please remember to sign up for MyChart if you have not done so, as this will be important to you in the future with finding out test results, communicating by private email, and scheduling acute appointments online when needed.  Please return in 1 year for your yearly visit, or sooner if needed

## 2016-02-09 ENCOUNTER — Other Ambulatory Visit: Payer: Self-pay | Admitting: Internal Medicine

## 2016-04-04 ENCOUNTER — Other Ambulatory Visit: Payer: Self-pay | Admitting: Internal Medicine

## 2016-04-18 ENCOUNTER — Other Ambulatory Visit: Payer: Self-pay | Admitting: Internal Medicine

## 2016-05-22 ENCOUNTER — Other Ambulatory Visit: Payer: Self-pay | Admitting: Internal Medicine

## 2016-06-09 ENCOUNTER — Emergency Department (HOSPITAL_COMMUNITY)
Admission: EM | Admit: 2016-06-09 | Discharge: 2016-06-09 | Disposition: A | Discharge status: Home / Self Care | Payer: Commercial Managed Care - PPO | Attending: Emergency Medicine | Admitting: Emergency Medicine

## 2016-06-09 ENCOUNTER — Encounter (HOSPITAL_COMMUNITY): Payer: Self-pay | Admitting: *Deleted

## 2016-06-09 DIAGNOSIS — Z87891 Personal history of nicotine dependence: Secondary | ICD-10-CM | POA: Diagnosis not present

## 2016-06-09 DIAGNOSIS — R369 Urethral discharge, unspecified: Secondary | ICD-10-CM | POA: Diagnosis not present

## 2016-06-09 DIAGNOSIS — Z7982 Long term (current) use of aspirin: Secondary | ICD-10-CM | POA: Insufficient documentation

## 2016-06-09 DIAGNOSIS — Z79899 Other long term (current) drug therapy: Secondary | ICD-10-CM | POA: Insufficient documentation

## 2016-06-09 DIAGNOSIS — I10 Essential (primary) hypertension: Secondary | ICD-10-CM | POA: Insufficient documentation

## 2016-06-09 MED ORDER — LIDOCAINE HCL 1 % IJ SOLN
INTRAMUSCULAR | Status: AC
  Administered 2016-06-09: 1.5 mL
  Filled 2016-06-09: qty 20

## 2016-06-09 MED ORDER — CEFTRIAXONE SODIUM 250 MG IJ SOLR
250.0000 mg | Freq: Once | INTRAMUSCULAR | Status: AC
  Administered 2016-06-09: 250 mg via INTRAMUSCULAR
  Filled 2016-06-09: qty 250

## 2016-06-09 MED ORDER — AZITHROMYCIN 250 MG PO TABS
1000.0000 mg | ORAL_TABLET | Freq: Once | ORAL | Status: AC
  Administered 2016-06-09: 1000 mg via ORAL
  Filled 2016-06-09: qty 4

## 2016-06-09 NOTE — ED Triage Notes (Signed)
Pt complains of white discharge from his penis for the past 2 days. Pt denies pain while urinating. Pt states last unprotected sexual intercourse was 2 weeks. Pt does not know if his partner had any sexually transmitted diseases.

## 2016-06-09 NOTE — ED Provider Notes (Signed)
[facility] DEPT Provider Note   CSN: [id number] Arrival date & time: 06/09/16  1232  By signing my name below, I, Marcus Boyd, attest that this documentation has been prepared under the direction and in the presence of Marcus Circle, PA-C. Electronically Signed: Maren Boyd, Scribe. 06/09/16. 1:06 PM.   History   Chief Complaint Chief Complaint  Patient presents with  . Penile Discharge    HPI   HPI Comments: Marcus Boyd is a 70 y.o. male who presents to the Emergency Department complaining of white urinary discharge for the past 2 days. Has discharge every day. Denies fever, chills, dysuria, swelling of testicles, pain of testicles, or testicular lesions/sores. Last unprotected sexual intercourse was 2 weeks ago and is not sure if his partner had any STDs.   Past Medical History:  Diagnosis Date  . Atrial fibrillation, new onset ([place]) 11/16/2011  . ERECTILE DYSFUNCTION 04/10/2007  . Exertional dyspnea    "related to my heart problems"  . Flutter-fibrillation   . Hyperlipemia 11/14/2010  . HYPERTENSION 04/10/2007  . Warfarin anticoagulation 11/16/2011    Patient Active Problem List   Diagnosis Date Noted  . Warfarin anticoagulation 11/16/2011  . Typical atrial flutter ([place]) 11/16/2011  . Hyperlipidemia 11/14/2010  . Preventative health care 11/14/2010  . ERECTILE DYSFUNCTION 04/10/2007  . Essential hypertension 04/10/2007    Past Surgical History:  Procedure Laterality Date  . ATRIAL FLUTTER ABLATION N/A 03/12/2012   Procedure: ATRIAL FLUTTER ABLATION;  Surgeon: Evans Lance, MD;  Location: [facility] CATH LAB;  Service: Cardiovascular;  Laterality: N/A;  . RADIOFREQUENCY ABLATION  03/12/12       Home Medications    Prior to Admission medications   Medication Sig Start Date End Date Taking? Authorizing Provider  amLODipine (NORVASC) 10 MG tablet Take 1 tablet (10 mg total) by mouth daily. 03/16/15   Biagio Borg, MD  amLODipine (NORVASC) 5 MG tablet TAKE 1 TABLET (5  MG TOTAL) BY MOUTH DAILY. 02/09/16   Biagio Borg, MD  aspirin 81 MG tablet Take 81 mg by mouth daily.    Historical Provider, MD  atorvastatin (LIPITOR) 10 MG tablet TAKE 1 TABLET (10 MG TOTAL) BY MOUTH DAILY. 04/04/16   Biagio Borg, MD  hydrochlorothiazide (HYDRODIURIL) 25 MG tablet TAKE 1 TABLET (25 MG TOTAL) BY MOUTH DAILY. 02/09/16   Biagio Borg, MD  hydrochlorothiazide (MICROZIDE) 12.5 MG capsule TAKE ONE CAPSULE BY MOUTH EVERY DAY 09/22/15   Biagio Borg, MD  lisinopril (PRINIVIL,ZESTRIL) 40 MG tablet TAKE 1 TABLET (40 MG TOTAL) BY MOUTH DAILY. 05/22/16   Biagio Borg, MD  metoprolol succinate (TOPROL-XL) 100 MG 24 hr tablet TAKE 1 TABLET BY MOUTH EVERY DAY WITH OR IMMEDIATELY FOOD 04/18/16   Biagio Borg, MD  Multiple Vitamin (MULTIVITAMIN) tablet Take 1 tablet by mouth daily.      Historical Provider, MD  potassium chloride (K-DUR) 10 MEQ tablet Take 1 tablet (10 mEq total) by mouth daily. 12/09/15   Biagio Borg, MD    Family History Family History  Problem Relation Age of Onset  . Hypertension Mother   . Cancer Father     unsure what kind of cancer  . Hypertension Sister     twin    Social History Social History  Substance Use Topics  . Smoking status: Former Smoker    Packs/day: 1.00    Years: 20.00    Types: Cigarettes    Quit date: 08/13/1988  . Smokeless tobacco: Never Used  Comment: 03/12/12 "quit smoking in the 1990's"  . Alcohol use 0.6 oz/week    1 Cans of beer per week     Comment: 03/12/12 "once in a blue moon I'll have glass of wine too"     Allergies   Review of patient's allergies indicates no known allergies.   Review of Systems Review of Systems  Constitutional: Negative for chills and fever.  Genitourinary: Positive for discharge (white x 2 days). Negative for dysuria, genital sores and testicular pain.     Physical Exam Updated Vital Signs BP 139/98   Pulse 71   Temp 97.8 F (36.6 C) (Oral)   Resp 18   Wt 186 lb (84.4 kg)   SpO2 98%   BMI  25.23 kg/m   Physical Exam  Constitutional: He is oriented to person, place, and time. He appears well-developed and well-nourished.  HENT:  Head: Normocephalic and atraumatic.  Cardiovascular: Normal rate.   Pulmonary/Chest: Effort normal.  Genitourinary:  Genitourinary Comments: Chaperone present for exam; mild clear penile discharge, no masses, lesions, tenderness, or other abnormality about the penis, scrotum, or testicles  Neurological: He is alert and oriented to person, place, and time.  Skin: Skin is warm and dry.  Psychiatric: He has a normal mood and affect.  Nursing note and vitals reviewed.    ED Treatments / Results  DIAGNOSTIC STUDIES: Oxygen Saturation is 98% on RA, normal by my interpretation.    COORDINATION OF CARE: 1:03 PM Discussed treatment plan with pt at bedside and pt agreed to plan.  Labs (all labs ordered are listed, but only abnormal results are displayed) Labs Reviewed - No data to display  EKG  EKG Interpretation None       Radiology No results found.  Procedures Procedures (including critical care time)  Medications Ordered in ED Medications - No data to display   Initial Impression / Assessment and Plan / ED Course  I have reviewed the triage vital signs and the nursing notes.  Pertinent labs & imaging results that were available during my care of the patient were reviewed by me and considered in my medical decision making (see chart for details).  Clinical Course    Patient with new penile discharge for the past 2 days. No dysuria. No fever, vomiting, or testicle pain. New sexual contact 2 weeks ago. We'll check gonorrhea/chlamydia swab, and treat with azithromycin and Rocephin prophylactically. Patient educated regarding no intercourse during treatment.  Final Clinical Impressions(s) / ED Diagnoses   Final diagnoses:  Penile discharge   I personally performed the services described in this documentation, which was scribed  in my presence. The recorded information has been reviewed and is accurate.    New Prescriptions New Prescriptions   No medications on file     Marcus Circle, PA-C 06/09/16 [place], MD 06/10/16 1112

## 2016-06-11 LAB — GC/CHLAMYDIA PROBE AMP (~~LOC~~) NOT AT ARMC
Chlamydia: NEGATIVE
Neisseria Gonorrhea: POSITIVE — AB

## 2016-06-12 ENCOUNTER — Telehealth (HOSPITAL_BASED_OUTPATIENT_CLINIC_OR_DEPARTMENT_OTHER): Payer: Self-pay | Admitting: Emergency Medicine

## 2016-09-10 ENCOUNTER — Other Ambulatory Visit: Payer: Self-pay | Admitting: Internal Medicine

## 2016-10-07 ENCOUNTER — Other Ambulatory Visit: Payer: Self-pay | Admitting: Internal Medicine

## 2016-11-05 ENCOUNTER — Other Ambulatory Visit: Payer: Self-pay | Admitting: Internal Medicine

## 2016-12-06 ENCOUNTER — Other Ambulatory Visit: Payer: Self-pay | Admitting: Internal Medicine

## 2016-12-14 ENCOUNTER — Ambulatory Visit (INDEPENDENT_AMBULATORY_CARE_PROVIDER_SITE_OTHER): Payer: Medicare Other | Admitting: Internal Medicine

## 2016-12-14 ENCOUNTER — Encounter: Payer: Self-pay | Admitting: Internal Medicine

## 2016-12-14 ENCOUNTER — Other Ambulatory Visit (INDEPENDENT_AMBULATORY_CARE_PROVIDER_SITE_OTHER): Payer: Medicare Other

## 2016-12-14 VITALS — BP 142/80 | HR 86 | Ht 72.0 in | Wt 223.0 lb

## 2016-12-14 DIAGNOSIS — R7989 Other specified abnormal findings of blood chemistry: Secondary | ICD-10-CM | POA: Diagnosis not present

## 2016-12-14 DIAGNOSIS — Z23 Encounter for immunization: Secondary | ICD-10-CM | POA: Diagnosis not present

## 2016-12-14 DIAGNOSIS — Z1159 Encounter for screening for other viral diseases: Secondary | ICD-10-CM

## 2016-12-14 DIAGNOSIS — R972 Elevated prostate specific antigen [PSA]: Secondary | ICD-10-CM | POA: Diagnosis not present

## 2016-12-14 DIAGNOSIS — E785 Hyperlipidemia, unspecified: Secondary | ICD-10-CM

## 2016-12-14 DIAGNOSIS — I10 Essential (primary) hypertension: Secondary | ICD-10-CM

## 2016-12-14 LAB — LDL CHOLESTEROL, DIRECT: LDL DIRECT: 72 mg/dL

## 2016-12-14 LAB — HEPATIC FUNCTION PANEL
ALK PHOS: 55 U/L (ref 39–117)
ALT: 19 U/L (ref 0–53)
AST: 20 U/L (ref 0–37)
Albumin: 4.2 g/dL (ref 3.5–5.2)
BILIRUBIN DIRECT: 0.1 mg/dL (ref 0.0–0.3)
BILIRUBIN TOTAL: 0.4 mg/dL (ref 0.2–1.2)
TOTAL PROTEIN: 7.4 g/dL (ref 6.0–8.3)

## 2016-12-14 LAB — BASIC METABOLIC PANEL
BUN: 14 mg/dL (ref 6–23)
CALCIUM: 9.6 mg/dL (ref 8.4–10.5)
CHLORIDE: 101 meq/L (ref 96–112)
CO2: 32 mEq/L (ref 19–32)
Creatinine, Ser: 1.16 mg/dL (ref 0.40–1.50)
GFR: 80.17 mL/min (ref >60.00)
Glucose, Bld: 106 mg/dL — ABNORMAL HIGH (ref 70–99)
Potassium: 3.5 mEq/L (ref 3.5–5.1)
SODIUM: 142 meq/L (ref 135–145)

## 2016-12-14 LAB — LIPID PANEL
CHOL/HDL RATIO: 5
Cholesterol: 138 mg/dL (ref 0–200)
HDL: 30.7 mg/dL — ABNORMAL LOW (ref >39.00)
NONHDL: 107.55
TRIGLYCERIDES: 202 mg/dL — AB (ref 0.0–149.0)
VLDL: 40.4 mg/dL — AB (ref 0.0–40.0)

## 2016-12-14 LAB — CBC WITH DIFFERENTIAL/PLATELET
BASOS ABS: 0 10*3/uL (ref 0.0–0.1)
BASOS PCT: 0.4 % (ref 0.0–3.0)
Eosinophils Absolute: 0.1 10*3/uL (ref 0.0–0.7)
Eosinophils Relative: 0.9 % (ref 0.0–5.0)
HEMATOCRIT: 42 % (ref 39.0–52.0)
Hemoglobin: 14.4 g/dL (ref 13.0–17.0)
LYMPHS ABS: 2.5 10*3/uL (ref 0.7–4.0)
Lymphocytes Relative: 35.5 % (ref 12.0–46.0)
MCHC: 34.2 g/dL (ref 30.0–36.0)
MCV: 87.1 fl (ref 78.0–100.0)
Monocytes Absolute: 0.7 10*3/uL (ref 0.1–1.0)
Monocytes Relative: 9.8 % (ref 3.0–12.0)
NEUTROS ABS: 3.7 10*3/uL (ref 1.4–7.7)
NEUTROS PCT: 53.4 % (ref 43.0–77.0)
PLATELETS: 296 10*3/uL (ref 150.0–400.0)
RBC: 4.82 Mil/uL (ref 4.22–5.81)
RDW: 14 % (ref 11.5–15.5)
WBC: 7 10*3/uL (ref 4.0–10.5)

## 2016-12-14 LAB — URINALYSIS, ROUTINE W REFLEX MICROSCOPIC
BILIRUBIN URINE: NEGATIVE
Hgb urine dipstick: NEGATIVE
Ketones, ur: NEGATIVE
Leukocytes, UA: NEGATIVE
Nitrite: NEGATIVE
PH: 7.5 (ref 5.0–8.0)
SPECIFIC GRAVITY, URINE: 1.02 (ref 1.000–1.030)
TOTAL PROTEIN, URINE-UPE24: NEGATIVE
UROBILINOGEN UA: 0.2 (ref 0.0–1.0)
Urine Glucose: NEGATIVE

## 2016-12-14 LAB — PSA: PSA: 3.11 ng/mL (ref 0.10–4.00)

## 2016-12-14 LAB — TSH: TSH: 0.81 u[IU]/mL (ref 0.35–4.50)

## 2016-12-14 MED ORDER — ATORVASTATIN CALCIUM 10 MG PO TABS: 10.0000 mg | ORAL_TABLET | Freq: Every day | ORAL | 3 refills | Status: DC

## 2016-12-14 MED ORDER — LISINOPRIL 40 MG PO TABS: ORAL_TABLET | ORAL | 3 refills | Status: DC

## 2016-12-14 MED ORDER — METOPROLOL SUCCINATE ER 100 MG PO TB24: ORAL_TABLET | ORAL | 3 refills | Status: DC

## 2016-12-14 MED ORDER — AMLODIPINE BESYLATE 10 MG PO TABS: 10.0000 mg | ORAL_TABLET | Freq: Every day | ORAL | 3 refills | Status: DC

## 2016-12-14 MED ORDER — POTASSIUM CHLORIDE ER 10 MEQ PO TBCR
10.0000 meq | EXTENDED_RELEASE_TABLET | Freq: Every day | ORAL | 3 refills | Status: DC
Start: 2016-12-14 — End: 2018-03-19

## 2016-12-14 MED ORDER — HYDROCHLOROTHIAZIDE 25 MG PO TABS: ORAL_TABLET | ORAL | 3 refills | Status: DC

## 2016-12-14 NOTE — Patient Instructions (Addendum)
You had the Pneumovax shot today  Please continue all other medications as before, and refills have been done if requested.  Please have the pharmacy call with any other refills you may need.  Please continue your efforts at being more active, low cholesterol diet, and weight control.  You are otherwise up to date with prevention measures today.  Please keep your appointments with your specialists as you may have planned  Please go to the LAB in the Basement (turn left off the elevator) for the tests to be done today  You will be contacted by phone if any changes need to be made immediately.  Otherwise, you will receive a letter about your results with an explanation, but please check with MyChart first.  Please remember to sign up for MyChart if you have not done so, as this will be important to you in the future with finding out test results, communicating by private email, and scheduling acute appointments online when needed.  Please return in 1 year for your yearly visit, or sooner if needed

## 2016-12-14 NOTE — Assessment & Plan Note (Signed)
stable overall by history and exam, recent data reviewed with pt, and pt to continue medical treatment as before,  to f/u any worsening symptoms or concerns Lab Results  Component Value Date   LDLCALC 73 12/09/2015   For f/u lab, cont diet

## 2016-12-14 NOTE — Progress Notes (Signed)
Pre visit review using our clinic review tool, if applicable. No additional management support is needed unless otherwise documented below in the visit note. 

## 2016-12-14 NOTE — Progress Notes (Signed)
Subjective:    Patient ID: Marcus Boyd, male    DOB: [birth year], 71 y.o.   MRN: [id number]  HPI  Here for yearly f/u;  Overall doing ok;  Pt denies Chest pain, worsening SOB, DOE, wheezing, orthopnea, PND, worsening LE edema, palpitations, dizziness or syncope.  Pt denies neurological change such as new headache, facial or extremity weakness.  Pt denies polydipsia, polyuria, or low sugar symptoms. Pt states overall good compliance with treatment and medications, good tolerability, and has been trying to follow appropriate diet.  Pt denies worsening depressive symptoms, suicidal ideation or panic. No fever, night sweats, wt loss, loss of appetite, or other constitutional symptoms.  Pt states good ability with ADL's, has low fall risk, home safety reviewed and adequate, no other significant changes in hearing or vision, and daily active with exercise for 30 minutes. Unfortunately did not see urology after last psa mild elevated Denies urinary symptoms such as dysuria, frequency, urgency, flank pain, hematuria or n/v, fever, chills.   Past Medical History:  Diagnosis Date  . Atrial fibrillation, new onset ([place]) 11/16/2011  . ERECTILE DYSFUNCTION 04/10/2007  . Exertional dyspnea    "related to my heart problems"  . Flutter-fibrillation   . Hyperlipemia 11/14/2010  . HYPERTENSION 04/10/2007  . Warfarin anticoagulation 11/16/2011   Past Surgical History:  Procedure Laterality Date  . ATRIAL FLUTTER ABLATION N/A 03/12/2012   Procedure: ATRIAL FLUTTER ABLATION;  Surgeon: Evans Lance, MD;  Location: [facility] CATH LAB;  Service: Cardiovascular;  Laterality: N/A;  . RADIOFREQUENCY ABLATION  03/12/12    reports that he quit smoking about 28 years ago. His smoking use included Cigarettes. He has a 20.00 pack-year smoking history. He has never used smokeless tobacco. He reports that he drinks about 0.6 oz of alcohol per week . He reports that he uses drugs, including Marijuana. family history includes Cancer in  his father; Hypertension in his mother and sister. No Known Allergies Current Outpatient Prescriptions on File Prior to Visit  Medication Sig Dispense Refill  . aspirin 81 MG tablet Take 81 mg by mouth daily.    . Multiple Vitamin (MULTIVITAMIN) tablet Take 1 tablet by mouth daily.       No current facility-administered medications on file prior to visit.    Review of Systems Constitutional: Negative for other unusual diaphoresis, sweats, appetite or weight changes HENT: Negative for other worsening hearing loss, ear pain, facial swelling, mouth sores or neck stiffness.   Eyes: Negative for other worsening pain, redness or other visual disturbance.  Respiratory: Negative for other stridor or swelling Cardiovascular: Negative for other palpitations or other chest pain  Gastrointestinal: Negative for worsening diarrhea or loose stools, blood in stool, distention or other pain Genitourinary: Negative for hematuria, flank pain or other change in urine volume.  Musculoskeletal: Negative for myalgias or other joint swelling.  Skin: Negative for other color change, or other wound or worsening drainage.  Neurological: Negative for other syncope or numbness. Hematological: Negative for other adenopathy or swelling Psychiatric/Behavioral: Negative for hallucinations, other worsening agitation, SI, self-injury, or new decreased concentration All other system neg per pt    Objective:   Physical Exam BP (!) 142/80   Pulse 86   Ht 6' (1.829 m)   Wt 223 lb (101.2 kg)   SpO2 97%   BMI 30.24 kg/m  VS noted,  Constitutional: Pt is oriented to person, place, and time. Appears well-developed and well-nourished, in no significant distress and comfortable Head: Normocephalic and atraumatic  Eyes: Conjunctivae and EOM are normal. Pupils are equal, round, and reactive to light Right Ear: External ear normal without discharge Left Ear: External ear normal without discharge Nose: Nose without discharge  or deformity Mouth/Throat: Oropharynx is without other ulcerations and moist  Neck: Normal range of motion. Neck supple. No JVD present. No tracheal deviation present or significant neck LA or mass Cardiovascular: Normal rate, regular rhythm, normal heart sounds and intact distal pulses.   Pulmonary/Chest: WOB normal and breath sounds without rales or wheezing  Abdominal: Soft. Bowel sounds are normal. NT. No HSM  Musculoskeletal: Normal range of motion. Exhibits no edema Lymphadenopathy: Has no other cervical adenopathy.  Neurological: Pt is alert and oriented to person, place, and time. Pt has normal reflexes. No cranial nerve deficit. Motor grossly intact, Gait intact Skin: Skin is warm and dry. No rash noted or new ulcerations Psychiatric:  Has normal mood and affect. Behavior is normal without agitation No other exam findings    Assessment & Plan:

## 2016-12-14 NOTE — Assessment & Plan Note (Signed)
asympt but concerning, pt prefers psa f/u today, then urology if still elevated

## 2016-12-14 NOTE — Assessment & Plan Note (Signed)
stable overall by history and exam, recent data reviewed with pt, and pt to continue medical treatment as before,  to f/u any worsening symptoms or concerns BP Readings from Last 3 Encounters:  12/14/16 (!) 142/80  06/09/16 139/98  12/09/15 136/80

## 2016-12-15 ENCOUNTER — Encounter: Payer: Self-pay | Admitting: Internal Medicine

## 2016-12-15 LAB — HEPATITIS C ANTIBODY: HCV Ab: NEGATIVE

## 2017-01-04 ENCOUNTER — Other Ambulatory Visit: Payer: Self-pay | Admitting: Internal Medicine

## 2017-09-08 ENCOUNTER — Emergency Department (HOSPITAL_COMMUNITY)
Admission: EM | Admit: 2017-09-08 | Discharge: 2017-09-08 | Disposition: A | Discharge status: Home / Self Care | Payer: Medicare Other | Attending: Emergency Medicine | Admitting: Emergency Medicine

## 2017-09-08 ENCOUNTER — Encounter (HOSPITAL_COMMUNITY): Payer: Self-pay

## 2017-09-08 ENCOUNTER — Other Ambulatory Visit: Payer: Self-pay

## 2017-09-08 DIAGNOSIS — R369 Urethral discharge, unspecified: Secondary | ICD-10-CM | POA: Insufficient documentation

## 2017-09-08 DIAGNOSIS — I10 Essential (primary) hypertension: Secondary | ICD-10-CM | POA: Diagnosis not present

## 2017-09-08 DIAGNOSIS — Z87891 Personal history of nicotine dependence: Secondary | ICD-10-CM | POA: Insufficient documentation

## 2017-09-08 DIAGNOSIS — Z202 Contact with and (suspected) exposure to infections with a predominantly sexual mode of transmission: Secondary | ICD-10-CM | POA: Insufficient documentation

## 2017-09-08 DIAGNOSIS — Z79899 Other long term (current) drug therapy: Secondary | ICD-10-CM | POA: Insufficient documentation

## 2017-09-08 LAB — URINALYSIS, ROUTINE W REFLEX MICROSCOPIC
Bilirubin Urine: NEGATIVE
Glucose, UA: NEGATIVE mg/dL
HGB URINE DIPSTICK: NEGATIVE
Ketones, ur: NEGATIVE mg/dL
Leukocytes, UA: NEGATIVE
NITRITE: NEGATIVE
PROTEIN: NEGATIVE mg/dL
Specific Gravity, Urine: 1.019 (ref 1.005–1.030)
pH: 7 (ref 5.0–8.0)

## 2017-09-08 LAB — WET PREP, GENITAL
Clue Cells Wet Prep HPF POC: NONE SEEN
Sperm: NONE SEEN
Trich, Wet Prep: NONE SEEN
YEAST WET PREP: NONE SEEN

## 2017-09-08 NOTE — ED Triage Notes (Signed)
He states he recently had unprotected sex. He states today he noted a small amt. Of clear penile d/c. He denies any other sign of illness and he denies any dysuria.

## 2017-09-08 NOTE — Discharge Instructions (Signed)
As discussed, make sure to follow-up with your primary care provider.  Cultures were sent out and will result in a few days and you will receive a call if anything is positive.  If this is the case he will need to notify any partners and have them treated as well.  Avoid sexual intercourse until asymptomatic and 7 days after any treatments.  Return if symptoms worsen or new concerning symptoms in the meantime.

## 2017-09-08 NOTE — ED Provider Notes (Signed)
[facility] DEPT Provider Note   CSN: [id number] Arrival date & time: 09/08/17  0920     History   Chief Complaint Chief Complaint  Patient presents with  . Exposure to STD    HPI Marcus Boyd is a 72 y.o. male with past medical history significant for hypertension, A. Fib presenting with concerns of sexually transmitted infection.  He reports history of unprotected intercourse and clear penile discharge starting today.  Denies any pain, swelling, lesions, fever, chills, rash, urea or other symptoms.  HPI  Past Medical History:  Diagnosis Date  . Atrial fibrillation, new onset ([place]) 11/16/2011  . ERECTILE DYSFUNCTION 04/10/2007  . Exertional dyspnea    "related to my heart problems"  . Flutter-fibrillation   . Hyperlipemia 11/14/2010  . HYPERTENSION 04/10/2007  . Warfarin anticoagulation 11/16/2011    Patient Active Problem List   Diagnosis Date Noted  . Elevated PSA 12/14/2016  . Warfarin anticoagulation 11/16/2011  . Typical atrial flutter ([place]) 11/16/2011  . Hyperlipidemia 11/14/2010  . Preventative health care 11/14/2010  . ERECTILE DYSFUNCTION 04/10/2007  . Essential hypertension 04/10/2007    Past Surgical History:  Procedure Laterality Date  . ATRIAL FLUTTER ABLATION N/A 03/12/2012   Procedure: ATRIAL FLUTTER ABLATION;  Surgeon: Evans Lance, MD;  Location[facility] C[facility] LAB;  Service: Cardiovascular;  Laterality: N/A;  . RADIOFREQUENCY ABLATION  03/12/12       Home Medications    Prior to Admission medications   Medication Sig Start Date End Date Taking? Authorizing Provider  amLODipine (NORVASC) 10 MG tablet Take 1 tablet (10 mg total) by mouth daily. 12/14/16   Biagio Borg, MD  aspirin 81 MG tablet Take 81 mg by mouth daily.    [provider]  atorvastatin (LIPITOR) 10 MG tablet Take 1 tablet (10 mg total) by mouth daily at 6 PM. 12/14/16   Biagio Borg, MD  hydrochlorothiazide (HYDRODIURIL) 25 MG tablet TAKE 1 TABLET  (25 MG TOTAL) BY MOUTH DAILY. 12/14/16   Biagio Borg, MD  lisinopril (PRINIVIL,ZESTRIL) 40 MG tablet TAKE 1 TABLET (40 MG TOTAL) BY MOUTH DAILY. 12/14/16   Biagio Borg, MD  metoprolol succinate (TOPROL-XL) 100 MG 24 hr tablet TAKE 1 TABLET BY MOUTH EVERY DAY WITH OR IMMEDIATELY AFTER FOOD 12/14/16   Biagio Borg, MD  Multiple Vitamin (MULTIVITAMIN) tablet Take 1 tablet by mouth daily.      [provider]  potassium chloride (KLOR-CON 10) 10 MEQ tablet Take 1 tablet (10 mEq total) by mouth daily. 12/14/16   Biagio Borg, MD    Family History Family History  Problem Relation Age of Onset  . Hypertension Mother   . Cancer Father        unsure what kind of cancer  . Hypertension Sister        twin    Social History Social History   Tobacco Use  . Smoking status: Former Smoker    Packs/day: 1.00    Years: 20.00    Pack years: 20.00    Types: Cigarettes    Last attempt to quit: 08/13/1988    Years since quitting: 29.0  . Smokeless tobacco: Never Used  . Tobacco comment: 03/12/12 "quit smoking in the 1990's"  Substance Use Topics  . Alcohol use: Yes    Alcohol/week: 0.6 oz    Types: 1 Cans of beer per week    Comment: 03/12/12 "once in a blue moon I'll have glass of wine too"  .  Drug use: Yes    Types: Marijuana    Comment: 03/12/12 "last marijuana was overse[place]; 1970's"     Allergies   Patient has no known allergies.   Review of Systems Review of Systems  Constitutional: Negative for chills and fever.  Respiratory: Negative for cough, shortness of breath, wheezing and stridor.   Cardiovascular: Negative for chest pain and palpitations.  Gastrointestinal: Negative for abdominal pain, nausea and vomiting.  Genitourinary: Positive for discharge. Negative for decreased urine volume, difficulty urinating, dysuria, flank pain, frequency, hematuria, penile pain, penile swelling, scrotal swelling and testicular pain.       Small amount of clear discharge starting today.    Musculoskeletal: Negative for arthralgias, back pain, gait problem, joint swelling, myalgias, neck pain and neck stiffness.  Skin: Negative for color change, pallor, rash and wound.     Physical Exam Updated Vital Signs BP (!) 179/89 (BP Location: Left Arm)   Pulse 73   Temp 98.2 F (36.8 C) (Oral)   Resp 18   SpO2 100%   Physical Exam  Constitutional: He appears well-developed and well-nourished. No distress.  Well-appearing, nontoxic afebrile sitting comfortably in the chair in no acute distress.  HENT:  Head: Normocephalic and atraumatic.  Eyes: Conjunctivae are normal. Right eye exhibits no discharge. Left eye exhibits no discharge.  Neck: Normal range of motion.  Cardiovascular: Normal rate and regular rhythm.  Pulmonary/Chest: Effort normal. No respiratory distress.  Abdominal: He exhibits no distension. There is no tenderness.  Genitourinary: Penis normal. No penile tenderness.  Genitourinary Comments: Uncircumcised penis without discharge.  No tenderness palpation, no edema  Musculoskeletal: Normal range of motion. He exhibits no edema.  Neurological: He is alert.  Skin: Skin is warm and dry. No rash noted. He is not diaphoretic. No erythema. No pallor.  Psychiatric: He has a normal mood and affect.  Nursing note and vitals reviewed.    ED Treatments / Results  Labs (all labs ordered are listed, but only abnormal results are displayed) Labs Reviewed  WET PREP, GENITAL - Abnormal; Notable for the following components:      Result Value   WBC, Wet Prep HPF POC FEW (*)    All other components within normal limits  URINALYSIS, ROUTINE W REFLEX MICROSCOPIC  RPR  HIV ANTIBODY (ROUTINE TESTING)  GC/CHLAMYDIA PROBE AMP ([place]) NOT AT [facility]    EKG  EKG Interpretation None       Radiology No results found.  Procedures Procedures (including critical care time)  Medications Ordered in ED Medications - No data to display   Initial Impression /  Assessment and Plan / ED Course  I have reviewed the triage vital signs and the nursing notes.  Pertinent labs & imaging results that were available during my care of the patient were reviewed by me and considered in my medical decision making (see chart for details).     Patient is afebrile without abdominal tenderness, abdominal pain or painful bowel movements to indicate prostatitis.  No tenderness to palpation of the testes or epididymis to suggest orchitis or epididymitis.  STD cultures obtained including HIV, syphilis, gonorrhea and chlamydia.  Patient to be discharged with instructions to follow up with PCP. Discussed importance of using protection when sexually active. Pt understands that they have GC/Chlamydia cultures pending and that they will need to inform all sexual partners if results return positive.   No evidence of active infection, no discharge on exam pain or edema.  No lesions.  It would  be appropriate to wait for culture results before initiating any treatment in this patient.  Discharge home with follow-up with PCP.  Return precautions discussed with patient who understood and agrees with discharge plan.  Final Clinical Impressions(s) / ED Diagnoses   Final diagnoses:  Possible exposure to STD  Penile discharge    ED Discharge Orders    None       Dossie Der 09/08/17 1512    Lacretia Leigh, MD 09/09/17 [phone]

## 2017-09-08 NOTE — ED Notes (Signed)
Bed: WTR6 Expected date:  Expected time:  Means of arrival:  Comments: 

## 2017-09-09 LAB — GC/CHLAMYDIA PROBE AMP (~~LOC~~) NOT AT ARMC
CHLAMYDIA, DNA PROBE: NEGATIVE
NEISSERIA GONORRHEA: NEGATIVE

## 2017-09-09 LAB — RPR: RPR Ser Ql: NONREACTIVE

## 2017-09-10 LAB — HIV ANTIBODY (ROUTINE TESTING W REFLEX): HIV Screen 4th Generation wRfx: NONREACTIVE

## 2017-09-11 ENCOUNTER — Encounter (HOSPITAL_COMMUNITY): Payer: Self-pay | Admitting: Emergency Medicine

## 2017-09-11 ENCOUNTER — Emergency Department (HOSPITAL_COMMUNITY)
Admission: EM | Admit: 2017-09-11 | Discharge: 2017-09-11 | Disposition: A | Discharge status: Home / Self Care | Payer: Medicare Other | Attending: Emergency Medicine | Admitting: Emergency Medicine

## 2017-09-11 DIAGNOSIS — Z87891 Personal history of nicotine dependence: Secondary | ICD-10-CM | POA: Diagnosis not present

## 2017-09-11 DIAGNOSIS — Z7982 Long term (current) use of aspirin: Secondary | ICD-10-CM | POA: Insufficient documentation

## 2017-09-11 DIAGNOSIS — I10 Essential (primary) hypertension: Secondary | ICD-10-CM | POA: Insufficient documentation

## 2017-09-11 DIAGNOSIS — R300 Dysuria: Secondary | ICD-10-CM | POA: Diagnosis present

## 2017-09-11 DIAGNOSIS — R369 Urethral discharge, unspecified: Secondary | ICD-10-CM | POA: Insufficient documentation

## 2017-09-11 DIAGNOSIS — Z79899 Other long term (current) drug therapy: Secondary | ICD-10-CM | POA: Diagnosis not present

## 2017-09-11 LAB — URINALYSIS, ROUTINE W REFLEX MICROSCOPIC
BILIRUBIN URINE: NEGATIVE
GLUCOSE, UA: NEGATIVE mg/dL
Hgb urine dipstick: NEGATIVE
Ketones, ur: NEGATIVE mg/dL
Leukocytes, UA: NEGATIVE
Nitrite: NEGATIVE
PH: 5 (ref 5.0–8.0)
Protein, ur: NEGATIVE mg/dL
Specific Gravity, Urine: 1.023 (ref 1.005–1.030)

## 2017-09-11 MED ORDER — STERILE WATER FOR INJECTION IJ SOLN
INTRAMUSCULAR | Status: AC
  Administered 2017-09-11: 10 mL
  Filled 2017-09-11: qty 10

## 2017-09-11 MED ORDER — DOXYCYCLINE HYCLATE 100 MG PO TABS
100.0000 mg | ORAL_TABLET | Freq: Once | ORAL | Status: AC
  Administered 2017-09-11: 100 mg via ORAL
  Filled 2017-09-11: qty 1

## 2017-09-11 MED ORDER — CEFTRIAXONE SODIUM 250 MG IJ SOLR
250.0000 mg | Freq: Once | INTRAMUSCULAR | Status: AC
  Administered 2017-09-11: 250 mg via INTRAMUSCULAR
  Filled 2017-09-11: qty 250

## 2017-09-11 MED ORDER — DOXYCYCLINE HYCLATE 100 MG PO CAPS: 100.0000 mg | ORAL_CAPSULE | Freq: Two times a day (BID) | ORAL | 0 refills | Status: AC

## 2017-09-11 NOTE — ED Triage Notes (Signed)
Patient reports he was seen on Sunday for penile discharge after unprotected sex. Reports he is returning today because he is now having burning with urination. Denies any other sx.

## 2017-09-11 NOTE — Discharge Instructions (Signed)
You were treated today for possible infection from sexual transmitted disease. Your testing was negative however given your symptoms we will treat you for the next 14 days. This will cover infection of your urethra, prostate and epididymis (testicles).   Follow up with primary care provider in 7-10 days if symptoms do not clear. Return to ED for fevers, chills, worsening pain, abdominal pain, urine retention, rectal pain or discharge. You should notify your recent partners of your symptoms because they should seek treatment and testing. Do not engage in sexual activity until your symptoms have cleared.

## 2017-09-11 NOTE — ED Notes (Signed)
RN reviewed safe sex practices with pt and advised pt to inform his partners to get tested as well.

## 2017-09-11 NOTE — ED Provider Notes (Signed)
[facility] DEPT Provider Note   CSN: [id number] Arrival date & time: 09/11/17  1301     History   Chief Complaint Chief Complaint  Patient presents with  . Dysuria  . Penile Discharge    HPI Marcus Boyd is a 72 y.o. male he is here for evaluation of gradually worsening dysuria for 2 days, associated with white penile discharge. Had intercourse with a new partner last week without condom. Was seen in the emergency department for this 2 days ago and tested for STDs, these were negative. He did not have a urinary tract infection then. He returns today for worsening burning with urination. He denies fevers, chills, abdominal pain, testicular pain, rectal pain or discharge, genital rash or sores. No changes in urine stream. No previous history of UTI. History of chlamydia in the past.   HPI  Past Medical History:  Diagnosis Date  . Atrial fibrillation, new onset ([place]) 11/16/2011  . ERECTILE DYSFUNCTION 04/10/2007  . Exertional dyspnea    "related to my heart problems"  . Flutter-fibrillation   . Hyperlipemia 11/14/2010  . HYPERTENSION 04/10/2007  . Warfarin anticoagulation 11/16/2011    Patient Active Problem List   Diagnosis Date Noted  . Elevated PSA 12/14/2016  . Warfarin anticoagulation 11/16/2011  . Typical atrial flutter ([place]) 11/16/2011  . Hyperlipidemia 11/14/2010  . Preventative health care 11/14/2010  . ERECTILE DYSFUNCTION 04/10/2007  . Essential hypertension 04/10/2007    Past Surgical History:  Procedure Laterality Date  . ATRIAL FLUTTER ABLATION N/A 03/12/2012   Procedure: ATRIAL FLUTTER ABLATION;  Surgeon: Evans Lance, MD;  Location: [facility] CATH LAB;  Service: Cardiovascular;  Laterality: N/A;  . RADIOFREQUENCY ABLATION  03/12/12       Home Medications    Prior to Admission medications   Medication Sig Start Date End Date Taking? Authorizing Provider  amLODipine (NORVASC) 10 MG tablet Take 1 tablet (10 mg total) by mouth  daily. 12/14/16  Yes Biagio Borg, MD  aspirin 81 MG tablet Take 81 mg by mouth daily.   Yes [provider]  atorvastatin (LIPITOR) 10 MG tablet Take 1 tablet (10 mg total) by mouth daily at 6 PM. Patient taking differently: Take 10 mg by mouth every morning.  12/14/16  Yes Biagio Borg, MD  hydrochlorothiazide (HYDRODIURIL) 25 MG tablet TAKE 1 TABLET (25 MG TOTAL) BY MOUTH DAILY. 12/14/16  Yes Biagio Borg, MD  ibuprofen (ADVIL,MOTRIN) 200 MG tablet Take 200 mg by mouth every 6 (six) hours as needed for moderate pain.   Yes [provider]  lisinopril (PRINIVIL,ZESTRIL) 40 MG tablet TAKE 1 TABLET (40 MG TOTAL) BY MOUTH DAILY. 12/14/16  Yes Biagio Borg, MD  metoprolol succinate (TOPROL-XL) 100 MG 24 hr tablet TAKE 1 TABLET BY MOUTH EVERY DAY WITH OR IMMEDIATELY AFTER FOOD 12/14/16  Yes Biagio Borg, MD  Multiple Vitamin (MULTIVITAMIN) tablet Take 1 tablet by mouth daily.     Yes [provider]  potassium chloride (KLOR-CON 10) 10 MEQ tablet Take 1 tablet (10 mEq total) by mouth daily. 12/14/16  Yes Biagio Borg, MD  doxycycline (VIBRAMYCIN) 100 MG capsule Take 1 capsule (100 mg total) by mouth 2 (two) times daily for 14 days. 09/11/17 09/25/17  Kinnie Feil, PA-C    Family History Family History  Problem Relation Age of Onset  . Hypertension Mother   . Cancer Father        unsure what kind of cancer  . Hypertension  Sister        twin    Social History Social History   Tobacco Use  . Smoking status: Former Smoker    Packs/day: 1.00    Years: 20.00    Pack years: 20.00    Types: Cigarettes    Last attempt to quit: 08/13/1988    Years since quitting: 29.0  . Smokeless tobacco: Never Used  . Tobacco comment: 03/12/12 "quit smoking in the 1990's"  Substance Use Topics  . Alcohol use: Yes    Alcohol/week: 0.6 oz    Types: 1 Cans of beer per week    Comment: 03/12/12 "once in a blue moon I'll have glass of wine too"  . Drug use: Yes    Types: Marijuana     Comment: 03/12/12 "last marijuana was overse[place]; 1970's"     Allergies   Patient has no known allergies.   Review of Systems Review of Systems  Genitourinary: Positive for discharge and dysuria.  All other systems reviewed and are negative.    Physical Exam Updated Vital Signs BP (!) 186/99 (BP Location: Left Arm)   Pulse 70   Temp 97.9 F (36.6 C) (Oral)   Resp 18   SpO2 100%   Physical Exam  Constitutional: He is oriented to person, place, and time. He appears well-developed and well-nourished. No distress.  NAD.  HENT:  Head: Normocephalic and atraumatic.  Right Ear: External ear normal.  Left Ear: External ear normal.  Nose: Nose normal.  Eyes: Conjunctivae and EOM are normal. No scleral icterus.  Neck: Normal range of motion. Neck supple.  Cardiovascular: Normal rate, regular rhythm, normal heart sounds and intact distal pulses.  No murmur heard. Pulmonary/Chest: Effort normal and breath sounds normal. He has no wheezes.  Abdominal: Soft. There is no tenderness.  Genitourinary: Uncircumcised. Discharge found.  Genitourinary Comments:  Chaperone present. White/gray meatus discharge. No tenderness, edema, lesions to external genitalia. Nontender testicles/epididymis. Cremasteric reflex intact. No inguinal tenderness. No lower abdominal tenderness.  Musculoskeletal: Normal range of motion. He exhibits no deformity.  Neurological: He is alert and oriented to person, place, and time.  Skin: Skin is warm and dry. Capillary refill takes less than 2 seconds.  Psychiatric: He has a normal mood and affect. His behavior is normal. Judgment and thought content normal.  Nursing note and vitals reviewed.    ED Treatments / Results  Labs (all labs ordered are listed, but only abnormal results are displayed) Labs Reviewed  URINALYSIS, ROUTINE W REFLEX MICROSCOPIC  GC/CHLAMYDIA PROBE AMP ([place]) NOT AT [facility]    EKG  EKG Interpretation None       Radiology No  results found.  Procedures Procedures (including critical care time)  Medications Ordered in ED Medications  cefTRIAXone (ROCEPHIN) injection 250 mg (not administered)  doxycycline (VIBRA-TABS) tablet 100 mg (not administered)     Initial Impression / Assessment and Plan / ED Course  I have reviewed the triage vital signs and the nursing notes.  Pertinent labs & imaging results that were available during my care of the patient were reviewed by me and considered in my medical decision making (see chart for details).     72 year old here for dysuria and penile discharge after unprotected sexual intercourse. GC/chlamydia, trach, urinalysis negative 2 days ago in the emergency department. He was not empirically treated for STDs at that time.  He has scant amount of penile discharge on exam today, otherwise exam is reassuring. We'll recheck urine.  Final Clinical Impressions(s) /  ED Diagnoses   Urine without signs of infection. We'll give her ceftriaxone in ED and discharged with doxycycline to cover for prostatitis/epididymitis. Patient is to follow-up with PCP in the next 7 today's 10 days for reevaluation. Discussed return precautions. Final diagnoses:  Penile discharge    ED Discharge Orders        Ordered    doxycycline (VIBRAMYCIN) 100 MG capsule  2 times daily     09/11/17 2223       Arlean Hopping 09/11/17 2226    Valarie Merino, MD 09/11/17 2351

## 2017-09-12 LAB — GC/CHLAMYDIA PROBE AMP (~~LOC~~) NOT AT ARMC
CHLAMYDIA, DNA PROBE: NEGATIVE
Neisseria Gonorrhea: NEGATIVE

## 2017-12-08 ENCOUNTER — Other Ambulatory Visit: Payer: Self-pay | Admitting: Internal Medicine

## 2017-12-19 NOTE — Progress Notes (Addendum)
Subjective:   Marcus Boyd is a 72 y.o. male who presents for an Initial Medicare Annual Wellness Visit.  Review of Systems  No ROS.  Medicare Wellness Visit. Additional risk factors are reflected in the social history.  Cardiac Risk Factors include: advanced age (>71men, >63 women);dyslipidemia;hypertension;male gender Sleep patterns: feels rested on waking, does not get up to void, gets up 1 times nightly to void and sleeps 7-8 hours nightly.    Home Safety/Smoke Alarms: Feels safe in home. Smoke alarms in place.  Living environment; residence and Firearm Safety: 1-story house/ trailer, no firearms., Lives with wife, no needs for DME, good support system Seat Belt Safety/Bike Helmet: Wears seat belt.    Objective:    Today's Vitals   12/20/17 0816  BP: (!) 146/90  Pulse: 72  Resp: 17  Temp: 97.6 F (36.4 C)  SpO2: 98%  Weight: 225 lb (102.1 kg)  Height: 6' (1.829 m)   Body mass index is 30.52 kg/m.  Advanced Directives 12/20/2017 09/11/2017 09/08/2017 06/09/2016 03/12/2012  Does Patient Have a Medical Advance Directive? No No No No Patient does not have advance directive;Patient would not like information  Would patient like information on creating a medical advance directive? No - Patient declined - No - Patient declined No - patient declined information -    Current Medications (verified) Outpatient Encounter Medications as of 12/20/2017  Medication Sig  . amLODipine (NORVASC) 10 MG tablet Take 1 tablet (10 mg total) by mouth daily.  Marland Kitchen aspirin 81 MG tablet Take 81 mg by mouth daily.  Marland Kitchen atorvastatin (LIPITOR) 10 MG tablet Take 1 tablet (10 mg total) by mouth daily at 6 PM. (Patient taking differently: Take 10 mg by mouth every morning. )  . hydrochlorothiazide (HYDRODIURIL) 25 MG tablet TAKE 1 TABLET (25 MG TOTAL) BY MOUTH DAILY.  Marland Kitchen ibuprofen (ADVIL,MOTRIN) 200 MG tablet Take 200 mg by mouth every 6 (six) hours as needed for moderate pain.  Marland Kitchen lisinopril  (PRINIVIL,ZESTRIL) 40 MG tablet TAKE 1 TABLET (40 MG TOTAL) BY MOUTH DAILY.  . metoprolol succinate (TOPROL-XL) 100 MG 24 hr tablet TAKE 1 TABLET BY MOUTH EVERY DAY WITH OR IMMEDIATELY AFTER FOOD  . Multiple Vitamin (MULTIVITAMIN) tablet Take 1 tablet by mouth daily.    . potassium chloride (KLOR-CON 10) 10 MEQ tablet Take 1 tablet (10 mEq total) by mouth daily.   No facility-administered encounter medications on file as of 12/20/2017.     Allergies (verified) Patient has no known allergies.   History: Past Medical History:  Diagnosis Date  . Atrial fibrillation, new onset ([place]) 11/16/2011  . ERECTILE DYSFUNCTION 04/10/2007  . Exertional dyspnea    "related to my heart problems"  . Flutter-fibrillation   . Hyperlipemia 11/14/2010  . HYPERTENSION 04/10/2007  . Warfarin anticoagulation 11/16/2011   Past Surgical History:  Procedure Laterality Date  . ATRIAL FLUTTER ABLATION N/A 03/12/2012   Procedure: ATRIAL FLUTTER ABLATION;  Surgeon: Evans Lance, MD;  Location: [facility] CATH LAB;  Service: Cardiovascular;  Laterality: N/A;  . RADIOFREQUENCY ABLATION  03/12/12   Family History  Problem Relation Age of Onset  . Hypertension Mother   . Cancer Father        unsure what kind of cancer  . Hypertension Sister        twin   Social History   Socioeconomic History  . Marital status: Married    Spouse name: Not on file  . Number of children: 4  . Years of education: Not  on file  . Highest education level: Not on file  Occupational History  . Occupation: Truck Education administrator: [employer]  . Financial resource strain: Not hard at all  . Food insecurity:    Worry: Never true    Inability: Never true  . Transportation needs:    Medical: No    Non-medical: No  Tobacco Use  . Smoking status: Former Smoker    Packs/day: 1.00    Years: 20.00    Pack years: 20.00    Types: Cigarettes    Last attempt to quit: 08/13/1988    Years since quitting: 29.3  . Smokeless  tobacco: Never Used  . Tobacco comment: 03/12/12 "quit smoking in the 1990's"  Substance and Sexual Activity  . Alcohol use: Yes    Alcohol/week: 0.6 oz    Types: 1 Cans of beer per week    Comment: 03/12/12 "once in a blue moon I'll have glass of [place] Paolucci too"  . Drug use: Not Currently    Types: Marijuana    Comment: 03/12/12 "last marijuana was [place]; 1970's"  . Sexual activity: Yes  Lifestyle  . Physical activity:    Days per week: 4 days    Minutes per session: 50 min  . Stress: Not at all  Relationships  . Social connections:    Talks on phone: More than three times a week    Gets together: More than three times a week    Attends religious service: More than 4 times per year    Active member of club or organization: Yes    Attends meetings of clubs or organizations: More than 4 times per year    Relationship status: Married  Other Topics Concern  . Not on file  Social History Narrative  . Not on file   Tobacco Counseling Counseling given: Not Answered Comment: 03/12/12 "quit smoking in the 1990's"    Activities of Daily Living In your present state of health, do you have any difficulty performing the following activities: 12/20/2017  Hearing? N  Vision? N  Difficulty concentrating or making decisions? N  Walking or climbing stairs? N  Dressing or bathing? N  Doing errands, shopping? N  Preparing Food and eating ? N  Using the Toilet? N  In the past six months, have you accidently leaked urine? N  Do you have problems with loss of bowel control? N  Managing your Medications? N  Managing your Finances? N  Housekeeping or managing your Housekeeping? N  Some recent data might be hidden     Immunizations and Health Maintenance Immunization History  Administered Date(s) Administered  . Pneumococcal Conjugate-13 11/27/2013  . Pneumococcal Polysaccharide-23 12/14/2016  . Td 08/13/2004  . Tdap 12/09/2015  . Varicella Zoster Immune Globulin 11/14/2010   There are  no preventive care reminders to display for this patient.  Patient Care Team: Biagio Borg, MD as PCP - General  Indicate any recent Medical Services you may have received from other than [facility] providers in the past year (date may be approximate).    Assessment:   This is a routine wellness examination for [place]. Physical assessment deferred to PCP.   Hearing/Vision screen Hearing Screening Comments: Able to hear conversational tones w/o difficulty. Passed whisper test Vision Screening Comments: appointment yearly [facility]  Dietary issues and exercise activities discussed: Current Exercise Habits: Home exercise routine, Type of exercise: treadmill;walking, Time (Minutes): 45, Frequency (Times/Week): 4, Weekly Exercise (Minutes/Week): 180, Intensity: Mild, Exercise  limited by: None identified  Diet (meal preparation, eat out, water intake, caffeinated beverages, dairy products, fruits and vegetables): in general, a "healthy" diet  , well balanced   Reviewed heart healthy diet, encouraged patient to increase daily water intake.  Goals    . Patient Stated     Cut down the amount of salt I consume, eat more vegetables, eat mainly bake meat, continue to drink 2-3 bottles of water daily. Enjoy life and family.      Depression Screen PHQ 2/9 Scores 12/20/2017 12/14/2016 12/09/2015 12/02/2014  PHQ - 2 Score 0 0 0 0  PHQ- 9 Score 0 - - -    Fall Risk Fall Risk  12/20/2017 12/14/2016 12/09/2015 12/02/2014 11/27/2013  Falls in the past year? No No No No No    Cognitive Function: MMSE - Mini Mental State Exam 12/20/2017  Orientation to time 5  Orientation to Place 5  Registration 3  Attention/ Calculation 5  Recall 2  Language- name 2 objects 2  Language- repeat 1  Language- follow 3 step command 3  Language- read & follow direction 1  Write a sentence 1  Copy design 1  Total score 29        Screening Tests Health Maintenance  Topic Date Due  . INFLUENZA VACCINE  03/13/2018  .  COLONOSCOPY  04/22/2018  . TETANUS/TDAP  12/08/2025  . Hepatitis C Screening  Completed  . PNA vac Low Risk Adult  Completed      Plan:    Continue doing brain stimulating activities (puzzles, reading, adult coloring books, staying active) to keep memory sharp.   Continue to eat heart healthy diet (full of fruits, vegetables, whole grains, lean protein, water--limit salt, fat, and sugar intake) and increase physical activity as tolerated.   I have personally reviewed and noted the following in the patient's chart:   . Medical and social history . Use of alcohol, tobacco or illicit drugs  . Current medications and supplements . Functional ability and status . Nutritional status . Physical activity . Advanced directives . List of other physicians . Vitals . Screenings to include cognitive, depression, and falls . Referrals and appointments  In addition, I have reviewed and discussed with patient certain preventive protocols, quality metrics, and best practice recommendations. A written personalized care plan for preventive services as well as general preventive health recommendations were provided to patient.     Michiel Cowboy, RN   12/20/2017    Medical screening examination/treatment/procedure(s) were performed by non-physician practitioner and as supervising physician I was immediately available for consultation/collaboration. I agree with above. Cathlean Cower, MD

## 2017-12-20 ENCOUNTER — Encounter: Payer: Self-pay | Admitting: Internal Medicine

## 2017-12-20 ENCOUNTER — Ambulatory Visit (INDEPENDENT_AMBULATORY_CARE_PROVIDER_SITE_OTHER): Payer: Medicare Other | Admitting: Internal Medicine

## 2017-12-20 ENCOUNTER — Other Ambulatory Visit (INDEPENDENT_AMBULATORY_CARE_PROVIDER_SITE_OTHER): Payer: Medicare Other

## 2017-12-20 ENCOUNTER — Ambulatory Visit (INDEPENDENT_AMBULATORY_CARE_PROVIDER_SITE_OTHER): Payer: Medicare Other | Admitting: *Deleted

## 2017-12-20 VITALS — BP 146/90 | HR 72 | Temp 97.6°F | Resp 17 | Ht 72.0 in | Wt 225.0 lb

## 2017-12-20 VITALS — BP 132/72 | HR 79 | Temp 98.8°F | Ht 72.0 in | Wt 223.0 lb

## 2017-12-20 DIAGNOSIS — I10 Essential (primary) hypertension: Secondary | ICD-10-CM | POA: Diagnosis not present

## 2017-12-20 DIAGNOSIS — Z0000 Encounter for general adult medical examination without abnormal findings: Secondary | ICD-10-CM

## 2017-12-20 DIAGNOSIS — R739 Hyperglycemia, unspecified: Secondary | ICD-10-CM | POA: Diagnosis not present

## 2017-12-20 LAB — PSA: PSA: 2.84 ng/mL (ref 0.10–4.00)

## 2017-12-20 LAB — BASIC METABOLIC PANEL
BUN: 11 mg/dL (ref 6–23)
CALCIUM: 9.3 mg/dL (ref 8.4–10.5)
CHLORIDE: 103 meq/L (ref 96–112)
CO2: 29 mEq/L (ref 19–32)
Creatinine, Ser: 0.97 mg/dL (ref 0.40–1.50)
GFR: 98.26 mL/min (ref >60.00)
Glucose, Bld: 85 mg/dL (ref 70–99)
POTASSIUM: 3.5 meq/L (ref 3.5–5.1)
Sodium: 140 mEq/L (ref 135–145)

## 2017-12-20 LAB — CBC WITH DIFFERENTIAL/PLATELET
BASOS PCT: 0.5 % (ref 0.0–3.0)
Basophils Absolute: 0 10*3/uL (ref 0.0–0.1)
EOS ABS: 0.1 10*3/uL (ref 0.0–0.7)
Eosinophils Relative: 1.2 % (ref 0.0–5.0)
HCT: 41.5 % (ref 39.0–52.0)
Hemoglobin: 14.1 g/dL (ref 13.0–17.0)
LYMPHS ABS: 1.8 10*3/uL (ref 0.7–4.0)
Lymphocytes Relative: 32.4 % (ref 12.0–46.0)
MCHC: 33.9 g/dL (ref 30.0–36.0)
MCV: 86.6 fl (ref 78.0–100.0)
MONO ABS: 0.7 10*3/uL (ref 0.1–1.0)
Monocytes Relative: 12.1 % — ABNORMAL HIGH (ref 3.0–12.0)
NEUTROS ABS: 3 10*3/uL (ref 1.4–7.7)
NEUTROS PCT: 53.8 % (ref 43.0–77.0)
Platelets: 281 10*3/uL (ref 150.0–400.0)
RBC: 4.79 Mil/uL (ref 4.22–5.81)
RDW: 14.1 % (ref 11.5–15.5)
WBC: 5.6 10*3/uL (ref 4.0–10.5)

## 2017-12-20 LAB — URINALYSIS, ROUTINE W REFLEX MICROSCOPIC
BILIRUBIN URINE: NEGATIVE
HGB URINE DIPSTICK: NEGATIVE
KETONES UR: NEGATIVE
Leukocytes, UA: NEGATIVE
NITRITE: NEGATIVE
RBC / HPF: NONE SEEN (ref 0–?)
Specific Gravity, Urine: 1.02 (ref 1.000–1.030)
Total Protein, Urine: NEGATIVE
URINE GLUCOSE: NEGATIVE
UROBILINOGEN UA: 0.2 (ref 0.0–1.0)
WBC UA: NONE SEEN (ref 0–?)
pH: 5.5 (ref 5.0–8.0)

## 2017-12-20 LAB — HEPATIC FUNCTION PANEL
ALT: 14 U/L (ref 0–53)
AST: 17 U/L (ref 0–37)
Albumin: 4 g/dL (ref 3.5–5.2)
Alkaline Phosphatase: 45 U/L (ref 39–117)
BILIRUBIN DIRECT: 0.1 mg/dL (ref 0.0–0.3)
BILIRUBIN TOTAL: 0.3 mg/dL (ref 0.2–1.2)
Total Protein: 6.9 g/dL (ref 6.0–8.3)

## 2017-12-20 LAB — LIPID PANEL
CHOLESTEROL: 143 mg/dL (ref 0–200)
HDL: 30 mg/dL — ABNORMAL LOW (ref >39.00)
NonHDL: 112.53
TRIGLYCERIDES: 203 mg/dL — AB (ref 0.0–149.0)
Total CHOL/HDL Ratio: 5
VLDL: 40.6 mg/dL — ABNORMAL HIGH (ref 0.0–40.0)

## 2017-12-20 LAB — HEMOGLOBIN A1C: Hgb A1c MFr Bld: 6 % (ref 4.6–6.5)

## 2017-12-20 LAB — LDL CHOLESTEROL, DIRECT: LDL DIRECT: 85 mg/dL

## 2017-12-20 LAB — TSH: TSH: 0.76 u[IU]/mL (ref 0.35–4.50)

## 2017-12-20 NOTE — Progress Notes (Signed)
Subjective:    Patient ID: Marcus Boyd, male    DOB: 1945-10-18, 72 y.o.   MRN: [id number]  HPI  Here for wellness and f/u;  Overall doing ok;  Pt denies Chest pain, worsening SOB, DOE, wheezing, orthopnea, PND, worsening LE edema, palpitations, dizziness or syncope.  Pt denies neurological change such as new headache, facial or extremity weakness.  Pt denies polydipsia, polyuria, or low sugar symptoms. Pt states overall good compliance with treatment and medications, good tolerability, and has been trying to follow appropriate diet.  Pt denies worsening depressive symptoms, suicidal ideation or panic. No fever, night sweats, wt loss, loss of appetite, or other constitutional symptoms.  Pt states good ability with ADL's, has low fall risk, home safety reviewed and adequate, no other significant changes in hearing or vision, and only occasionally active with exercise. Excercises on treadmill and walking.  Wt Readings from Last 3 Encounters:  12/20/17 223 lb (101.2 kg)  12/20/17 225 lb (102.1 kg)  12/14/16 223 lb (101.2 kg)  No other new complaints or interval hx Past Medical History:  Diagnosis Date  . Atrial fibrillation, new onset ([place]) 11/16/2011  . ERECTILE DYSFUNCTION 04/10/2007  . Exertional dyspnea    "related to my heart problems"  . Flutter-fibrillation   . Hyperlipemia 11/14/2010  . HYPERTENSION 04/10/2007  . Warfarin anticoagulation 11/16/2011   Past Surgical History:  Procedure Laterality Date  . ATRIAL FLUTTER ABLATION N/A 03/12/2012   Procedure: ATRIAL FLUTTER ABLATION;  Surgeon: Evans Lance, MD;  Location[facility] C[facility] LAB;  Service: Cardiovascular;  Laterality: N/A;  . RADIOFREQUENCY ABLATION  03/12/12    reports that he quit smoking about 29 years ago. His smoking use included cigarettes. He has a 20.00 pack-year smoking history. He has never used smokeless tobacco. He reports that he drinks about 0.6 oz of alcohol per week. He reports that he has current or past drug history.  Drug: Marijuana. family history includes Cancer in his father; Hypertension in his mother and sister. No Known Allergies Current Outpatient Medications on File Prior to Visit  Medication Sig Dispense Refill  . amLODipine (NORVASC) 10 MG tablet Take 1 tablet (10 mg total) by mouth daily. 90 tablet 3  . aspirin 81 MG tablet Take 81 mg by mouth daily.    Marland Kitchen atorvastatin (LIPITOR) 10 MG tablet Take 1 tablet (10 mg total) by mouth daily at 6 PM. (Patient taking differently: Take 10 mg by mouth every morning. ) 90 tablet 3  . hydrochlorothiazide (HYDRODIURIL) 25 MG tablet TAKE 1 TABLET (25 MG TOTAL) BY MOUTH DAILY. 90 tablet 3  . ibuprofen (ADVIL,MOTRIN) 200 MG tablet Take 200 mg by mouth every 6 (six) hours as needed for moderate pain.    Marland Kitchen lisinopril (PRINIVIL,ZESTRIL) 40 MG tablet TAKE 1 TABLET (40 MG TOTAL) BY MOUTH DAILY. 90 tablet 3  . metoprolol succinate (TOPROL-XL) 100 MG 24 hr tablet TAKE 1 TABLET BY MOUTH EVERY DAY WITH OR IMMEDIATELY AFTER FOOD 90 tablet 3  . Multiple Vitamin (MULTIVITAMIN) tablet Take 1 tablet by mouth daily.      . potassium chloride (KLOR-CON 10) 10 MEQ tablet Take 1 tablet (10 mEq total) by mouth daily. 90 tablet 3   No current facility-administered medications on file prior to visit.    Review of Systems Constitutional: Negative for other unusual diaphoresis, sweats, appetite or weight changes HENT: Negative for other worsening hearing loss, ear pain, facial swelling, mouth sores or neck stiffness.   Eyes: Negative for other worsening  pain, redness or other visual disturbance.  Respiratory: Negative for other stridor or swelling Cardiovascular: Negative for other palpitations or other chest pain  Gastrointestinal: Negative for worsening diarrhea or loose stools, blood in stool, distention or other pain Genitourinary: Negative for hematuria, flank pain or other change in urine volume.  Musculoskeletal: Negative for myalgias or other joint swelling.  Skin: Negative  for other color change, or other wound or worsening drainage.  Neurological: Negative for other syncope or numbness. Hematological: Negative for other adenopathy or swelling Psychiatric/Behavioral: Negative for hallucinations, other worsening agitation, SI, self-injury, or new decreased concentration All other system neg per pt    Objective:   Physical Exam BP 132/72 (BP Location: Left Arm, Patient Position: Sitting, Cuff Size: Large)   Pulse 79   Temp 98.8 F (37.1 C) (Oral)   Ht 6' (1.829 m)   Wt 223 lb (101.2 kg)   SpO2 99%   BMI 30.24 kg/m  VS noted,  Constitutional: Pt is oriented to person, place, and time. Appears well-developed and well-nourished, in no significant distress and comfortable Head: Normocephalic and atraumatic  Eyes: Conjunctivae and EOM are normal. Pupils are equal, round, and reactive to light Right Ear: External ear normal without discharge Left Ear: External ear normal without discharge Nose: Nose without discharge or deformity Mouth/Throat: Oropharynx is without other ulcerations and moist  Neck: Normal range of motion. Neck supple. No JVD present. No tracheal deviation present or significant neck LA or mass Cardiovascular: Normal rate, regular rhythm, normal heart sounds and intact distal pulses.   Pulmonary/Chest: WOB normal and breath sounds without rales or wheezing  Abdominal: Soft. Bowel sounds are normal. NT. No HSM  Musculoskeletal: Normal range of motion. Exhibits no edema Lymphadenopathy: Has no other cervical adenopathy.  Neurological: Pt is alert and oriented to person, place, and time. Pt has normal reflexes. No cranial nerve deficit. Motor grossly intact, Gait intact Skin: Skin is warm and dry. No rash noted or new ulcerations Psychiatric:  Has normal mood and affect. Behavior is normal without agitation No other exam findings    Assessment & Plan:

## 2017-12-20 NOTE — Patient Instructions (Signed)

## 2017-12-20 NOTE — Patient Instructions (Addendum)
Continue doing brain stimulating activities (puzzles, reading, adult coloring books, staying active) to keep memory sharp.   Continue to eat heart healthy diet (full of fruits, vegetables, whole grains, lean protein, water--limit salt, fat, and sugar intake) and increase physical activity as tolerated.   Marcus Boyd , Thank you for taking time to come for your Medicare Wellness Visit. I appreciate your ongoing commitment to your health goals. Please review the following plan we discussed and let me know if I can assist you in the future.   These are the goals we discussed: Goals    . Patient Stated     Cut down the amount of salt, eat more vegetables, eat mainly bake meat, continue to drink 2-3 bottles of water daily. Enjoy life and family.       This is a list of the screening recommended for you and due dates:  Health Maintenance  Topic Date Due  . Flu Shot  03/13/2018  . Colon Cancer Screening  04/22/2018  . Tetanus Vaccine  12/08/2025  .  Hepatitis C: One time screening is recommended by Center for Disease Control  (CDC) for  adults born from [birth year] through 1964.   Completed  . Pneumonia vaccines  Completed     Health Maintenance, Male A healthy lifestyle and preventive care is important for your health and wellness. Ask your health care provider about what schedule of regular examinations is right for you. What should I know about weight and diet? Eat a Healthy Diet  Eat plenty of vegetables, fruits, whole grains, low-fat dairy products, and lean protein.  Do not eat a lot of foods high in solid fats, added sugars, or salt.  Maintain a Healthy Weight Regular exercise can help you achieve or maintain a healthy weight. You should:  Do at least 150 minutes of exercise each week. The exercise should increase your heart rate and make you sweat (moderate-intensity exercise).  Do strength-training exercises at least twice a week.  Watch Your Levels of Cholesterol and Blood  Lipids  Have your blood tested for lipids and cholesterol every 5 years starting at 71 years of age. If you are at high risk for heart disease, you should start having your blood tested when you are 71 years old. You may need to have your cholesterol levels checked more often if: ? Your lipid or cholesterol levels are high. ? You are older than 71 years of age. ? You are at high risk for heart disease.  What should I know about cancer screening? Many types of cancers can be detected early and may often be prevented. Lung Cancer  You should be screened every year for lung cancer if: ? You are a current smoker who has smoked for at least 30 years. ? You are a former smoker who has quit within the past 15 years.  Talk to your health care provider about your screening options, when you should start screening, and how often you should be screened.  Colorectal Cancer  Routine colorectal cancer screening usually begins at 71 years of age and should be repeated every 5-10 years until you are 71 years old. You may need to be screened more often if early forms of precancerous polyps or small growths are found. Your health care provider may recommend screening at an earlier age if you have risk factors for colon cancer.  Your health care provider may recommend using home test kits to check for hidden blood in the stool.  A small  camera at the end of a tube can be used to examine your colon (sigmoidoscopy or colonoscopy). This checks for the earliest forms of colorectal cancer.  Prostate and Testicular Cancer  Depending on your age and overall health, your health care provider may do certain tests to screen for prostate and testicular cancer.  Talk to your health care provider about any symptoms or concerns you have about testicular or prostate cancer.  Skin Cancer  Check your skin from head to toe regularly.  Tell your health care provider about any new moles or changes in moles, especially  if: ? There is a change in a mole's size, shape, or color. ? You have a mole that is larger than a pencil eraser.  Always use sunscreen. Apply sunscreen liberally and repeat throughout the day.  Protect yourself by wearing long sleeves, pants, a wide-brimmed hat, and sunglasses when outside.  What should I know about heart disease, diabetes, and high blood pressure?  If you are 65-91 years of age, have your blood pressure checked every 3-5 years. If you are 69 years of age or older, have your blood pressure checked every year. You should have your blood pressure measured twice-once when you are at a hospital or clinic, and once when you are not at a hospital or clinic. Record the average of the two measurements. To check your blood pressure when you are not at a hospital or clinic, you can use: ? An automated blood pressure machine at a pharmacy. ? A home blood pressure monitor.  Talk to your health care provider about your target blood pressure.  If you are between 75-56 years old, ask your health care provider if you should take aspirin to prevent heart disease.  Have regular diabetes screenings by checking your fasting blood sugar level. ? If you are at a normal weight and have a low risk for diabetes, have this test once every three years after the age of 63. ? If you are overweight and have a high risk for diabetes, consider being tested at a younger age or more often.  A one-time screening for abdominal aortic aneurysm (AAA) by ultrasound is recommended for men aged 43-75 years who are current or former smokers. What should I know about preventing infection? Hepatitis B If you have a higher risk for hepatitis B, you should be screened for this virus. Talk with your health care provider to find out if you are at risk for hepatitis B infection. Hepatitis C Blood testing is recommended for:  Everyone born from [birth year] through 1964.  Anyone with known risk factors for hepatitis  C.  Sexually Transmitted Diseases (STDs)  You should be screened each year for STDs including gonorrhea and chlamydia if: ? You are sexually active and are younger than 71 years of age. ? You are older than 71 years of age and your health care provider tells you that you are at risk for this type of infection. ? Your sexual activity has changed since you were last screened and you are at an increased risk for chlamydia or gonorrhea. Ask your health care provider if you are at risk.  Talk with your health care provider about whether you are at high risk of being infected with HIV. Your health care provider may recommend a prescription medicine to help prevent HIV infection.  What else can I do?  Schedule regular health, dental, and eye exams.  Stay current with your vaccines (immunizations).  Do not use any tobacco  products, such as cigarettes, chewing tobacco, and e-cigarettes. If you need help quitting, ask your health care provider.  Limit alcohol intake to no more than 2 drinks per day. One drink equals 12 ounces of beer, 5 ounces of [place], or 1 ounces of hard liquor.  Do not use street drugs.  Do not share needles.  Ask your health care provider for help if you need support or information about quitting drugs.  Tell your health care provider if you often feel depressed.  Tell your health care provider if you have ever been abused or do not feel safe at home. This information is not intended to replace advice given to you by your health care provider. Make sure you discuss any questions you have with your health care provider. Document Released: 01/26/2008 Document Revised: 03/28/2016 Document Reviewed: 05/03/2015 Elsevier Interactive Patient Education  [employer].

## 2017-12-21 NOTE — Assessment & Plan Note (Signed)
Asympt, mild, for a1c with labs

## 2017-12-21 NOTE — Assessment & Plan Note (Signed)

## 2017-12-21 NOTE — Assessment & Plan Note (Signed)
BP Readings from Last 3 Encounters:  12/20/17 132/72  12/20/17 (!) 146/90  09/11/17 (!) 186/99  stable overall by history and exam, recent data reviewed with pt, and pt to continue medical treatment as before,  to f/u any worsening symptoms or concerns

## 2017-12-22 ENCOUNTER — Other Ambulatory Visit: Payer: Self-pay | Admitting: Internal Medicine

## 2018-01-15 ENCOUNTER — Other Ambulatory Visit: Payer: Self-pay | Admitting: Internal Medicine

## 2018-01-22 ENCOUNTER — Other Ambulatory Visit: Payer: Self-pay | Admitting: Internal Medicine

## 2018-03-19 ENCOUNTER — Other Ambulatory Visit: Payer: Self-pay | Admitting: Internal Medicine

## 2018-09-05 ENCOUNTER — Encounter: Payer: Self-pay | Admitting: Internal Medicine

## 2018-09-26 ENCOUNTER — Encounter: Payer: Self-pay | Admitting: Internal Medicine

## 2018-09-26 ENCOUNTER — Ambulatory Visit: Payer: Medicare Other | Admitting: Internal Medicine

## 2018-09-26 VITALS — BP 140/78 | HR 78 | Ht 72.0 in | Wt 223.0 lb

## 2018-09-26 DIAGNOSIS — E785 Hyperlipidemia, unspecified: Secondary | ICD-10-CM | POA: Diagnosis not present

## 2018-09-26 DIAGNOSIS — I483 Typical atrial flutter: Secondary | ICD-10-CM | POA: Diagnosis not present

## 2018-09-26 DIAGNOSIS — I10 Essential (primary) hypertension: Secondary | ICD-10-CM

## 2018-09-26 NOTE — Patient Instructions (Signed)

## 2018-09-26 NOTE — Progress Notes (Signed)
HPI [name] returns today for followup. He is a very pleasant 73 year old man with hypertension and atrial flutter who underwent catheter ablation several years ago. He is here today to re-establish. In the interim he has done well. He has retired but is working part time. No edema, chest pain or sob.   No Known Allergies   Current Outpatient Medications  Medication Sig Dispense Refill  . amLODipine (NORVASC) 10 MG tablet Take 1 tablet (10 mg total) by mouth daily. 90 tablet 3  . aspirin 81 MG tablet Take 81 mg by mouth daily.    Marland Kitchen atorvastatin (LIPITOR) 10 MG tablet TAKE 1 TABLET (10 MG TOTAL) BY MOUTH DAILY AT 6 PM. 90 tablet 3  . hydrochlorothiazide (HYDRODIURIL) 25 MG tablet TAKE 1 TABLET BY MOUTH EVERY DAY 90 tablet 3  . ibuprofen (ADVIL,MOTRIN) 200 MG tablet Take 200 mg by mouth every 6 (six) hours as needed for moderate pain.    Marland Kitchen KLOR-CON 10 10 MEQ tablet TAKE 1 TABLET BY MOUTH EVERY DAY 90 tablet 3  . lisinopril (PRINIVIL,ZESTRIL) 40 MG tablet TAKE 1 TABLET BY MOUTH EVERY DAY 90 tablet 3  . metoprolol succinate (TOPROL-XL) 100 MG 24 hr tablet TAKE 1 TABLET BY MOUTH EVERY DAY WITH OR IMMEDIATELY AFTER FOOD 90 tablet 3  . Multiple Vitamin (MULTIVITAMIN) tablet Take 1 tablet by mouth daily.       No current facility-administered medications for this visit.      Past Medical History:  Diagnosis Date  . Atrial fibrillation, new onset ([place]) 11/16/2011  . ERECTILE DYSFUNCTION 04/10/2007  . Exertional dyspnea    "related to my heart problems"  . Flutter-fibrillation ([place])   . Hyperlipemia 11/14/2010  . HYPERTENSION 04/10/2007  . Warfarin anticoagulation 11/16/2011    ROS:   All systems reviewed and negative except as noted in the HPI.   Past Surgical History:  Procedure Laterality Date  . ATRIAL FLUTTER ABLATION N/A 03/12/2012   Procedure: ATRIAL FLUTTER ABLATION;  Surgeon: Evans Lance, MD;  Location: [facility] CATH LAB;  Service: Cardiovascular;  Laterality: N/A;  .  RADIOFREQUENCY ABLATION  03/12/12     Family History  Problem Relation Age of Onset  . Hypertension Mother   . Cancer Father        unsure what kind of cancer  . Hypertension Sister        twin     Social History   Socioeconomic History  . Marital status: Married    Spouse name: Not on file  . Number of children: 4  . Years of education: Not on file  . Highest education level: Not on file  Occupational History  . Occupation: Truck Education administrator: [employer]  . Financial resource strain: Not hard at all  . Food insecurity:    Worry: Never true    Inability: Never true  . Transportation needs:    Medical: No    Non-medical: No  Tobacco Use  . Smoking status: Former Smoker    Packs/day: 1.00    Years: 20.00    Pack years: 20.00    Types: Cigarettes    Last attempt to quit: 08/13/1988    Years since quitting: 30.1  . Smokeless tobacco: Never Used  . Tobacco comment: 03/12/12 "quit smoking in the 1990's"  Substance and Sexual Activity  . Alcohol use: Yes    Alcohol/week: 1.0 standard drinks    Types: 1 Cans of beer  per week    Comment: 03/12/12 "once in a blue moon I'll have glass of wine too"  . Drug use: Not Currently    Types: Marijuana    Comment: 03/12/12 "last marijuana was oversees; 1970's"  . Sexual activity: Yes  Lifestyle  . Physical activity:    Days per week: 4 days    Minutes per session: 50 min  . Stress: Not at all  Relationships  . Social connections:    Talks on phone: More than three times a week    Gets together: More than three times a week    Attends religious service: More than 4 times per year    Active member of club or organization: Yes    Attends meetings of clubs or organizations: More than 4 times per year    Relationship status: Married  . Intimate partner violence:    Fear of current or ex partner: No    Emotionally abused: No    Physically abused: No    Forced sexual activity: No  Other Topics Concern  .  Not on file  Social History Narrative  . Not on file     BP 140/78   Pulse 78   Ht 6' (1.829 m)   Wt 223 lb (101.2 kg)   SpO2 96%   BMI 30.24 kg/m   Physical Exam:  Well appearing NAD HEENT: Unremarkable Neck:  No JVD, no thyromegally Lymphatics:  No adenopathy Back:  No CVA tenderness Lungs:  Clear with no wheezes HEART:  Regular rate rhythm, no murmurs, no rubs, no clicks Abd:  soft, positive bowel sounds, no organomegally, no rebound, no guarding Ext:  2 plus pulses, no edema, no cyanosis, no clubbing Skin:  No rashes no nodules Neuro:  CN II through XII intact, motor grossly intact  EKG - nsr with LVH    Assess/Plan: 1. HTN - his blood pressure is fairly well controlled. He is encouraged to maintain a low sodium diet. He is exercising regularly. 2. Atrial flutter - he is s/p catheter ablation remotely and has had no recurrent atrial arrhythmias. 3. Dyslipidemia - he will continue statin therapy.  Mikle Bosworth.D.

## 2018-12-06 ENCOUNTER — Other Ambulatory Visit: Payer: Self-pay | Admitting: Internal Medicine

## 2018-12-24 NOTE — Progress Notes (Addendum)
Subjective:   Marcus Boyd is a 74 y.o. male who presents for Medicare Annual/Subsequent preventive examination.  Review of Systems:  No ROS.  Medicare Wellness Virtual Visit.  Visual/audio telehealth visit, UTA vital signs.   See social history for additional risk factors. I connected with patient by a telephone and verified that I am speaking with the correct person using two identifiers. Patient stated full name and DOB. Patient gave permission to continue with telephonic visit. Patient's location was at home and Nurse's location was at [place] office.   Cardiac Risk Factors include: advanced age (>31men, >69 women);dyslipidemia;hypertension;male gender Sleep patterns: feels rested on waking, gets up 0-1 times nightly to void and sleeps 6-7 hours nightly.    Home Safety/Smoke Alarms: Feels safe in home. Smoke alarms in place.  Living environment; residence and Firearm Safety: apartment, no firearms, firearms stored safely. Lives alone, no needs for DME, good support system  Seat Belt Safety/Bike Helmet: Wears seat belt.   PSA-  Lab Results  Component Value Date   PSA 2.84 12/20/2017   PSA 3.11 12/14/2016   PSA 5.17 (H) 12/09/2015       Objective:    Vitals: There were no vitals taken for this visit.  There is no height or weight on file to calculate BMI.  Advanced Directives 12/25/2018 12/20/2017 09/11/2017 09/08/2017 06/09/2016 03/12/2012  Does Patient Have a Medical Advance Directive? No No No No No Patient does not have advance directive;Patient would not like information  Would patient like information on creating a medical advance directive? Yes (ED - Information included in AVS) No - Patient declined - No - Patient declined No - patient declined information -    Tobacco Social History   Tobacco Use  Smoking Status Former Smoker  . Packs/day: 1.00  . Years: 20.00  . Pack years: 20.00  . Types: Cigarettes  . Last attempt to quit: 08/13/1988  . Years since  quitting: 30.3  Smokeless Tobacco Never Used  Tobacco Comment   03/12/12 "quit smoking in the 1990's"     Counseling given: Not Answered Comment: 03/12/12 "quit smoking in the 1990's"  Past Medical History:  Diagnosis Date  . Atrial fibrillation, new onset ([place]) 11/16/2011  . ERECTILE DYSFUNCTION 04/10/2007  . Exertional dyspnea    "related to my heart problems"  . Flutter-fibrillation ([place])   . Hyperlipemia 11/14/2010  . HYPERTENSION 04/10/2007  . Warfarin anticoagulation 11/16/2011   Past Surgical History:  Procedure Laterality Date  . ATRIAL FLUTTER ABLATION N/A 03/12/2012   Procedure: ATRIAL FLUTTER ABLATION;  Surgeon: Evans Lance, MD;  Location: [facility] CATH LAB;  Service: Cardiovascular;  Laterality: N/A;  . RADIOFREQUENCY ABLATION  03/12/12   Family History  Problem Relation Age of Onset  . Hypertension Mother   . Cancer Father        unsure what kind of cancer  . Hypertension Sister        twin   Social History   Socioeconomic History  . Marital status: Divorced    Spouse name: Not on file  . Number of children: 4  . Years of education: Not on file  . Highest education level: Not on file  Occupational History  . Occupation: Truck driver/ retired    Fish farm manager: [employer]  Social Needs  . Financial resource strain: Not hard at all  . Food insecurity:    Worry: Never true    Inability: Never true  . Transportation needs:    Medical: No  Non-medical: No  Tobacco Use  . Smoking status: Former Smoker    Packs/day: 1.00    Years: 20.00    Pack years: 20.00    Types: Cigarettes    Last attempt to quit: 08/13/1988    Years since quitting: 30.3  . Smokeless tobacco: Never Used  . Tobacco comment: 03/12/12 "quit smoking in the 1990's"  Substance and Sexual Activity  . Alcohol use: Yes    Alcohol/week: 1.0 standard drinks    Types: 1 Cans of beer per week    Comment: 03/12/12 "once in a blue moon I'll have glass of wine too"  . Drug use: Not Currently    Types:  Marijuana    Comment: 03/12/12 "last marijuana was overse[place]; 1970's"  . Sexual activity: Yes  Lifestyle  . Physical activity:    Days per week: 4 days    Minutes per session: 50 min  . Stress: Not at all  Relationships  . Social connections:    Talks on phone: More than three times a week    Gets together: More than three times a week    Attends religious service: Not on file    Active member of club or organization: Not on file    Attends meetings of clubs or organizations: Not on file    Relationship status: Divorced  Other Topics Concern  . Not on file  Social History Narrative  . Not on file    Outpatient Encounter Medications as of 12/25/2018  Medication Sig  . amLODipine (NORVASC) 10 MG tablet Take 1 tablet (10 mg total) by mouth daily.  Marland Kitchen aspirin 81 MG tablet Take 81 mg by mouth daily.  Marland Kitchen atorvastatin (LIPITOR) 10 MG tablet TAKE 1 TABLET (10 MG TOTAL) BY MOUTH DAILY AT 6 PM.  . hydrochlorothiazide (HYDRODIURIL) 25 MG tablet TAKE 1 TABLET BY MOUTH EVERY DAY  . ibuprofen (ADVIL,MOTRIN) 200 MG tablet Take 200 mg by mouth every 6 (six) hours as needed for moderate pain.  Marland Kitchen KLOR-CON 10 10 MEQ tablet TAKE 1 TABLET BY MOUTH EVERY DAY  . lisinopril (PRINIVIL,ZESTRIL) 40 MG tablet TAKE 1 TABLET BY MOUTH EVERY DAY  . metoprolol succinate (TOPROL-XL) 100 MG 24 hr tablet TAKE 1 TABLET BY MOUTH EVERY DAY WITH OR IMMEDIATELY AFTER FOOD  . Multiple Vitamin (MULTIVITAMIN) tablet Take 1 tablet by mouth daily.     No facility-administered encounter medications on file as of 12/25/2018.     Activities of Daily Living In your present state of health, do you have any difficulty performing the following activities: 12/25/2018  Hearing? N  Vision? N  Difficulty concentrating or making decisions? N  Walking or climbing stairs? N  Dressing or bathing? N  Doing errands, shopping? N  Preparing Food and eating ? N  Using the Toilet? N  In the past six months, have you accidently leaked urine? N   Do you have problems with loss of bowel control? N  Managing your Medications? N  Managing your Finances? N  Housekeeping or managing your Housekeeping? N  Some recent data might be hidden    Patient Care Team: Biagio Borg, MD as PCP - General Evans Lance, MD as Consulting Physician (Cardiology)   Assessment:   This is a routine wellness examination for [place]. Physical assessment deferred to PCP.  Exercise Activities and Dietary recommendations Current Exercise Habits: Home exercise routine, Type of exercise: strength training/weights;walking, Time (Minutes): 45, Frequency (Times/Week): 4, Weekly Exercise (Minutes/Week): 180, Intensity: Mild, Exercise limited  by: None identified  Diet (meal preparation, eat out, water intake, caffeinated beverages, dairy products, fruits and vegetables): in general, a "healthy" diet        Patient reports poor appetite with nausea and vomiting at times. He has started to supplement with Ensure. Nurse provided Ensure coupons and and samples. Patient states that he does stay hydrated and drinks a lot of water daily.  Reviewed heart healthy diet, encouraged patient to eat low fat and low sodium foods.  Goals    . Patient Stated     Cut down the amount of salt I consume, eat more vegetables, eat mainly bake meat, continue to drink 2-3 bottles of water daily. Enjoy life and family.       Fall Risk Fall Risk  12/25/2018 12/20/2017 12/14/2016 12/09/2015 12/02/2014  Falls in the past year? 0 No No No No  Number falls in past yr: 0 - - - -    Depression Screen PHQ 2/9 Scores 12/25/2018 12/20/2017 12/14/2016 12/09/2015  PHQ - 2 Score 0 0 0 0  PHQ- 9 Score - 0 - -    Cognitive Function MMSE - Mini Mental State Exam 12/20/2017  Orientation to time 5  Orientation to Place 5  Registration 3  Attention/ Calculation 5  Recall 2  Language- name 2 objects 2  Language- repeat 1  Language- follow 3 step command 3  Language- read & follow direction 1   Write a sentence 1  Copy design 1  Total score 29       Ad8 score reviewed for issues:  Issues making decisions: no  Less interest in hobbies / activities: no  Repeats questions, stories (family complaining): no  Trouble using ordinary gadgets (microwave, computer, phone):no  Forgets the month or year: no  Mismanaging finances: no  Remembering appts: no  Daily problems with thinking and/or memory: no Ad8 score is= 0  Immunization History  Administered Date(s) Administered  . Pneumococcal Conjugate-13 11/27/2013  . Pneumococcal Polysaccharide-23 12/14/2016  . Td 08/13/2004  . Tdap 12/09/2015  . Varicella Zoster Immune Globulin 11/14/2010   Screening Tests Health Maintenance  Topic Date Due  . COLONOSCOPY  04/22/2018  . INFLUENZA VACCINE  03/14/2019  . TETANUS/TDAP  12/08/2025  . Hepatitis C Screening  Completed  . PNA vac Low Risk Adult  Completed       Plan:     Patient wants to discuss intermittent poor appetite with nausea and vomiting and colonoscopy with PCP during physical appointment on 12/26/18  Reviewed health maintenance screenings with patient today and relevant education, vaccines, and/or referrals were provided.   Continue to eat heart healthy diet (full of fruits, vegetables, whole grains, lean protein, water--limit salt, fat, and sugar intake) and increase physical activity as tolerated.  Continue doing brain stimulating activities (puzzles, reading, adult coloring books, staying active) to keep memory sharp.   I have personally reviewed and noted the following in the patient's chart:   . Medical and social history . Use of alcohol, tobacco or illicit drugs  . Current medications and supplements . Functional ability and status . Nutritional status . Physical activity . Advanced directives . List of other physicians . Screenings to include cognitive, depression, and falls . Referrals and appointments  In addition, I have reviewed and  discussed with patient certain preventive protocols, quality metrics, and best practice recommendations. A written personalized care plan for preventive services as well as general preventive health recommendations were provided to patient.  Michiel Cowboy, RN  12/25/2018  Medical screening examination/treatment/procedure(s) were performed by non-physician practitioner and as supervising physician I was immediately available for consultation/collaboration. I agree with above. Cathlean Cower, MD

## 2018-12-25 ENCOUNTER — Ambulatory Visit (INDEPENDENT_AMBULATORY_CARE_PROVIDER_SITE_OTHER): Payer: Medicare Other | Admitting: *Deleted

## 2018-12-25 DIAGNOSIS — Z0000 Encounter for general adult medical examination without abnormal findings: Secondary | ICD-10-CM

## 2018-12-25 NOTE — Patient Instructions (Signed)
Continue doing brain stimulating activities (puzzles, reading, adult coloring books, staying active) to keep memory sharp.   Continue to eat heart healthy diet (full of fruits, vegetables, whole grains, lean protein, water--limit salt, fat, and sugar intake) and increase physical activity as tolerated.   Marcus Boyd , Thank you for taking time to come for your Medicare Wellness Visit. I appreciate your ongoing commitment to your health goals. Please review the following plan we discussed and let me know if I can assist you in the future.   These are the goals we discussed: Goals    . Patient Stated     Cut down the amount of salt I consume, eat more vegetables, eat mainly bake meat, continue to drink 2-3 bottles of water daily. Enjoy life and family.       This is a list of the screening recommended for you and due dates:  Health Maintenance  Topic Date Due  . Colon Cancer Screening  04/22/2018  . Flu Shot  03/14/2019  . Tetanus Vaccine  12/08/2025  .  Hepatitis C: One time screening is recommended by Center for Disease Control  (CDC) for  adults born from [birth year] through 1964.   Completed  . Pneumonia vaccines  Completed    Preventive Care 52 Years and Older, Male Preventive care refers to lifestyle choices and visits with your health care provider that can promote health and wellness. What does preventive care include?   A yearly physical exam. This is also called an annual well check.  Dental exams once or twice a year.  Routine eye exams. Ask your health care provider how often you should have your eyes checked.  Personal lifestyle choices, including: ? Daily care of your teeth and gums. ? Regular physical activity. ? Eating a healthy diet. ? Avoiding tobacco and drug use. ? Limiting alcohol use. ? Practicing safe sex. ? Taking low doses of aspirin every day. ? Taking vitamin and mineral supplements as recommended by your health care provider. What happens during an  annual well check? The services and screenings done by your health care provider during your annual well check will depend on your age, overall health, lifestyle risk factors, and family history of disease. Counseling Your health care provider may ask you questions about your:  Alcohol use.  Tobacco use.  Drug use.  Emotional well-being.  Home and relationship well-being.  Sexual activity.  Eating habits.  History of falls.  Memory and ability to understand (cognition).  Work and work Statistician. Screening You may have the following tests or measurements:  Height, weight, and BMI.  Blood pressure.  Lipid and cholesterol levels. These may be checked every 5 years, or more frequently if you are over 48 years old.  Skin check.  Lung cancer screening. You may have this screening every year starting at age 72 if you have a 30-pack-year history of smoking and currently smoke or have quit within the past 15 years.  Colorectal cancer screening. All adults should have this screening starting at age 62 and continuing until age 26. You will have tests every 1-10 years, depending on your results and the type of screening test. People at increased risk should start screening at an earlier age. Screening tests may include: ? Guaiac-based fecal occult blood testing. ? Fecal immunochemical test (FIT). ? Stool DNA test. ? Virtual colonoscopy. ? Sigmoidoscopy. During this test, a flexible tube with a tiny camera (sigmoidoscope) is used to examine your rectum and lower colon. The  sigmoidoscope is inserted through your anus into your rectum and lower colon. ? Colonoscopy. During this test, a long, thin, flexible tube with a tiny camera (colonoscope) is used to examine your entire colon and rectum.  Prostate cancer screening. Recommendations will vary depending on your family history and other risks.  Hepatitis C blood test.  Hepatitis B blood test.  Sexually transmitted disease (STD)  testing.  Diabetes screening. This is done by checking your blood sugar (glucose) after you have not eaten for a while (fasting). You may have this done every 1-3 years.  Abdominal aortic aneurysm (AAA) screening. You may need this if you are a current or former smoker.  Osteoporosis. You may be screened starting at age 67 if you are at high risk. Talk with your health care provider about your test results, treatment options, and if necessary, the need for more tests. Vaccines Your health care provider may recommend certain vaccines, such as:  Influenza vaccine. This is recommended every year.  Tetanus, diphtheria, and acellular pertussis (Tdap, Td) vaccine. You may need a Td booster every 10 years.  Varicella vaccine. You may need this if you have not been vaccinated.  Zoster vaccine. You may need this after age 47.  Measles, mumps, and rubella (MMR) vaccine. You may need at least one dose of MMR if you were born in 1957 or later. You may also need a second dose.  Pneumococcal 13-valent conjugate (PCV13) vaccine. One dose is recommended after age 59.  Pneumococcal polysaccharide (PPSV23) vaccine. One dose is recommended after age 57.  Meningococcal vaccine. You may need this if you have certain conditions.  Hepatitis A vaccine. You may need this if you have certain conditions or if you travel or work in places where you may be exposed to hepatitis A.  Hepatitis B vaccine. You may need this if you have certain conditions or if you travel or work in places where you may be exposed to hepatitis B.  Haemophilus influenzae type b (Hib) vaccine. You may need this if you have certain risk factors. Talk to your health care provider about which screenings and vaccines you need and how often you need them. This information is not intended to replace advice given to you by your health care provider. Make sure you discuss any questions you have with your health care provider. Document  Released: 08/26/2015 Document Revised: 09/19/2017 Document Reviewed: 05/31/2015 Elsevier Interactive Patient Education  2019 [employer].

## 2018-12-26 ENCOUNTER — Ambulatory Visit (INDEPENDENT_AMBULATORY_CARE_PROVIDER_SITE_OTHER): Payer: Medicare Other | Admitting: Internal Medicine

## 2018-12-26 ENCOUNTER — Other Ambulatory Visit (INDEPENDENT_AMBULATORY_CARE_PROVIDER_SITE_OTHER): Payer: Medicare Other

## 2018-12-26 ENCOUNTER — Other Ambulatory Visit: Payer: Self-pay

## 2018-12-26 ENCOUNTER — Encounter: Payer: Self-pay | Admitting: Internal Medicine

## 2018-12-26 ENCOUNTER — Ambulatory Visit: Payer: Medicare Other

## 2018-12-26 VITALS — BP 120/78 | HR 100 | Temp 98.0°F | Ht 72.0 in | Wt 213.0 lb

## 2018-12-26 DIAGNOSIS — E538 Deficiency of other specified B group vitamins: Secondary | ICD-10-CM

## 2018-12-26 DIAGNOSIS — Z0000 Encounter for general adult medical examination without abnormal findings: Secondary | ICD-10-CM

## 2018-12-26 DIAGNOSIS — R634 Abnormal weight loss: Secondary | ICD-10-CM | POA: Diagnosis not present

## 2018-12-26 DIAGNOSIS — I10 Essential (primary) hypertension: Secondary | ICD-10-CM | POA: Diagnosis not present

## 2018-12-26 DIAGNOSIS — E611 Iron deficiency: Secondary | ICD-10-CM

## 2018-12-26 DIAGNOSIS — R739 Hyperglycemia, unspecified: Secondary | ICD-10-CM | POA: Diagnosis not present

## 2018-12-26 DIAGNOSIS — Z0001 Encounter for general adult medical examination with abnormal findings: Secondary | ICD-10-CM | POA: Diagnosis not present

## 2018-12-26 DIAGNOSIS — E785 Hyperlipidemia, unspecified: Secondary | ICD-10-CM

## 2018-12-26 DIAGNOSIS — E559 Vitamin D deficiency, unspecified: Secondary | ICD-10-CM | POA: Diagnosis not present

## 2018-12-26 DIAGNOSIS — R972 Elevated prostate specific antigen [PSA]: Secondary | ICD-10-CM

## 2018-12-26 LAB — HEPATIC FUNCTION PANEL
ALT: 19 U/L (ref 0–53)
AST: 19 U/L (ref 0–37)
Albumin: 3.3 g/dL — ABNORMAL LOW (ref 3.5–5.2)
Alkaline Phosphatase: 78 U/L (ref 39–117)
Bilirubin, Direct: 0.1 mg/dL (ref 0.0–0.3)
Total Bilirubin: 0.3 mg/dL (ref 0.2–1.2)
Total Protein: 7.7 g/dL (ref 6.0–8.3)

## 2018-12-26 LAB — LIPID PANEL
Cholesterol: 114 mg/dL (ref 0–200)
HDL: 28.7 mg/dL — ABNORMAL LOW (ref >39.00)
LDL Cholesterol: 62 mg/dL (ref 0–99)
NonHDL: 84.97
Total CHOL/HDL Ratio: 4
Triglycerides: 115 mg/dL (ref 0.0–149.0)
VLDL: 23 mg/dL (ref 0.0–40.0)

## 2018-12-26 LAB — URINALYSIS, ROUTINE W REFLEX MICROSCOPIC
Bilirubin Urine: NEGATIVE
Hgb urine dipstick: NEGATIVE
Ketones, ur: NEGATIVE
Leukocytes,Ua: NEGATIVE
Nitrite: NEGATIVE
RBC / HPF: NONE SEEN (ref 0–?)
Specific Gravity, Urine: 1.025 (ref 1.000–1.030)
Total Protein, Urine: NEGATIVE
Urine Glucose: NEGATIVE
Urobilinogen, UA: 0.2 (ref 0.0–1.0)
pH: 6 (ref 5.0–8.0)

## 2018-12-26 LAB — CBC WITH DIFFERENTIAL/PLATELET
Basophils Absolute: 0 10*3/uL (ref 0.0–0.1)
Basophils Relative: 0.3 % (ref 0.0–3.0)
Eosinophils Absolute: 0.2 10*3/uL (ref 0.0–0.7)
Eosinophils Relative: 1.4 % (ref 0.0–5.0)
HCT: 32.9 % — ABNORMAL LOW (ref 39.0–52.0)
Hemoglobin: 10.9 g/dL — ABNORMAL LOW (ref 13.0–17.0)
Lymphocytes Relative: 14.9 % (ref 12.0–46.0)
Lymphs Abs: 1.7 10*3/uL (ref 0.7–4.0)
MCHC: 33.1 g/dL (ref 30.0–36.0)
MCV: 78.2 fl (ref 78.0–100.0)
Monocytes Absolute: 1 10*3/uL (ref 0.1–1.0)
Monocytes Relative: 8.5 % (ref 3.0–12.0)
Neutro Abs: 8.7 10*3/uL — ABNORMAL HIGH (ref 1.4–7.7)
Neutrophils Relative %: 74.9 % (ref 43.0–77.0)
Platelets: 437 10*3/uL — ABNORMAL HIGH (ref 150.0–400.0)
RBC: 4.2 Mil/uL — ABNORMAL LOW (ref 4.22–5.81)
RDW: 15.2 % (ref 11.5–15.5)
WBC: 11.5 10*3/uL — ABNORMAL HIGH (ref 4.0–10.5)

## 2018-12-26 LAB — BASIC METABOLIC PANEL
BUN: 12 mg/dL (ref 6–23)
CO2: 31 mEq/L (ref 19–32)
Calcium: 8.8 mg/dL (ref 8.4–10.5)
Chloride: 101 mEq/L (ref 96–112)
Creatinine, Ser: 1.05 mg/dL (ref 0.40–1.50)
GFR: 84.13 mL/min (ref >60.00)
Glucose, Bld: 101 mg/dL — ABNORMAL HIGH (ref 70–99)
Potassium: 3 mEq/L — ABNORMAL LOW (ref 3.5–5.1)
Sodium: 143 mEq/L (ref 135–145)

## 2018-12-26 LAB — HEMOGLOBIN A1C: Hgb A1c MFr Bld: 7 % — ABNORMAL HIGH (ref 4.6–6.5)

## 2018-12-26 LAB — IBC PANEL
Iron: 15 ug/dL — ABNORMAL LOW (ref 42–165)
Saturation Ratios: 6.3 % — ABNORMAL LOW (ref 20.0–50.0)
Transferrin: 169 mg/dL — ABNORMAL LOW (ref 212.0–360.0)

## 2018-12-26 LAB — PSA: PSA: 5.04 ng/mL — ABNORMAL HIGH (ref 0.10–4.00)

## 2018-12-26 LAB — SEDIMENTATION RATE: Sed Rate: 114 mm/hr — ABNORMAL HIGH (ref 0–20)

## 2018-12-26 LAB — VITAMIN D 25 HYDROXY (VIT D DEFICIENCY, FRACTURES): VITD: >120 ng/mL

## 2018-12-26 LAB — VITAMIN B12: Vitamin B-12: >1500 pg/mL — ABNORMAL HIGH (ref 211–911)

## 2018-12-26 LAB — TSH: TSH: 0.9 u[IU]/mL (ref 0.35–4.50)

## 2018-12-26 NOTE — Progress Notes (Signed)
Subjective:    Patient ID: Marcus Boyd, male    DOB: 08-26-1945, 73 y.o.   MRN: [id number]  HPI  Here for wellness and f/u;  Overall doing ok;  Pt denies Chest pain, worsening SOB, DOE, wheezing, orthopnea, PND, worsening LE edema, palpitations, dizziness or syncope.  Pt denies neurological change such as new headache, facial or extremity weakness.  Pt denies polydipsia, polyuria, or low sugar symptoms. Pt states overall good compliance with treatment and medications, good tolerability, and has been trying to follow appropriate diet.  Pt denies worsening depressive symptoms, suicidal ideation or panic.Pt states good ability with ADL's, has low fall risk, home safety reviewed and adequate, no other significant changes in hearing or vision, and only occasionally active with exercise. Not able to go gym as is closed for panedemic, does some excercises at home.  Wt Readings from Last 3 Encounters:  12/26/18 213 lb (96.6 kg)  09/26/18 223 lb (101.2 kg)  12/20/17 223 lb (101.2 kg)  No fever, night sweats, wt l or other constitutional symptoms, except for variable appetite that is bothersome to him  Denies rash, or specific joint pain.  Denies urinary symptoms such as dysuria, frequency, urgency, flank pain, hematuria or n/v, fever, chills. Past Medical History:  Diagnosis Date  . Atrial fibrillation, new onset ([place]) 11/16/2011  . ERECTILE DYSFUNCTION 04/10/2007  . Exertional dyspnea    "related to my heart problems"  . Flutter-fibrillation ([place])   . Hyperlipemia 11/14/2010  . HYPERTENSION 04/10/2007  . Warfarin anticoagulation 11/16/2011   Past Surgical History:  Procedure Laterality Date  . ATRIAL FLUTTER ABLATION N/A 03/12/2012   Procedure: ATRIAL FLUTTER ABLATION;  Surgeon: Evans Lance, MD;  Location: [facility] CATH LAB;  Service: Cardiovascular;  Laterality: N/A;  . RADIOFREQUENCY ABLATION  03/12/12    reports that he quit smoking about 30 years ago. His smoking use included cigarettes. He has a  20.00 pack-year smoking history. He has never used smokeless tobacco. He reports current alcohol use of about 1.0 standard drinks of alcohol per week. He reports previous drug use. Drug: Marijuana. family history includes Cancer in his father; Hypertension in his mother and sister. No Known Allergies Current Outpatient Medications on File Prior to Visit  Medication Sig Dispense Refill  . amLODipine (NORVASC) 10 MG tablet Take 1 tablet (10 mg total) by mouth daily. 90 tablet 3  . aspirin 81 MG tablet Take 81 mg by mouth daily.    Marland Kitchen atorvastatin (LIPITOR) 10 MG tablet TAKE 1 TABLET (10 MG TOTAL) BY MOUTH DAILY AT 6 PM. 90 tablet 0  . hydrochlorothiazide (HYDRODIURIL) 25 MG tablet TAKE 1 TABLET BY MOUTH EVERY DAY 90 tablet 3  . ibuprofen (ADVIL,MOTRIN) 200 MG tablet Take 200 mg by mouth every 6 (six) hours as needed for moderate pain.    Marland Kitchen lisinopril (PRINIVIL,ZESTRIL) 40 MG tablet TAKE 1 TABLET BY MOUTH EVERY DAY 90 tablet 3  . metoprolol succinate (TOPROL-XL) 100 MG 24 hr tablet TAKE 1 TABLET BY MOUTH EVERY DAY WITH OR IMMEDIATELY AFTER FOOD 90 tablet 3  . Multiple Vitamin (MULTIVITAMIN) tablet Take 1 tablet by mouth daily.       No current facility-administered medications on file prior to visit.    .Review of Systems Constitutional: Negative for other unusual diaphoresis, sweats, appetite or weight changes HENT: Negative for other worsening hearing loss, ear pain, facial swelling, mouth sores or neck stiffness.   Eyes: Negative for other worsening pain, redness or other visual disturbance.  Respiratory:  Negative for other stridor or swelling Cardiovascular: Negative for other palpitations or other chest pain  Gastrointestinal: Negative for worsening diarrhea or loose stools, blood in stool, distention or other pain Genitourinary: Negative for hematuria, flank pain or other change in urine volume.  Musculoskeletal: Negative for myalgias or other joint swelling.  Skin: Negative for other  color change, or other wound or worsening drainage.  Neurological: Negative for other syncope or numbness. Hematological: Negative for other adenopathy or swelling Psychiatric/Behavioral: Negative for hallucinations, other worsening agitation, SI, self-injury, or new decreased concentration All other system neg per pt    Objective:   Physical Exam BP 120/78   Pulse 100   Temp 98 F (36.7 C) (Oral)   Ht 6' (1.829 m)   Wt 213 lb (96.6 kg)   SpO2 96%   BMI 28.89 kg/m \ VS noted,  Constitutional: Pt is oriented to person, place, and time. Appears well-developed and well-nourished, in no significant distress and comfortable Head: Normocephalic and atraumatic  Eyes: Conjunctivae and EOM are normal. Pupils are equal, round, and reactive to light Right Ear: External ear normal without discharge Left Ear: External ear normal without discharge Nose: Nose without discharge or deformity Mouth/Throat: Oropharynx is without other ulcerations and moist  Neck: Normal range of motion. Neck supple. No JVD present. No tracheal deviation present or significant neck LA or mass Cardiovascular: Normal rate, regular rhythm, normal heart sounds and intact distal pulses.   Pulmonary/Chest: WOB normal and breath sounds without rales or wheezing  Abdominal: Soft. Bowel sounds are normal. NT. No HSM  Musculoskeletal: Normal range of motion. Exhibits no edema Lymphadenopathy: Has no other cervical adenopathy.  Neurological: Pt is alert and oriented to person, place, and time. Pt has normal reflexes. No cranial nerve deficit. Motor grossly intact, Gait intact Skin: Skin is warm and dry. No rash noted or new ulcerations Psychiatric:  Has normal mood and affect. Behavior is normal without agitation No other exam findings Lab Results  Component Value Date   WBC 11.5 (H) 12/26/2018   HGB 10.9 Repeated and verified X2. (L) 12/26/2018   HCT 32.9 (L) 12/26/2018   PLT 437.0 (H) 12/26/2018   GLUCOSE 101 (H)  12/26/2018   CHOL 114 12/26/2018   TRIG 115.0 12/26/2018   HDL 28.70 (L) 12/26/2018   LDLDIRECT 85.0 12/20/2017   LDLCALC 62 12/26/2018   ALT 19 12/26/2018   AST 19 12/26/2018   NA 143 12/26/2018   K 3.0 (L) 12/26/2018   CL 101 12/26/2018   CREATININE 1.05 12/26/2018   BUN 12 12/26/2018   CO2 31 12/26/2018   TSH 0.90 12/26/2018   PSA 5.04 (H) 12/26/2018   INR 1.6 (H) 04/04/2012   HGBA1C 7.0 (H) 12/26/2018      Assessment & Plan:

## 2018-12-26 NOTE — Patient Instructions (Signed)
Please continue all other medications as before, and refills have been done if requested.  Please have the pharmacy call with any other refills you may need.  Please continue your efforts at being more active, low cholesterol diet, and weight control.  You are otherwise up to date with prevention measures today.  Please keep your appointments with your specialists as you may have planned  Please go to the XRAY Department in the Basement (go straight as you get off the elevator) for the x-ray testing  Please go to the LAB in the Basement (turn left off the elevator) for the tests to be done today  You will be contacted by phone if any changes need to be made immediately.  Otherwise, you will receive a letter about your results with an explanation, but please check with MyChart first.  Please remember to sign up for MyChart if you have not done so, as this will be important to you in the future with finding out test results, communicating by private email, and scheduling acute appointments online when needed.  Please return in 6 months, or sooner if needed

## 2018-12-27 ENCOUNTER — Encounter: Payer: Self-pay | Admitting: Internal Medicine

## 2018-12-27 ENCOUNTER — Other Ambulatory Visit: Payer: Self-pay | Admitting: Internal Medicine

## 2018-12-27 DIAGNOSIS — D649 Anemia, unspecified: Secondary | ICD-10-CM

## 2018-12-27 DIAGNOSIS — R634 Abnormal weight loss: Secondary | ICD-10-CM

## 2018-12-27 DIAGNOSIS — R700 Elevated erythrocyte sedimentation rate: Secondary | ICD-10-CM

## 2018-12-27 DIAGNOSIS — E876 Hypokalemia: Secondary | ICD-10-CM | POA: Insufficient documentation

## 2018-12-27 MED ORDER — DOXYCYCLINE HYCLATE 100 MG PO TABS: 100.0000 mg | ORAL_TABLET | Freq: Two times a day (BID) | ORAL | 0 refills | Status: DC

## 2018-12-27 MED ORDER — POTASSIUM CHLORIDE ER 10 MEQ PO TBCR: 20.0000 meq | EXTENDED_RELEASE_TABLET | Freq: Every day | ORAL | 3 refills | Status: DC

## 2018-12-27 NOTE — Assessment & Plan Note (Signed)
stable overall by history and exam, recent data reviewed with pt, and pt to continue medical treatment as before,  to f/u any worsening symptoms or concerns  

## 2018-12-27 NOTE — Assessment & Plan Note (Addendum)
Somewhat suspicious, for labs as ordered, for cxr, but consider CT chest/abd/pelvis   In addition to the time spent performing CPE, I spent an additional 25 minutes face to face,in which greater than 50% of this time was spent in counseling and coordination of care for patient's acute illness as documented, including the differential dx, treatment, further evaluation and other management of recent wt loss, HLD, hyperglycemia, HTN, elev PSA

## 2018-12-27 NOTE — Assessment & Plan Note (Signed)
Variable recent, for f/u psa, asympt

## 2018-12-27 NOTE — Assessment & Plan Note (Signed)

## 2018-12-29 ENCOUNTER — Telehealth: Payer: Self-pay | Admitting: Internal Medicine

## 2018-12-29 NOTE — Telephone Encounter (Signed)
Pt has been cld and scheduled to see Dr. Walden Field on 6/4 at 850am. Aware to arrive 20 minutes early.

## 2018-12-30 ENCOUNTER — Telehealth: Payer: Self-pay

## 2018-12-30 NOTE — Telephone Encounter (Signed)
-----   Message from Biagio Borg, MD sent at 12/27/2018 11:33 AM EDT ----- Letter sent, cont same tx except  The test results show that your current treatment is OK, except there are several issues: 1) mild low potassium 2) mild elevated PSA 3) new moderate Anemia 4) markedly elevated Sedimentation Rate (Sed rate)  It also appears you may have not been able to have the chest xray done at the office as we discussed, for some reason, for the weight loss   At this time we need to: 1) increase your potassium to 2 pills per day (this is most likely low due to the fluid pill you take) 2) take and antibiotic for 3 weeks, then check the PSA again, as this may be due to prostatitis 3) CT scan for the Chest, Abd and Pelvis to look for cancer 4) Referral to Hematology/oncology for the anemia 5) If that referral is not conclusive, we may need to refer you to Rheumatology as well  We should also plan to see you back in the office in 1 month  Shirron to please inform pt, I will do the rx x 2, order the CT scans, and the referral;  Please also assist with pt to make ROV 1 mo appt

## 2018-12-30 NOTE — Telephone Encounter (Signed)
Pt has been informed of results and expressed understanding.  °

## 2018-12-31 ENCOUNTER — Other Ambulatory Visit: Payer: Self-pay | Admitting: Internal Medicine

## 2019-01-06 ENCOUNTER — Telehealth: Payer: Self-pay | Admitting: *Deleted

## 2019-01-06 NOTE — Telephone Encounter (Signed)
Script Screening patients for COVID-19 and reviewing new operational procedures  Greeting - The reason I am calling is to share with you some new changes to our processes that are designed to help [place] keep everyone safe. Is now a good time to speak with you? Patient says "no' - ask them when you can call back and let them know it's important to do this prior to their appointment.  Patient says "yes" - [place], [place] the first thing I need to do is ask you some screening Questions.  1. To the best of your knowledge, have you been in close contact with any one with a confirmed diagnosis of COVID 19? o No - proceed to next question  2. Have you had any one or more of the following: fever, chills, cough, shortness of breath or any flu-like symptoms? o No - proceed to next question  3. Have you been diagnosed with or have a previous diagnosis of COVID 19? o No - proceed to next question  4. I am going to go over a few other symptoms with you. Please let me know if you are experiencing any of the following: . Ear, nose or throat discomfort . A sore throat . Headache . Muscle pain . Diarrhea . Loss of taste or smell o No - proceed to next question   Thank you for answering these questions. Please know we will ask you these questions or similar questions when you arrive for your appointment and again it's how we are keeping everyone safe. Also, to keep you safe, please use the provided hand sanitizer when you enter the building. Wendy Poet), we are asking everyone in the building to wear a mask because they help [place] prevent the spread of germs. Do you have a mask of your own, if not, we are happy to provide one for you. The last thing I want to go over with you is the no visitor guidelines. This means no one can attend the appointment with you unless you need physical assistance. I understand this may be different from your past appointments and I know this may be difficult  but please know if someone is driving you we are happy to call them for you once your appointment is over.  [INSERT SITE SPECIFIC CHECK IN [place]  [facility]) I've given you a lot of information, what questions do you have about what I've talked about today or your appointment tomorrow?

## 2019-01-07 ENCOUNTER — Other Ambulatory Visit: Payer: Self-pay

## 2019-01-07 ENCOUNTER — Ambulatory Visit (INDEPENDENT_AMBULATORY_CARE_PROVIDER_SITE_OTHER)
Admission: RE | Admit: 2019-01-07 | Discharge: 2019-01-07 | Disposition: A | Discharge status: Home / Self Care | Payer: Medicare Other | Source: Ambulatory Visit | Attending: Internal Medicine | Admitting: Internal Medicine

## 2019-01-07 ENCOUNTER — Encounter (INDEPENDENT_AMBULATORY_CARE_PROVIDER_SITE_OTHER): Payer: Self-pay

## 2019-01-07 DIAGNOSIS — K7689 Other specified diseases of liver: Secondary | ICD-10-CM | POA: Diagnosis not present

## 2019-01-07 DIAGNOSIS — R634 Abnormal weight loss: Secondary | ICD-10-CM

## 2019-01-07 DIAGNOSIS — J9811 Atelectasis: Secondary | ICD-10-CM | POA: Diagnosis not present

## 2019-01-07 MED ORDER — IOHEXOL 300 MG/ML  SOLN
100.0000 mL | Freq: Once | INTRAMUSCULAR | Status: AC | PRN
  Administered 2019-01-07: 100 mL via INTRAVENOUS

## 2019-01-08 ENCOUNTER — Telehealth: Payer: Self-pay

## 2019-01-08 ENCOUNTER — Encounter: Payer: Self-pay | Admitting: Internal Medicine

## 2019-01-08 ENCOUNTER — Other Ambulatory Visit: Payer: Self-pay | Admitting: Internal Medicine

## 2019-01-08 DIAGNOSIS — R935 Abnormal findings on diagnostic imaging of other abdominal regions, including retroperitoneum: Secondary | ICD-10-CM

## 2019-01-08 DIAGNOSIS — R1011 Right upper quadrant pain: Secondary | ICD-10-CM

## 2019-01-08 DIAGNOSIS — R634 Abnormal weight loss: Secondary | ICD-10-CM

## 2019-01-08 NOTE — Telephone Encounter (Signed)
Pt has been informed of results and expressed understanding.  °

## 2019-01-08 NOTE — Telephone Encounter (Signed)
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  This is highly likely to cause his symptoms.  We need to refer to General Surgury asap.  I will do this, and you should hear from the office as well.  The Chest CT part was negative.  Caleigha Zale to please inform pt, I will do referral

## 2019-01-09 ENCOUNTER — Other Ambulatory Visit: Payer: Self-pay | Admitting: Internal Medicine

## 2019-01-11 ENCOUNTER — Other Ambulatory Visit: Payer: Self-pay | Admitting: Internal Medicine

## 2019-01-15 ENCOUNTER — Inpatient Hospital Stay: Payer: MEDICARE

## 2019-01-15 ENCOUNTER — Inpatient Hospital Stay: Payer: MEDICARE | Attending: Internal Medicine | Admitting: Internal Medicine

## 2019-01-15 DIAGNOSIS — C23 Malignant neoplasm of gallbladder: Secondary | ICD-10-CM | POA: Diagnosis not present

## 2019-01-15 DIAGNOSIS — D649 Anemia, unspecified: Secondary | ICD-10-CM | POA: Diagnosis not present

## 2019-01-21 ENCOUNTER — Other Ambulatory Visit: Payer: Self-pay | Admitting: Surgery

## 2019-01-21 DIAGNOSIS — C23 Malignant neoplasm of gallbladder: Secondary | ICD-10-CM

## 2019-01-27 ENCOUNTER — Encounter: Payer: Self-pay | Admitting: Internal Medicine

## 2019-01-30 ENCOUNTER — Encounter: Payer: Self-pay | Admitting: Internal Medicine

## 2019-01-30 ENCOUNTER — Other Ambulatory Visit (INDEPENDENT_AMBULATORY_CARE_PROVIDER_SITE_OTHER): Payer: Medicare Other

## 2019-01-30 ENCOUNTER — Ambulatory Visit (INDEPENDENT_AMBULATORY_CARE_PROVIDER_SITE_OTHER): Payer: Medicare Other | Admitting: Internal Medicine

## 2019-01-30 ENCOUNTER — Other Ambulatory Visit: Payer: Self-pay

## 2019-01-30 VITALS — BP 130/78 | HR 87 | Temp 98.4°F | Ht 72.0 in | Wt 211.0 lb

## 2019-01-30 DIAGNOSIS — R700 Elevated erythrocyte sedimentation rate: Secondary | ICD-10-CM

## 2019-01-30 DIAGNOSIS — R972 Elevated prostate specific antigen [PSA]: Secondary | ICD-10-CM

## 2019-01-30 DIAGNOSIS — R634 Abnormal weight loss: Secondary | ICD-10-CM

## 2019-01-30 DIAGNOSIS — D649 Anemia, unspecified: Secondary | ICD-10-CM

## 2019-01-30 DIAGNOSIS — E876 Hypokalemia: Secondary | ICD-10-CM

## 2019-01-30 LAB — CBC WITH DIFFERENTIAL/PLATELET
Basophils Absolute: 0 10*3/uL (ref 0.0–0.1)
Basophils Relative: 0.4 % (ref 0.0–3.0)
Eosinophils Absolute: 0.2 10*3/uL (ref 0.0–0.7)
Eosinophils Relative: 1.8 % (ref 0.0–5.0)
HCT: 33.1 % — ABNORMAL LOW (ref 39.0–52.0)
Hemoglobin: 10.8 g/dL — ABNORMAL LOW (ref 13.0–17.0)
Lymphocytes Relative: 27.2 % (ref 12.0–46.0)
Lymphs Abs: 2.4 10*3/uL (ref 0.7–4.0)
MCHC: 32.7 g/dL (ref 30.0–36.0)
MCV: 78 fl (ref 78.0–100.0)
Monocytes Absolute: 0.8 10*3/uL (ref 0.1–1.0)
Monocytes Relative: 9.1 % (ref 3.0–12.0)
Neutro Abs: 5.4 10*3/uL (ref 1.4–7.7)
Neutrophils Relative %: 61.5 % (ref 43.0–77.0)
Platelets: 386 10*3/uL (ref 150.0–400.0)
RBC: 4.24 Mil/uL (ref 4.22–5.81)
RDW: 18.4 % — ABNORMAL HIGH (ref 11.5–15.5)
WBC: 8.8 10*3/uL (ref 4.0–10.5)

## 2019-01-30 LAB — BASIC METABOLIC PANEL
BUN: 12 mg/dL (ref 6–23)
CO2: 33 mEq/L — ABNORMAL HIGH (ref 19–32)
Calcium: 9.2 mg/dL (ref 8.4–10.5)
Chloride: 102 mEq/L (ref 96–112)
Creatinine, Ser: 1.04 mg/dL (ref 0.40–1.50)
GFR: 85.04 mL/min (ref >60.00)
Glucose, Bld: 97 mg/dL (ref 70–99)
Potassium: 3.5 mEq/L (ref 3.5–5.1)
Sodium: 142 mEq/L (ref 135–145)

## 2019-01-30 LAB — HEPATIC FUNCTION PANEL
ALT: 11 U/L (ref 0–53)
AST: 15 U/L (ref 0–37)
Albumin: 3.5 g/dL (ref 3.5–5.2)
Alkaline Phosphatase: 64 U/L (ref 39–117)
Bilirubin, Direct: 0.1 mg/dL (ref 0.0–0.3)
Total Bilirubin: 0.3 mg/dL (ref 0.2–1.2)
Total Protein: 7.1 g/dL (ref 6.0–8.3)

## 2019-01-30 LAB — SEDIMENTATION RATE: Sed Rate: 95 mm/hr — ABNORMAL HIGH (ref 0–20)

## 2019-01-30 LAB — PSA: PSA: 4.08 ng/mL — ABNORMAL HIGH (ref 0.10–4.00)

## 2019-01-30 NOTE — Assessment & Plan Note (Addendum)
Appetite now improved, has gained several lbs - ? Due to successful prostatitis tx vs other

## 2019-01-30 NOTE — Patient Instructions (Addendum)

## 2019-01-30 NOTE — Progress Notes (Signed)
Subjective:    Patient ID: Marcus Boyd, male    DOB: 1945/09/06, 73 y.o.   MRN: [id number]  HPI  Here to f/u overall looking and feeling remarkably well, seems upbeat, has no complaints.  At last visit 1 mo ago he was found to have mild low K, mild elevated PSA, new anemia, severe elevated ESR and abnormal Abd CT with ? GB mass like abnormality.  Has seen general surgury with plan for MRI to f/u.  Pt denies chest pain, increased sob or doe, wheezing, orthopnea, PND, increased LE swelling, palpitations, dizziness or syncope.  Pt denies new neurological symptoms such as new headache, or facial or extremity weakness or numbness   Pt denies polydipsia, polyuria, or low sugar symptoms such as weakness or confusion improved with po intake.  Pt states overall good compliance with meds, trying to follow lower cholesterol, diabetic diet, wt overall stable but little exercise however.     Denies worsening reflux, abd pain, dysphagia, n/v, bowel change or blood.  Denies urinary symptoms such as dysuria, frequency, urgency, flank pain, hematuria or n/v, fever, chills. Past Medical History:  Diagnosis Date  . Atrial fibrillation, new onset ([place]) 11/16/2011  . ERECTILE DYSFUNCTION 04/10/2007  . Exertional dyspnea    "related to my heart problems"  . Flutter-fibrillation ([place])   . Hyperlipemia 11/14/2010  . HYPERTENSION 04/10/2007  . Warfarin anticoagulation 11/16/2011   Past Surgical History:  Procedure Laterality Date  . ATRIAL FLUTTER ABLATION N/A 03/12/2012   Procedure: ATRIAL FLUTTER ABLATION;  Surgeon: Evans Lance, MD;  Location: [facility] CATH LAB;  Service: Cardiovascular;  Laterality: N/A;  . RADIOFREQUENCY ABLATION  03/12/12    reports that he quit smoking about 30 years ago. His smoking use included cigarettes. He has a 20.00 pack-year smoking history. He has never used smokeless tobacco. He reports current alcohol use of about 1.0 standard drinks of alcohol per week. He reports previous drug use. Drug:  Marijuana. family history includes Cancer in his father; Hypertension in his mother and sister. No Known Allergies Current Outpatient Medications on File Prior to Visit  Medication Sig Dispense Refill  . amLODipine (NORVASC) 10 MG tablet Take 1 tablet (10 mg total) by mouth daily. 90 tablet 3  . aspirin 81 MG tablet Take 81 mg by mouth daily.    Marland Kitchen atorvastatin (LIPITOR) 10 MG tablet TAKE 1 TABLET (10 MG TOTAL) BY MOUTH DAILY AT 6 PM. 90 tablet 0  . doxycycline (VIBRA-TABS) 100 MG tablet Take 1 tablet (100 mg total) by mouth 2 (two) times daily. 42 tablet 0  . hydrochlorothiazide (HYDRODIURIL) 25 MG tablet TAKE 1 TABLET BY MOUTH EVERY DAY 90 tablet 1  . ibuprofen (ADVIL,MOTRIN) 200 MG tablet Take 200 mg by mouth every 6 (six) hours as needed for moderate pain.    Marland Kitchen lisinopril (ZESTRIL) 40 MG tablet TAKE 1 TABLET BY MOUTH EVERY DAY 90 tablet 3  . metoprolol succinate (TOPROL-XL) 100 MG 24 hr tablet TAKE 1 TABLET BY MOUTH EVERY DAY WITH OR IMMEDIATELY AFTER FOOD 90 tablet 3  . Multiple Vitamin (MULTIVITAMIN) tablet Take 1 tablet by mouth daily.      . potassium chloride (KLOR-CON 10) 10 MEQ tablet Take 2 tablets (20 mEq total) by mouth daily. 180 tablet 3   No current facility-administered medications on file prior to visit.    Review of Systems  Constitutional: Negative for other unusual diaphoresis or sweats HENT: Negative for ear discharge or swelling Eyes: Negative for other worsening visual  disturbances Respiratory: Negative for stridor or other swelling  Gastrointestinal: Negative for worsening distension or other blood Genitourinary: Negative for retention or other urinary change Musculoskeletal: Negative for other MSK pain or swelling Skin: Negative for color change or other new lesions Neurological: Negative for worsening tremors and other numbness  Psychiatric/Behavioral: Negative for worsening agitation or other fatigue All other system neg per pt    Objective:   Physical Exam  BP 130/78   Pulse 87   Temp 98.4 F (36.9 C) (Oral)   Ht 6' (1.829 m)   Wt 211 lb (95.7 kg)   SpO2 96%   BMI 28.62 kg/m  VS noted,  Constitutional: Pt appears in NAD HENT: Head: NCAT.  Right Ear: External ear normal.  Left Ear: External ear normal.  Eyes: . Pupils are equal, round, and reactive to light. Conjunctivae and EOM are normal Nose: without d/c or deformity Neck: Neck supple. Gross normal ROM Cardiovascular: Normal rate and regular rhythm.   Pulmonary/Chest: Effort normal and breath sounds without rales or wheezing.  Abd:  Soft, NT, ND, + BS, no organomegaly Neurological: Pt is alert. At baseline orientation, motor grossly intact Skin: Skin is warm. No rashes, other new lesions, no LE edema Psychiatric: Pt behavior is normal without agitation  No other exam findings Lab Results  Component Value Date   WBC 8.8 01/30/2019   HGB 10.8 (L) 01/30/2019   HCT 33.1 (L) 01/30/2019   PLT 386.0 01/30/2019   GLUCOSE 97 01/30/2019   CHOL 114 12/26/2018   TRIG 115.0 12/26/2018   HDL 28.70 (L) 12/26/2018   LDLDIRECT 85.0 12/20/2017   LDLCALC 62 12/26/2018   ALT 11 01/30/2019   AST 15 01/30/2019   NA 142 01/30/2019   K 3.5 01/30/2019   CL 102 01/30/2019   CREATININE 1.04 01/30/2019   BUN 12 01/30/2019   CO2 33 (H) 01/30/2019   TSH 0.90 12/26/2018   PSA 4.08 (H) 01/30/2019   INR 1.6 (H) 04/04/2012   HGBA1C 7.0 (H) 12/26/2018       Assessment & Plan:

## 2019-01-30 NOTE — Assessment & Plan Note (Signed)
Etiology unclear, possible related to abnormal ct findings, for f/u today

## 2019-01-30 NOTE — Assessment & Plan Note (Signed)
S/p doxy tx x 3 wks, for f/u psa today, consider urology referral

## 2019-01-30 NOTE — Assessment & Plan Note (Signed)
Normocytic, b12 and iron normal, for f/u lab today,  to f/u any worsening symptoms or concerns

## 2019-01-30 NOTE — Assessment & Plan Note (Addendum)
Replacement, for f/u lab today  Note:  Total time for pt hx, exam, review of record with pt in the room, determination of diagnoses and plan for further eval and tx is > 40 min, with over 50% spent in coordination and counseling of patient including the differential dx, tx, further evaluation and other management of low K, recent wt loss, elevated sed rate, elevated psa, and anemia

## 2019-02-06 ENCOUNTER — Ambulatory Visit: Payer: MEDICARE | Admitting: *Deleted

## 2019-02-06 ENCOUNTER — Other Ambulatory Visit: Payer: Self-pay

## 2019-02-06 VITALS — Ht 72.0 in | Wt 210.0 lb

## 2019-02-06 DIAGNOSIS — Z86010 Personal history of colonic polyps: Secondary | ICD-10-CM

## 2019-02-06 NOTE — Progress Notes (Signed)
Patient's pre-visit was done today over the phone with the patient due to COVID-19 pandemic. Name,DOB and address verified. Insurance verified. Packet of Prep instructions mailed to patient including copy of a consent form and pre-procedure patient acknowledgement form-pt is aware. Patient understands to call us back with any questions or concerns. Patient denies any allergies to eggs or soy. Patient denies any problems with anesthesia/sedation. Patient denies any oxygen use at home. Patient denies taking any diet/weight loss medications or blood thinners. EMMI education assisgned to patient on colonoscopy, this was explained and instructions given to patient. Pt is aware that care partner will wait in the car during parking lot; if they feel like they will be too hot to wait in the car; they may wait in the lobby.  We want them to wear a mask (we do not have any that we can provide them), practice social distancing, and we will check their temperatures when they get here.  I did remind patient that their care partner needs to stay in the parking lot the entire time. Pt will wear mask into building. I explained in detail our care partner policy. Pt verbalizes understanding and will call us back if he is unable to find someone.

## 2019-02-07 ENCOUNTER — Ambulatory Visit
Admission: RE | Admit: 2019-02-07 | Discharge: 2019-02-07 | Disposition: A | Discharge status: Home / Self Care | Payer: MEDICARE | Source: Ambulatory Visit | Attending: Surgery | Admitting: Surgery

## 2019-02-07 DIAGNOSIS — C23 Malignant neoplasm of gallbladder: Secondary | ICD-10-CM

## 2019-02-07 DIAGNOSIS — K7689 Other specified diseases of liver: Secondary | ICD-10-CM | POA: Diagnosis not present

## 2019-02-07 DIAGNOSIS — K8020 Calculus of gallbladder without cholecystitis without obstruction: Secondary | ICD-10-CM | POA: Diagnosis not present

## 2019-02-07 MED ORDER — GADOBENATE DIMEGLUMINE 529 MG/ML IV SOLN
20.0000 mL | Freq: Once | INTRAVENOUS | Status: AC | PRN
  Administered 2019-02-07: 20 mL via INTRAVENOUS

## 2019-02-11 ENCOUNTER — Telehealth: Payer: Self-pay | Admitting: Oncology

## 2019-02-11 NOTE — Telephone Encounter (Signed)
Received a new patient referral from Dr. Ninfa Linden at [facility] for dx of gallbladder cancer. Pt has been cld and scheduled to see Dr. Benay Spice on 7/9 at 2pm. Pt aware to arrive 20 minutes early.

## 2019-02-19 ENCOUNTER — Inpatient Hospital Stay: Payer: Medicare Other | Attending: Internal Medicine | Admitting: Oncology

## 2019-02-19 ENCOUNTER — Telehealth: Payer: Self-pay | Admitting: Internal Medicine

## 2019-02-19 NOTE — Telephone Encounter (Signed)

## 2019-02-20 ENCOUNTER — Other Ambulatory Visit: Payer: Self-pay

## 2019-02-20 ENCOUNTER — Ambulatory Visit (AMBULATORY_SURGERY_CENTER): Payer: Medicare Other | Admitting: Internal Medicine

## 2019-02-20 ENCOUNTER — Encounter: Payer: Self-pay | Admitting: Internal Medicine

## 2019-02-20 VITALS — BP 159/90 | HR 68 | Temp 98.4°F | Resp 17 | Ht 72.0 in | Wt 210.0 lb

## 2019-02-20 DIAGNOSIS — Z86010 Personal history of colonic polyps: Secondary | ICD-10-CM

## 2019-02-20 DIAGNOSIS — Z860101 Personal history of adenomatous and serrated colon polyps: Secondary | ICD-10-CM

## 2019-02-20 DIAGNOSIS — D123 Benign neoplasm of transverse colon: Secondary | ICD-10-CM

## 2019-02-20 DIAGNOSIS — D124 Benign neoplasm of descending colon: Secondary | ICD-10-CM | POA: Diagnosis not present

## 2019-02-20 HISTORY — DX: Personal history of colonic polyps: Z86.010

## 2019-02-20 HISTORY — DX: Personal history of adenomatous and serrated colon polyps: Z86.0101

## 2019-02-20 MED ORDER — SODIUM CHLORIDE 0.9 % IV SOLN: 500.0000 mL | Freq: Once | INTRAVENOUS | Status: DC

## 2019-02-20 NOTE — Op Note (Addendum)
[facility] Patient Name: Marcus Boyd Procedure Date: 02/20/2019 3:22 PM MRN: [id number] Endoscopist: Gatha Mayer , MD Age: 73 Referring MD:  Date of Birth: 1946/03/13 Gender: Male Account #: [id number] Procedure:                Colonoscopy Indications:              Surveillance: Personal history of adenomatous                            polyps on last colonoscopy > 5 years ago, Last                            colonoscopy: 2009 Medicines:                Propofol per Anesthesia, Monitored Anesthesia Care Procedure:                Pre-Anesthesia Assessment:                           - Prior to the procedure, a History and Physical                            was performed, and patient medications and                            allergies were reviewed. The patient's tolerance of                            previous anesthesia was also reviewed. The risks                            and benefits of the procedure and the sedation                            options and risks were discussed with the patient.                            All questions were answered, and informed consent                            was obtained. Prior Anticoagulants: The patient has                            taken no previous anticoagulant or antiplatelet                            agents. ASA Grade Assessment: II - A patient with                            mild systemic disease. After reviewing the risks                            and benefits, the patient was deemed in  satisfactory condition to undergo the procedure.                           After obtaining informed consent, the colonoscope                            was passed under direct vision. Throughout the                            procedure, the patient's blood pressure, pulse, and                            oxygen saturations were monitored continuously. The                            Colonoscope was  introduced through the anus and                            advanced to the the cecum, identified by                            appendiceal orifice and ileocecal valve. The                            colonoscopy was performed without difficulty. The                            patient tolerated the procedure well. The quality                            of the bowel preparation was good. The ileocecal                            valve, appendiceal orifice, and rectum were                            photographed. The bowel preparation used was                            Miralax via split dose instruction. Scope In: 3:29:22 PM Scope Out: 3:56:57 PM Scope Withdrawal Time: 0 hours 20 minutes 23 seconds  Total Procedure Duration: 0 hours 27 minutes 35 seconds  Findings:                 The perianal and digital rectal examinations were                            normal. Pertinent negatives include normal prostate                            (size, shape, and consistency).                           Six sessile polyps were found in the descending  colon and transverse colon. The polyps were                            diminutive in size. These polyps were removed with                            a cold snare. Resection and retrieval were                            complete. Verification of patient identification                            for the specimen was done. Estimated blood loss was                            minimal. Estimated blood loss was minimal.                           A 20 to 25 mm polyp was found in the mid transverse                            colon. The polyp was sessile. Biopsies were taken                            with a cold forceps for histology. Verification of                            patient identification for the specimen was done.                            Estimated blood loss was minimal. Area was                            unsuccessfully  injected with saline for lesion                            assessment, but the lesion could not be lifted                            adequately. Area was tattooed with an injection of                            Spot (carbon black).                           The exam was otherwise without abnormality on                            direct and retroflexion views. Complications:            No immediate complications. Estimated Blood Loss:     Estimated blood loss was minimal. Impression:               - Six diminutive polyps in the descending  colon and                            in the transverse colon, removed with a cold snare.                            Resected and retrieved.                           - One 20 to 25 mm polyp in the mid transverse                            colon. Biopsied. Treatment not successful. Would                            not lift wit saline.? technique vs true "non-lift                            sign" suggesting carcinoma Tattooed.                           - The examination was otherwise normal on direct                            and retroflexion views. Except rectal and sigmoid                            melanosis                           - Personal history of colonic polyp 1.1 cm TV                            adenoma 2009. Recommendation:           - Patient has a contact number available for                            emergencies. The signs and symptoms of potential                            delayed complications were discussed with the                            patient. Return to normal activities tomorrow.                            Written discharge instructions were provided to the                            patient.                           - Resume previous diet.                           -  Continue present medications.                           - Await pathology results.                           - No recommendation at this time regarding repeat                             colonoscopy. Gatha Mayer, MD 02/20/2019 4:11:16 PM This report has been signed electronically.

## 2019-02-20 NOTE — Progress Notes (Signed)
Pt's states no medical or surgical changes since previsit or office visit. 

## 2019-02-20 NOTE — Progress Notes (Signed)
A/ox3, pleased with MAC, report to RN 

## 2019-02-20 NOTE — Progress Notes (Signed)
Called to room to assist during endoscopic procedure.  Patient ID and intended procedure confirmed with present staff. Received instructions for my participation in the procedure from the performing physician.  

## 2019-02-20 NOTE — Progress Notes (Signed)
Vitals CW Temp Marcus Boyd

## 2019-02-20 NOTE — Patient Instructions (Addendum)
I found and removed 6 tiny polyps. There is a large polyp that I took biopsies from but did not remove. I have some concern that it could be a cancer.   Once I get that information I will let you know the results and plans. If it is cancer you will need surgery to remove it.  I appreciate the opportunity to care for you. Gatha Mayer, MD, [facility]  Handout given for polyps.  YOU HAD AN ENDOSCOPIC PROCEDURE TODAY AT [facility]:   Refer to the procedure report that was given to you for any specific questions about what was found during the examination.  If the procedure report does not answer your questions, please call your gastroenterologist to clarify.  If you requested that your care partner not be given the details of your procedure findings, then the procedure report has been included in a sealed envelope for you to review at your convenience later.  YOU SHOULD EXPECT: Some feelings of bloating in the abdomen. Passage of more gas than usual.  Walking can help get rid of the air that was put into your GI tract during the procedure and reduce the bloating. If you had a lower endoscopy (such as a colonoscopy or flexible sigmoidoscopy) you may notice spotting of blood in your stool or on the toilet paper. If you underwent a bowel prep for your procedure, you may not have a normal bowel movement for a few days.  Please Note:  You might notice some irritation and congestion in your nose or some drainage.  This is from the oxygen used during your procedure.  There is no need for concern and it should clear up in a day or so.  SYMPTOMS TO REPORT IMMEDIATELY:   Following lower endoscopy (colonoscopy or flexible sigmoidoscopy):  Excessive amounts of blood in the stool  Significant tenderness or worsening of abdominal pains  Swelling of the abdomen that is new, acute  Fever of 100F or higher   For urgent or emergent issues, a gastroenterologist can be reached at any hour by  calling [phone].   DIET:  We do recommend a small meal at first, but then you may proceed to your regular diet.  Drink plenty of fluids but you should avoid alcoholic beverages for 24 hours.  ACTIVITY:  You should plan to take it easy for the rest of today and you should NOT DRIVE or use heavy machinery until tomorrow (because of the sedation medicines used during the test).    FOLLOW UP: Our staff will call the number listed on your records 48-72 hours following your procedure to check on you and address any questions or concerns that you may have regarding the information given to you following your procedure. If we do not reach you, we will leave a message.  We will attempt to reach you two times.  During this call, we will ask if you have developed any symptoms of COVID 19. If you develop any symptoms (ie: fever, flu-like symptoms, shortness of breath, cough etc.) before then, please call [phone].  If you test positive for Covid 19 in the 2 weeks post procedure, please call and report this information to [place].    If any biopsies were taken you will be contacted by phone or by letter within the next 1-3 weeks.  Please call us at [phone] if you have not heard about the biopsies in 3 weeks.    SIGNATURES/CONFIDENTIALITY: You and/or your care partner  have signed paperwork which will be entered into your electronic medical record.  These signatures attest to the fact that that the information above on your After Visit Summary has been reviewed and is understood.  Full responsibility of the confidentiality of this discharge information lies with you and/or your care-partner.

## 2019-02-24 ENCOUNTER — Telehealth: Payer: Self-pay

## 2019-02-24 NOTE — Telephone Encounter (Signed)
  Follow up Call-  Call back number 02/20/2019  Post procedure Call Back phone  # [phone]  Permission to leave phone message Yes  Some recent data might be hidden     Patient questions:  Do you have a fever, pain , or abdominal swelling? No. Pain Score  0 *  Have you tolerated food without any problems? Yes.    Have you been able to return to your normal activities? Yes.    Do you have any questions about your discharge instructions: Diet   No. Medications  No. Follow up visit  No.  Do you have questions or concerns about your Care? No.  Actions: * If pain score is 4 or above: No action needed, pain <4. 1. Have you developed a fever since your procedure? no  2.   Have you had an respiratory symptoms (SOB or cough) since your procedure? no  3.   Have you tested positive for COVID 19 since your procedure no  4.   Have you had any family members/close contacts diagnosed with the COVID 19 since your procedure?  no   If yes to any of these questions please route to Joylene John, RN and Alphonsa Gin, Therapist, sports.

## 2019-02-25 ENCOUNTER — Telehealth: Payer: Self-pay | Admitting: Oncology

## 2019-02-25 ENCOUNTER — Other Ambulatory Visit: Payer: Self-pay | Admitting: General Surgery

## 2019-02-25 DIAGNOSIS — C23 Malignant neoplasm of gallbladder: Secondary | ICD-10-CM | POA: Diagnosis not present

## 2019-02-25 NOTE — Telephone Encounter (Signed)
I cld Marcus Boyd to r/s his appt from 7/9 to 7/30 w/Dr. Benay Spice. Per pt's request, he needed a Thursday or Friday appt.

## 2019-03-02 ENCOUNTER — Other Ambulatory Visit: Payer: Self-pay | Admitting: Internal Medicine

## 2019-03-02 NOTE — Progress Notes (Signed)
Please call from office and tell him the big polyp is NOT cancer I will call him later in week to discuss more and what else we need to do No recall or kletter now

## 2019-03-05 ENCOUNTER — Other Ambulatory Visit: Payer: Self-pay | Admitting: Internal Medicine

## 2019-03-10 ENCOUNTER — Telehealth: Payer: Self-pay | Admitting: General Surgery

## 2019-03-10 NOTE — Telephone Encounter (Signed)
-----   Message from Cipriano Mile sent at 03/03/2019  9:17 AM EDT ----- Pt is scheduled for sx on 8/4.    I called patient this morning re: insurance termination and during the conversation, he stated that someone had called him yesterday and told him that he doesn't have cancer and he did not need sx.  In the notes of Epic, a call was made from Dr. Celesta Aver office.  Please advise if his sx needs to be canceled.  Thanks.  bg

## 2019-03-10 NOTE — Telephone Encounter (Signed)
Pt called our office stating that he didn't need surgery because he didn't think he had cancer.  Also, he had paperwork issues and is having issues with his insurance.  I spoke to him on the phone.  He is thinking about surgery and trying to get his insurance straightened out.    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    I will ask our manager/cheduler to call him in a week or so.

## 2019-03-11 ENCOUNTER — Telehealth: Payer: Self-pay

## 2019-03-11 NOTE — Progress Notes (Signed)
Please tell [name] that I am communicating with Dr. Barry Dienes about this - since he is going to the OR on 8/4 (gallbladder cancer suspected) she can possibly check this spot in colon more

## 2019-03-11 NOTE — Telephone Encounter (Signed)
Called patient to review D/T/L of initial consult with Dr. Benay Spice @ 2 PM. Patient aware.

## 2019-03-12 ENCOUNTER — Telehealth: Payer: Self-pay | Admitting: Oncology

## 2019-03-12 ENCOUNTER — Other Ambulatory Visit: Payer: Self-pay

## 2019-03-12 ENCOUNTER — Encounter: Payer: Self-pay | Admitting: Oncology

## 2019-03-12 ENCOUNTER — Inpatient Hospital Stay (HOSPITAL_BASED_OUTPATIENT_CLINIC_OR_DEPARTMENT_OTHER): Payer: Medicare Other | Admitting: Oncology

## 2019-03-12 VITALS — BP 133/72 | HR 77 | Temp 97.8°F | Resp 18 | Ht 72.0 in | Wt 211.8 lb

## 2019-03-12 DIAGNOSIS — R932 Abnormal findings on diagnostic imaging of liver and biliary tract: Secondary | ICD-10-CM | POA: Diagnosis not present

## 2019-03-12 NOTE — Progress Notes (Signed)
[facility] New Patient Consult   Requesting MD: Biagio Borg, Md [address] [place],  [place] [address]   Marcus Boyd 73 y.o.  Jun 05, 1946    Reason for Consult: Possible gallbladder cancer   HPI: Mr. reports having postprandial emesis every few weeks and saw Dr. Jenny Reichmann.  This occurred several months ago.  He was referred for CTs on 01/07/2019.  Hypodense left hepatic lobe lesions are not consistent with simple cyst.  The gallbladder is poorly defined with masslike conglomerate at the gallbladder fossa with multiple stones.  8 mm stone in the distal common bile duct.Marland Kitchen  He was referred for an MRI of the abdomen on 02/07/2019.  The gallbladder is filled with stones.  There is diffuse gallbladder wall thickening and enhancement with ill-defined margins with the adjacent liver.  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  Mild diffuse biliary duct dilatation.  A 6 mm stone is seen within the distal common bile duct 1.7 cm portacaval node.  No other pathologic enlarged lymph nodes.  Small hepatic cyst, no liver metastases.  He was referred to Dr. Barry Dienes and scheduled for surgery.  He underwent a colonoscopy by Dr. Carlean Purl on 02/20/2019.  Multiple polyps were removed from the colon 1 polyp in the mid transverse colon could not be removed and was biopsied.  The area was tattooed.  The resected polyps returned as tubular adenomas.  Transverse polyp is an inflammatory polyp with lymphoid aggregates.  No evidence of malignancy.  Mr. Ramnauth canceled the scheduled surgery.  Past Medical History:  Diagnosis Date  . Atrial fibrillation, new onset ([place]) 11/16/2011  . ERECTILE DYSFUNCTION 04/10/2007  . Exertional dyspnea    "related to my heart problems"  . Flutter-fibrillation ([place])   . Hx of adenomatous colonic polyps 02/20/2019  . Hyperlipemia 11/14/2010  . HYPERTENSION 04/10/2007    .  History of gonorrhea  Past Surgical History:  Procedure Laterality Date  .  ATRIAL FLUTTER ABLATION N/A 03/12/2012   Procedure: ATRIAL FLUTTER ABLATION;  Surgeon: Evans Lance, MD;  Location: [facility] CATH LAB;  Service: Cardiovascular;  Laterality: N/A;  . COLONOSCOPY  04/22/2008  . POLYPECTOMY    . RADIOFREQUENCY ABLATION  03/12/12    Medications: Reviewed  Allergies: No Known Allergies  Family history: His father had lung cancer was a smoker.  No other family history of cancer.  Social History:   He lives alone in [place].  He is a retired Administrator and now works Retail buyer.  He quit smoking cigarettes approximately 15 years ago.  He drinks beer and wine occasionally.  He was in the [employer].  No transfusion history.  No risk factor for HIV or hepatitis.  ROS:   Positives include: Postprandial vomiting several months ago-resolved  A complete ROS was otherwise negative.  Physical Exam:  Blood pressure 133/72, pulse 77, temperature 97.8 F (36.6 C), temperature source Oral, resp. rate 18, height 6' (1.829 m), weight 211 lb 12.8 oz (96.1 kg), SpO2 98 %. Limited physical examination secondary to distancing with the coded pandemic HEENT: Neck without mass Lungs: Decreased breath sounds of the left lower posterior chest, no respiratory distress Cardiac: Regular rate and rhythm Abdomen: No hepatosplenomegaly, no mass, nontender GU: Testes without mass Vascular: No leg edema Lymph nodes: No cervical, supraclavicular, axillary, or inguinal nodes Neurologic: Alert and oriented, motor exam appears intact in the upper and lower extremities bilaterally Skin: No rash Musculoskeletal: No spine tenderness   LAB:  CBC  Lab Results  Component Value Date   WBC 8.8 01/30/2019   HGB 10.8 (L) 01/30/2019   HCT 33.1 (L) 01/30/2019   MCV 78.0 01/30/2019   PLT 386.0 01/30/2019   NEUTROABS 5.4 01/30/2019        CMP  Lab Results  Component Value Date   NA 142 01/30/2019   K 3.5 01/30/2019   CL 102 01/30/2019   CO2 33 (H) 01/30/2019   GLUCOSE 97  01/30/2019   BUN 12 01/30/2019   CREATININE 1.04 01/30/2019   CALCIUM 9.2 01/30/2019   PROT 7.1 01/30/2019   ALBUMIN 3.5 01/30/2019   AST 15 01/30/2019   ALT 11 01/30/2019   ALKPHOS 64 01/30/2019   BILITOT 0.3 01/30/2019   GFRNONAA 78.79 11/28/2009   GFRAA 61 12/05/2007     01/15/2019: CA 19-9: 3, CEA: 0.5  Imaging: As per HPI-01/07/2019 CTs and 02/07/2019 MRI images reviewed with Mr. Billig   Assessment/Plan:   1. Masslike thickening of the gallbladder noted on CT and MRI  CTs 01/08/2019- ill-defined appearance of the gallbladder with multiple stones and indistinct borders  MRI abdomen 02/07/2019- cholelithiasis, 6.6 cm masslike soft tissue density involving the gallbladder and adjacent liver suspicious for primary gallbladder carcinoma, enlarged portacaval node, no liver metastases, 6 mm distal bile duct stone with mild diffuse biliary ductal dilatation 2. History of atrial fibrillation-flutter, status post an ablation procedure 3. Hypertension 4. Adenomatous colon polyps on colonoscopy 02/20/2019   Transverse colon polyp not resected, biopsy revealed an inflammatory polyp with lymphoid aggregates   Disposition:   Mr. Chillemi is referred for oncology evaluation after abdomen images reveal evidence of masslike thickening of the gallbladder.  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  He plans to schedule surgery with Dr. Barry Dienes.  I will see him after surgery with the plan to consider adjuvant treatment options if gallbladder carcinoma is confirmed.  Management of the resected transverse colon polyp will be directed by Drs. Carlean Purl and [place].    Betsy Coder, MD  03/12/2019, 2:19 PM

## 2019-03-12 NOTE — Telephone Encounter (Signed)
Called and spoke with patient. Confirmed date and time  °

## 2019-03-13 ENCOUNTER — Inpatient Hospital Stay (HOSPITAL_COMMUNITY): Admission: RE | Admit: 2019-03-13 | Payer: Medicare Other | Source: Ambulatory Visit

## 2019-03-17 ENCOUNTER — Inpatient Hospital Stay: Admit: 2019-03-17 | Payer: Medicare Other | Admitting: General Surgery

## 2019-03-17 SURGERY — LAPAROSCOPY, DIAGNOSTIC
Anesthesia: General

## 2019-04-16 ENCOUNTER — Ambulatory Visit: Payer: Medicare Other | Admitting: Oncology

## 2019-04-21 ENCOUNTER — Encounter: Payer: Self-pay | Admitting: Internal Medicine

## 2019-04-21 ENCOUNTER — Telehealth: Payer: Self-pay | Admitting: Internal Medicine

## 2019-04-21 DIAGNOSIS — R932 Abnormal findings on diagnostic imaging of liver and biliary tract: Secondary | ICD-10-CM

## 2019-04-21 HISTORY — DX: Abnormal findings on diagnostic imaging of liver and biliary tract: R93.2

## 2019-04-21 NOTE — Progress Notes (Signed)
I did call and speak to him  See phone note  Please place a November 2020 colonoscopy recall  No letter

## 2019-04-21 NOTE — Telephone Encounter (Signed)
I called him and encouraged him to have his diagnostic laparoscopy per Dr. Barry Dienes  He says he feels fine  He says that his insurance is "messed up and needs to get straightened out before he does anything else"  Apparently not [employer] now but regular Medicare and he is working with Medicare to sort it out.  I again encouraged him to have the laparoscopy.

## 2019-06-23 ENCOUNTER — Other Ambulatory Visit: Payer: Self-pay | Admitting: Internal Medicine

## 2019-07-03 ENCOUNTER — Ambulatory Visit: Payer: Medicare Other | Admitting: Internal Medicine

## 2019-07-16 ENCOUNTER — Encounter: Payer: Self-pay | Admitting: Internal Medicine

## 2019-08-16 ENCOUNTER — Other Ambulatory Visit: Payer: Self-pay | Admitting: Internal Medicine

## 2019-08-21 ENCOUNTER — Ambulatory Visit: Payer: MEDICARE | Admitting: Internal Medicine

## 2019-08-31 ENCOUNTER — Other Ambulatory Visit: Payer: Self-pay | Admitting: Internal Medicine

## 2019-10-17 ENCOUNTER — Other Ambulatory Visit: Payer: Self-pay | Admitting: Internal Medicine

## 2019-10-17 NOTE — Telephone Encounter (Signed)
Please refill as per office routine med refill policy (all routine meds refilled for 3 mo or monthly per pt preference up to one year from last visit, then month to month grace period for 3 mo, then further med refills will have to be denied)  

## 2019-12-18 ENCOUNTER — Other Ambulatory Visit: Payer: Self-pay | Admitting: Internal Medicine

## 2019-12-18 NOTE — Telephone Encounter (Signed)
Please refill as per office routine med refill policy (all routine meds refilled for 3 mo or monthly per pt preference up to one year from last visit, then month to month grace period for 3 mo, then further med refills will have to be denied)  

## 2020-01-01 ENCOUNTER — Ambulatory Visit (INDEPENDENT_AMBULATORY_CARE_PROVIDER_SITE_OTHER): Payer: MEDICARE | Admitting: Internal Medicine

## 2020-01-01 ENCOUNTER — Encounter: Payer: Self-pay | Admitting: Internal Medicine

## 2020-01-01 ENCOUNTER — Other Ambulatory Visit: Payer: Self-pay

## 2020-01-01 ENCOUNTER — Ambulatory Visit (INDEPENDENT_AMBULATORY_CARE_PROVIDER_SITE_OTHER): Payer: MEDICARE

## 2020-01-01 VITALS — BP 160/86 | HR 80 | Temp 97.5°F | Ht 72.0 in | Wt 219.0 lb

## 2020-01-01 VITALS — BP 132/80 | HR 86 | Temp 98.3°F | Ht 72.0 in | Wt 220.6 lb

## 2020-01-01 DIAGNOSIS — R739 Hyperglycemia, unspecified: Secondary | ICD-10-CM

## 2020-01-01 DIAGNOSIS — E611 Iron deficiency: Secondary | ICD-10-CM

## 2020-01-01 DIAGNOSIS — D649 Anemia, unspecified: Secondary | ICD-10-CM

## 2020-01-01 DIAGNOSIS — R700 Elevated erythrocyte sedimentation rate: Secondary | ICD-10-CM | POA: Diagnosis not present

## 2020-01-01 DIAGNOSIS — Z0001 Encounter for general adult medical examination with abnormal findings: Secondary | ICD-10-CM

## 2020-01-01 DIAGNOSIS — I10 Essential (primary) hypertension: Secondary | ICD-10-CM

## 2020-01-01 DIAGNOSIS — Z125 Encounter for screening for malignant neoplasm of prostate: Secondary | ICD-10-CM | POA: Diagnosis not present

## 2020-01-01 DIAGNOSIS — E559 Vitamin D deficiency, unspecified: Secondary | ICD-10-CM | POA: Diagnosis not present

## 2020-01-01 DIAGNOSIS — E538 Deficiency of other specified B group vitamins: Secondary | ICD-10-CM

## 2020-01-01 DIAGNOSIS — Z0000 Encounter for general adult medical examination without abnormal findings: Secondary | ICD-10-CM | POA: Diagnosis not present

## 2020-01-01 LAB — HEPATIC FUNCTION PANEL
ALT: 14 U/L (ref 0–53)
AST: 18 U/L (ref 0–37)
Albumin: 4 g/dL (ref 3.5–5.2)
Alkaline Phosphatase: 59 U/L (ref 39–117)
Bilirubin, Direct: 0.1 mg/dL (ref 0.0–0.3)
Total Bilirubin: 0.3 mg/dL (ref 0.2–1.2)
Total Protein: 6.9 g/dL (ref 6.0–8.3)

## 2020-01-01 LAB — BASIC METABOLIC PANEL
BUN: 15 mg/dL (ref 6–23)
CO2: 35 mEq/L — ABNORMAL HIGH (ref 19–32)
Calcium: 9.6 mg/dL (ref 8.4–10.5)
Chloride: 101 mEq/L (ref 96–112)
Creatinine, Ser: 1.17 mg/dL (ref 0.40–1.50)
GFR: 74.04 mL/min (ref >60.00)
Glucose, Bld: 87 mg/dL (ref 70–99)
Potassium: 3.3 mEq/L — ABNORMAL LOW (ref 3.5–5.1)
Sodium: 140 mEq/L (ref 135–145)

## 2020-01-01 LAB — IBC PANEL
Iron: 91 ug/dL (ref 42–165)
Saturation Ratios: 25.4 % (ref 20.0–50.0)
Transferrin: 256 mg/dL (ref 212.0–360.0)

## 2020-01-01 LAB — FERRITIN: Ferritin: 15.6 ng/mL — ABNORMAL LOW (ref 22.0–322.0)

## 2020-01-01 LAB — LIPID PANEL
Cholesterol: 137 mg/dL (ref 0–200)
HDL: 31.4 mg/dL — ABNORMAL LOW (ref >39.00)
NonHDL: 105.9
Total CHOL/HDL Ratio: 4
Triglycerides: 229 mg/dL — ABNORMAL HIGH (ref 0.0–149.0)
VLDL: 45.8 mg/dL — ABNORMAL HIGH (ref 0.0–40.0)

## 2020-01-01 LAB — CBC WITH DIFFERENTIAL/PLATELET
Basophils Absolute: 0 10*3/uL (ref 0.0–0.1)
Basophils Relative: 0.4 % (ref 0.0–3.0)
Eosinophils Absolute: 0.1 10*3/uL (ref 0.0–0.7)
Eosinophils Relative: 1 % (ref 0.0–5.0)
HCT: 41.2 % (ref 39.0–52.0)
Hemoglobin: 13.7 g/dL (ref 13.0–17.0)
Lymphocytes Relative: 19.4 % (ref 12.0–46.0)
Lymphs Abs: 1.9 10*3/uL (ref 0.7–4.0)
MCHC: 33.3 g/dL (ref 30.0–36.0)
MCV: 86 fl (ref 78.0–100.0)
Monocytes Absolute: 1 10*3/uL (ref 0.1–1.0)
Monocytes Relative: 10.2 % (ref 3.0–12.0)
Neutro Abs: 6.8 10*3/uL (ref 1.4–7.7)
Neutrophils Relative %: 69 % (ref 43.0–77.0)
Platelets: 260 10*3/uL (ref 150.0–400.0)
RBC: 4.79 Mil/uL (ref 4.22–5.81)
RDW: 14.9 % (ref 11.5–15.5)
WBC: 9.9 10*3/uL (ref 4.0–10.5)

## 2020-01-01 LAB — VITAMIN D 25 HYDROXY (VIT D DEFICIENCY, FRACTURES): VITD: >120 ng/mL

## 2020-01-01 LAB — URINALYSIS, ROUTINE W REFLEX MICROSCOPIC
Bilirubin Urine: NEGATIVE
Hgb urine dipstick: NEGATIVE
Ketones, ur: NEGATIVE
Leukocytes,Ua: NEGATIVE
Nitrite: NEGATIVE
Specific Gravity, Urine: 1.02 (ref 1.000–1.030)
Total Protein, Urine: NEGATIVE
Urine Glucose: NEGATIVE
Urobilinogen, UA: 0.2 (ref 0.0–1.0)
pH: 7 (ref 5.0–8.0)

## 2020-01-01 LAB — LDL CHOLESTEROL, DIRECT: Direct LDL: 75 mg/dL

## 2020-01-01 LAB — VITAMIN B12: Vitamin B-12: 1266 pg/mL — ABNORMAL HIGH (ref 211–911)

## 2020-01-01 LAB — TSH: TSH: 0.53 u[IU]/mL (ref 0.35–4.50)

## 2020-01-01 LAB — HEMOGLOBIN A1C: Hgb A1c MFr Bld: 5.9 % (ref 4.6–6.5)

## 2020-01-01 LAB — PSA: PSA: 2.32 ng/mL (ref 0.10–4.00)

## 2020-01-01 LAB — SEDIMENTATION RATE: Sed Rate: 18 mm/hr (ref 0–20)

## 2020-01-01 NOTE — Progress Notes (Signed)
Subjective:    Patient ID: Marcus Boyd, male    DOB: 1946-01-02, 74 y.o.   MRN: [id number]  HPI  Here for wellness and f/u;  Overall doing ok;  Pt denies Chest pain, worsening SOB, DOE, wheezing, orthopnea, PND, worsening LE edema, palpitations, dizziness or syncope.  Pt denies neurological change such as new headache, facial or extremity weakness.  Pt denies polydipsia, polyuria, or low sugar symptoms. Pt states overall good compliance with treatment and medications, good tolerability, and has been trying to follow appropriate diet.  Pt denies worsening depressive symptoms, suicidal ideation or panic. No fever, night sweats, wt loss, loss of appetite, or other constitutional symptoms.  Pt states good ability with ADL's, has low fall risk, home safety reviewed and adequate, no other significant changes in hearing or vision, and only occasionally active with exercise. No new complaints   States BP at home < 140/90, declines med change for now Past Medical History:  Diagnosis Date  . Abnormal CT scan/MRI, gallbladder ? cancer 04/21/2019  . Atrial fibrillation, new onset ([place]) 11/16/2011  . ERECTILE DYSFUNCTION 04/10/2007  . Exertional dyspnea    "related to my heart problems"  . Flutter-fibrillation ([place])   . Hx of adenomatous colonic polyps 02/20/2019  . Hyperlipemia 11/14/2010  . HYPERTENSION 04/10/2007   Past Surgical History:  Procedure Laterality Date  . ATRIAL FLUTTER ABLATION N/A 03/12/2012   Procedure: ATRIAL FLUTTER ABLATION;  Surgeon: Evans Lance, MD;  Location: [facility] CATH LAB;  Service: Cardiovascular;  Laterality: N/A;  . COLONOSCOPY  04/22/2008  . POLYPECTOMY    . RADIOFREQUENCY ABLATION  03/12/12    reports that he quit smoking about 31 years ago. His smoking use included cigarettes. He has a 20.00 pack-year smoking history. He has never used smokeless tobacco. He reports previous alcohol use. He reports previous drug use. Drug: Marijuana. family history includes Cancer in his  father; Hypertension in his mother and sister. No Known Allergies Current Outpatient Medications on File Prior to Visit  Medication Sig Dispense Refill  . amLODipine (NORVASC) 10 MG tablet Take 1 tablet (10 mg total) by mouth daily. 90 tablet 3  . aspirin 81 MG tablet Take 81 mg by mouth daily.    Marland Kitchen atorvastatin (LIPITOR) 10 MG tablet Take 1 tablet (10 mg total) by mouth daily at 6 PM. Annual appt due in May w/labs must see provider for future refills 90 tablet 0  . hydrochlorothiazide (HYDRODIURIL) 25 MG tablet TAKE 1 TABLET BY MOUTH EVERY DAY Annual appt due in May must see provider for future refills 30 tablet 0  . ibuprofen (ADVIL,MOTRIN) 200 MG tablet Take 200 mg by mouth every 6 (six) hours as needed for moderate pain.    Marland Kitchen lisinopril (ZESTRIL) 40 MG tablet TAKE 1 TABLET BY MOUTH EVERY DAY Annual appt due in May must see provider for future refills 90 tablet 0  . metoprolol succinate (TOPROL-XL) 100 MG 24 hr tablet TAKE 1 TABLET BY MOUTH EVERY DAY WITH OR IMMEDIATELY AFTER FOOD 90 tablet 3  . Multiple Vitamin (MULTIVITAMIN) tablet Take 1 tablet by mouth daily.      . potassium chloride (K-DUR) 10 MEQ tablet TAKE 1 TABLET BY MOUTH EVERY DAY 90 tablet 1   No current facility-administered medications on file prior to visit.   Review of Systems All otherwise neg per pt     Objective:   Physical Exam BP (!) 160/86 (BP Location: Left Arm, Patient Position: Sitting, Cuff Size: Large)   Pulse  80   Temp (!) 97.5 F (36.4 C) (Oral)   Ht 6' (1.829 m)   Wt 219 lb (99.3 kg)   SpO2 97%   BMI 29.70 kg/m  VS noted,  Constitutional: Pt appears in NAD HENT: Head: NCAT.  Right Ear: External ear normal.  Left Ear: External ear normal.  Eyes: . Pupils are equal, round, and reactive to light. Conjunctivae and EOM are normal Nose: without d/c or deformity Neck: Neck supple. Gross normal ROM Cardiovascular: Normal rate and regular rhythm.   Pulmonary/Chest: Effort normal and breath sounds  without rales or wheezing.  Abd:  Soft, NT, ND, + BS, no organomegaly Neurological: Pt is alert. At baseline orientation, motor grossly intact Skin: Skin is warm. No rashes, other new lesions, no LE edema Psychiatric: Pt behavior is normal without agitation  All otherwise neg per pt Lab Results  Component Value Date   WBC 9.9 01/01/2020   HGB 13.7 01/01/2020   HCT 41.2 01/01/2020   PLT 260.0 01/01/2020   GLUCOSE 87 01/01/2020   CHOL 137 01/01/2020   TRIG 229.0 (H) 01/01/2020   HDL 31.40 (L) 01/01/2020   LDLDIRECT 75.0 01/01/2020   LDLCALC 62 12/26/2018   ALT 14 01/01/2020   AST 18 01/01/2020   NA 140 01/01/2020   K 3.3 (L) 01/01/2020   CL 101 01/01/2020   CREATININE 1.17 01/01/2020   BUN 15 01/01/2020   CO2 35 (H) 01/01/2020   TSH 0.53 01/01/2020   PSA 2.32 01/01/2020   INR 1.6 (H) 04/04/2012   HGBA1C 5.9 01/01/2020      Assessment & Plan:

## 2020-01-01 NOTE — Patient Instructions (Signed)
[name] , Thank you for taking time to come for your Medicare Wellness Visit. I appreciate your ongoing commitment to your health goals. Please review the following plan we discussed and let me know if I can assist you in the future.   Screening recommendations/referrals: Colonoscopy: not recommended due to age Recommended yearly ophthalmology/optometry visit for glaucoma screening and checkup Recommended yearly dental visit for hygiene and checkup  Vaccinations: Influenza vaccine: 06/2019 Pneumococcal vaccine: completed Tdap vaccine: 12/09/2015 Shingles vaccine: not completed; will check with local pharmacy Covid-19: completed  Advanced directives: yes; please bring copy to next appointment  Next appointment: please schedule your annual wellness visit in 1 year.  Preventive Care 24 Years and Older, Male Preventive care refers to lifestyle choices and visits with your health care provider that can promote health and wellness. What does preventive care include?  A yearly physical exam. This is also called an annual well check.  Dental exams once or twice a year.  Routine eye exams. Ask your health care provider how often you should have your eyes checked.  Personal lifestyle choices, including:  Daily care of your teeth and gums.  Regular physical activity.  Eating a healthy diet.  Avoiding tobacco and drug use.  Limiting alcohol use.  Practicing safe sex.  Taking low doses of aspirin every day.  Taking vitamin and mineral supplements as recommended by your health care provider. What happens during an annual well check? The services and screenings done by your health care provider during your annual well check will depend on your age, overall health, lifestyle risk factors, and family history of disease. Counseling  Your health care provider may ask you questions about your:  Alcohol use.  Tobacco use.  Drug use.  Emotional well-being.  Home and  relationship well-being.  Sexual activity.  Eating habits.  History of falls.  Memory and ability to understand (cognition).  Work and work Statistician. Screening  You may have the following tests or measurements:  Height, weight, and BMI.  Blood pressure.  Lipid and cholesterol levels. These may be checked every 5 years, or more frequently if you are over 52 years old.  Skin check.  Lung cancer screening. You may have this screening every year starting at age 41 if you have a 30-pack-year history of smoking and currently smoke or have quit within the past 15 years.  Fecal occult blood test (FOBT) of the stool. You may have this test every year starting at age 35.  Flexible sigmoidoscopy or colonoscopy. You may have a sigmoidoscopy every 5 years or a colonoscopy every 10 years starting at age 35.  Prostate cancer screening. Recommendations will vary depending on your family history and other risks.  Hepatitis C blood test.  Hepatitis B blood test.  Sexually transmitted disease (STD) testing.  Diabetes screening. This is done by checking your blood sugar (glucose) after you have not eaten for a while (fasting). You may have this done every 1-3 years.  Abdominal aortic aneurysm (AAA) screening. You may need this if you are a current or former smoker.  Osteoporosis. You may be screened starting at age 56 if you are at high risk. Talk with your health care provider about your test results, treatment options, and if necessary, the need for more tests. Vaccines  Your health care provider may recommend certain vaccines, such as:  Influenza vaccine. This is recommended every year.  Tetanus, diphtheria, and acellular pertussis (Tdap, Td) vaccine. You may need a Td booster every  10 years.  Zoster vaccine. You may need this after age 50.  Pneumococcal 13-valent conjugate (PCV13) vaccine. One dose is recommended after age 75.  Pneumococcal polysaccharide (PPSV23) vaccine. One  dose is recommended after age 72. Talk to your health care provider about which screenings and vaccines you need and how often you need them. This information is not intended to replace advice given to you by your health care provider. Make sure you discuss any questions you have with your health care provider. Document Released: 08/26/2015 Document Revised: 04/18/2016 Document Reviewed: 05/31/2015 Elsevier Interactive Patient Education  2017 [place] Prevention in the Home Falls can cause injuries. They can happen to people of all ages. There are many things you can do to make your home safe and to help prevent falls. What can I do on the outside of my home?  Regularly fix the edges of walkways and driveways and fix any cracks.  Remove anything that might make you trip as you walk through a door, such as a raised step or threshold.  Trim any bushes or trees on the path to your home.  Use bright outdoor lighting.  Clear any walking paths of anything that might make someone trip, such as rocks or tools.  Regularly check to see if handrails are loose or broken. Make sure that both sides of any steps have handrails.  Any raised decks and porches should have guardrails on the edges.  Have any leaves, snow, or ice cleared regularly.  Use sand or salt on walking paths during winter.  Clean up any spills in your garage right away. This includes oil or grease spills. What can I do in the bathroom?  Use night lights.  Install grab bars by the toilet and in the tub and shower. Do not use towel bars as grab bars.  Use non-skid mats or decals in the tub or shower.  If you need to sit down in the shower, use a plastic, non-slip stool.  Keep the floor dry. Clean up any water that spills on the floor as soon as it happens.  Remove soap buildup in the tub or shower regularly.  Attach bath mats securely with double-sided non-slip rug tape.  Do not have throw rugs and other  things on the floor that can make you trip. What can I do in the bedroom?  Use night lights.  Make sure that you have a light by your bed that is easy to reach.  Do not use any sheets or blankets that are too big for your bed. They should not hang down onto the floor.  Have a firm chair that has side arms. You can use this for support while you get dressed.  Do not have throw rugs and other things on the floor that can make you trip. What can I do in the kitchen?  Clean up any spills right away.  Avoid walking on wet floors.  Keep items that you use a lot in easy-to-reach places.  If you need to reach something above you, use a strong step stool that has a grab bar.  Keep electrical cords out of the way.  Do not use floor polish or wax that makes floors slippery. If you must use wax, use non-skid floor wax.  Do not have throw rugs and other things on the floor that can make you trip. What can I do with my stairs?  Do not leave any items on the stairs.  Make sure  that there are handrails on both sides of the stairs and use them. Fix handrails that are broken or loose. Make sure that handrails are as long as the stairways.  Check any carpeting to make sure that it is firmly attached to the stairs. Fix any carpet that is loose or worn.  Avoid having throw rugs at the top or bottom of the stairs. If you do have throw rugs, attach them to the floor with carpet tape.  Make sure that you have a light switch at the top of the stairs and the bottom of the stairs. If you do not have them, ask someone to add them for you. What else can I do to help prevent falls?  Wear shoes that:  Do not have high heels.  Have rubber bottoms.  Are comfortable and fit you well.  Are closed at the toe. Do not wear sandals.  If you use a stepladder:  Make sure that it is fully opened. Do not climb a closed stepladder.  Make sure that both sides of the stepladder are locked into place.  Ask  someone to hold it for you, if possible.  Clearly mark and make sure that you can see:  Any grab bars or handrails.  First and last steps.  Where the edge of each step is.  Use tools that help you move around (mobility aids) if they are needed. These include:  Canes.  Walkers.  Scooters.  Crutches.  Turn on the lights when you go into a dark area. Replace any light bulbs as soon as they burn out.  Set up your furniture so you have a clear path. Avoid moving your furniture around.  If any of your floors are uneven, fix them.  If there are any pets around you, be aware of where they are.  Review your medicines with your doctor. Some medicines can make you feel dizzy. This can increase your chance of falling. Ask your doctor what other things that you can do to help prevent falls. This information is not intended to replace advice given to you by your health care provider. Make sure you discuss any questions you have with your health care provider. Document Released: 05/26/2009 Document Revised: 01/05/2016 Document Reviewed: 09/03/2014 Elsevier Interactive Patient Education  2017 [employer].

## 2020-01-01 NOTE — Progress Notes (Addendum)
Subjective:   Marcus Boyd is a 74 y.o. male who presents for Medicare Annual/Subsequent preventive examination.  Review of Systems:  No ROS. Medicare Wellness Visit Cardiac Risk Factors include: advanced age (>26men, >56 women);dyslipidemia;hypertension;male gender     Objective:    Vitals: BP 132/80 (BP Location: Left Arm, Patient Position: Sitting, Cuff Size: Normal)   Pulse 86   Temp 98.3 F (36.8 C)   Ht 6' (1.829 m)   Wt 220 lb 9.6 oz (100.1 kg)   SpO2 95%   BMI 29.92 kg/m   Body mass index is 29.92 kg/m.  Advanced Directives 01/01/2020 12/25/2018 12/20/2017 09/11/2017 09/08/2017 06/09/2016 03/12/2012  Does Patient Have a Medical Advance Directive? Yes No No No No No Patient does not have advance directive;Patient would not like information  Does patient want to make changes to medical advance directive? No - Patient declined - - - - - -  Would patient like information on creating a medical advance directive? - Yes (ED - Information included in AVS) No - Patient declined - No - Patient declined No - patient declined information -    Tobacco Social History   Tobacco Use  Smoking Status Former Smoker  . Packs/day: 1.00  . Years: 20.00  . Pack years: 20.00  . Types: Cigarettes  . Quit date: 08/13/1988  . Years since quitting: 31.4  Smokeless Tobacco Never Used  Tobacco Comment   03/12/12 "quit smoking in the 1990's"     Counseling given: No Comment: 03/12/12 "quit smoking in the 1990's"   Clinical Intake:  Pre-visit preparation completed: Yes  Pain : No/denies pain Pain Score: 0-No pain     BMI - recorded: 29.92 Nutritional Status: BMI 25 -29 Overweight Nutritional Risks: None Diabetes: No  How often do you need to have someone help you when you read instructions, pamphlets, or other written materials from your doctor or pharmacy?: 1 - Never What is the last grade level you completed in school?: HSG  Interpreter Needed?: No  Information entered by  :: Reilynn Lauro N. Lowell Guitar, LPN  Past Medical History:  Diagnosis Date  . Abnormal CT scan/MRI, gallbladder ? cancer 04/21/2019  . Atrial fibrillation, new onset ([place]) 11/16/2011  . ERECTILE DYSFUNCTION 04/10/2007  . Exertional dyspnea    "related to my heart problems"  . Flutter-fibrillation ([place])   . Hx of adenomatous colonic polyps 02/20/2019  . Hyperlipemia 11/14/2010  . HYPERTENSION 04/10/2007   Past Surgical History:  Procedure Laterality Date  . ATRIAL FLUTTER ABLATION N/A 03/12/2012   Procedure: ATRIAL FLUTTER ABLATION;  Surgeon: Evans Lance, MD;  Location: [facility] CATH LAB;  Service: Cardiovascular;  Laterality: N/A;  . COLONOSCOPY  04/22/2008  . POLYPECTOMY    . RADIOFREQUENCY ABLATION  03/12/12   Family History  Problem Relation Age of Onset  . Hypertension Mother   . Cancer Father        unsure what kind of cancer  . Hypertension Sister        twin  . Colon cancer Neg Hx   . Colon polyps Neg Hx   . Esophageal cancer Neg Hx   . Rectal cancer Neg Hx   . Stomach cancer Neg Hx    Social History   Socioeconomic History  . Marital status: Legally Separated    Spouse name: Not on file  . Number of children: 4  . Years of education: 11  . Highest education level: High school graduate  Occupational History  . Occupation: Truck driver/  retired    Fish farm manager: [employer]  Tobacco Use  . Smoking status: Former Smoker    Packs/day: 1.00    Years: 20.00    Pack years: 20.00    Types: Cigarettes    Quit date: 08/13/1988    Years since quitting: 31.4  . Smokeless tobacco: Never Used  . Tobacco comment: 03/12/12 "quit smoking in the 1990's"  Substance and Sexual Activity  . Alcohol use: Not Currently    Comment: occ.wine or beer per pt-none recently   . Drug use: Not Currently    Types: Marijuana    Comment: 03/12/12 "[place] marijuana was [place]; 1970's"  . Sexual activity: Yes  Other Topics Concern  . Not on file  Social History Narrative  . Not on file   Social  Determinants of Health   Financial Resource Strain:   . Difficulty of Paying Living Expenses:   Food Insecurity:   . Worried About Charity fundraiser in the Last Year:   . Arboriculturist in the Last Year:   Transportation Needs:   . Film/video editor (Medical):   Marland Kitchen Lack of Transportation (Non-Medical):   Physical Activity:   . Days of Exercise per Week:   . Minutes of Exercise per Session:   Stress:   . Feeling of Stress :   Social Connections:   . Frequency of Communication with Friends and Family:   . Frequency of Social Gatherings with Friends and Family:   . Attends Religious Services:   . Active Member of Clubs or Organizations:   . Attends Archivist Meetings:   Marland Kitchen Marital Status:     Outpatient Encounter Medications as of 01/01/2020  Medication Sig  . amLODipine (NORVASC) 10 MG tablet Take 1 tablet (10 mg total) by mouth daily.  Marland Kitchen aspirin 81 MG tablet Take 81 mg by mouth daily.  Marland Kitchen atorvastatin (LIPITOR) 10 MG tablet Take 1 tablet (10 mg total) by mouth daily at 6 PM. Annual appt due in May w/labs must see provider for future refills  . hydrochlorothiazide (HYDRODIURIL) 25 MG tablet TAKE 1 TABLET BY MOUTH EVERY DAY Annual appt due in May must see provider for future refills  . ibuprofen (ADVIL,MOTRIN) 200 MG tablet Take 200 mg by mouth every 6 (six) hours as needed for moderate pain.  Marland Kitchen lisinopril (ZESTRIL) 40 MG tablet TAKE 1 TABLET BY MOUTH EVERY DAY Annual appt due in May must see provider for future refills  . metoprolol succinate (TOPROL-XL) 100 MG 24 hr tablet TAKE 1 TABLET BY MOUTH EVERY DAY WITH OR IMMEDIATELY AFTER FOOD  . Multiple Vitamin (MULTIVITAMIN) tablet Take 1 tablet by mouth daily.    . potassium chloride (K-DUR) 10 MEQ tablet TAKE 1 TABLET BY MOUTH EVERY DAY   No facility-administered encounter medications on file as of 01/01/2020.    Activities of Daily Living In your present state of health, do you have any difficulty performing the  following activities: 01/01/2020  Hearing? N  Vision? N  Difficulty concentrating or making decisions? N  Walking or climbing stairs? N  Dressing or bathing? N  Doing errands, shopping? N  Preparing Food and eating ? N  Using the Toilet? N  In the past six months, have you accidently leaked urine? N  Do you have problems with loss of bowel control? N  Managing your Medications? N  Managing your Finances? N  Housekeeping or managing your Housekeeping? N  Some recent data might be hidden  Patient Care Team: Biagio Borg, MD as PCP - Cordella Register, Champ Mungo, MD as Consulting Physician (Cardiology) Gatha Mayer, MD as Consulting Physician (Gastroenterology) Coralie Keens, MD as Consulting Physician (General Surgery) Ladell Pier, MD as Consulting Physician (Oncology)   Assessment:   This is a routine wellness examination for [place].  Exercise Activities and Dietary recommendations Current Exercise Habits: The patient has a physically strenuous job, but has no regular exercise apart from work., Exercise limited by: None identified  Goals    . Patient Stated     Cut down the amount of salt I consume, eat more vegetables, eat mainly bake meat, continue to drink 2-3 bottles of water daily. Enjoy life and family.       Fall Risk Fall Risk  01/01/2020 12/25/2018 12/20/2017 12/14/2016 12/09/2015  Falls in the past year? 0 0 No No No  Number falls in past yr: 0 0 - - -  Injury with Fall? 0 - - - -  Risk for fall due to : No Fall Risks - - - -  Follow up Falls evaluation completed;Education provided;Falls prevention discussed - - - -   Is the patient's home free of loose throw rugs in walkways, pet beds, electrical cords, etc?   yes      Grab bars in the bathroom? no      Handrails on the stairs?   no      Adequate lighting?   yes  Depression Screen PHQ 2/9 Scores 01/01/2020 12/25/2018 12/20/2017 12/14/2016  PHQ - 2 Score 0 0 0 0  PHQ- 9 Score - - 0 -    Cognitive  Function MMSE - Mini Mental State Exam 12/20/2017  Orientation to time 5  Orientation to Place 5  Registration 3  Attention/ Calculation 5  Recall 2  Language- name 2 objects 2  Language- repeat 1  Language- follow 3 step command 3  Language- read & follow direction 1  Write a sentence 1  Copy design 1  Total score 29        Immunization History  Administered Date(s) Administered  . Influenza-Unspecified 06/13/2018  . Pneumococcal Conjugate-13 11/27/2013  . Pneumococcal Polysaccharide-23 12/14/2016  . Td 08/13/2004  . Tdap 12/09/2015  . Varicella Zoster Immune Globulin 11/14/2010    Qualifies for Shingles Vaccine? Yes, will check with local pharmacy.  Screening Tests Health Maintenance  Topic Date Due  . COVID-19 Vaccine (1) Never done  . INFLUENZA VACCINE  03/13/2020  . TETANUS/TDAP  12/08/2025  . COLONOSCOPY  02/19/2029  . Hepatitis C Screening  Completed  . PNA vac Low Risk Adult  Completed   Cancer Screenings: Lung: Low Dose CT Chest recommended if Age 45-80 years, 30 pack-year currently smoking OR have quit w/in 15years. Patient does not qualify. Colorectal: not indicated due to age  Additional Screenings: Hepatitis C Screening: completed      Plan:     I have personally reviewed and noted the following in the patient's chart:   . Medical and social history . Use of alcohol, tobacco or illicit drugs  . Current medications and supplements . Functional ability and status . Nutritional status . Physical activity . Advanced directives . List of other physicians . Hospitalizations, surgeries, and ER visits in previous 12 months . Vitals . Screenings to include cognitive, depression, and falls . Referrals and appointments  In addition, I have reviewed and discussed with patient certain preventive protocols, quality metrics, and best practice recommendations. A written personalized  care plan for preventive services as well as general preventive health  recommendations were provided to patient.     Sheral Flow, LPN  [facility] Nurse Health Advisor  Medical screening examination/treatment/procedure(s) were performed by non-physician practitioner and as supervising physician I was immediately available for consultation/collaboration. I agree with above. Cathlean Cower, MD

## 2020-01-01 NOTE — Patient Instructions (Signed)

## 2020-01-02 ENCOUNTER — Encounter: Payer: Self-pay | Admitting: Internal Medicine

## 2020-01-02 NOTE — Assessment & Plan Note (Signed)
Pt adamant bp at home controlled, cont to f/u bp at home and next visist

## 2020-01-02 NOTE — Assessment & Plan Note (Signed)
stable overall by history and exam, recent data reviewed with pt, and pt to continue medical treatment as before,  to f/u any worsening symptoms or concerns  

## 2020-01-02 NOTE — Assessment & Plan Note (Signed)
For fu lab,  to f/u any worsening symptoms or concerns  

## 2020-01-02 NOTE — Assessment & Plan Note (Signed)

## 2020-01-02 NOTE — Assessment & Plan Note (Signed)
For f/u lab, etiology unclear

## 2020-01-09 ENCOUNTER — Other Ambulatory Visit: Payer: Self-pay | Admitting: Internal Medicine

## 2020-01-09 NOTE — Telephone Encounter (Signed)
Please refill as per office routine med refill policy (all routine meds refilled for 3 mo or monthly per pt preference up to one year from last visit, then month to month grace period for 3 mo, then further med refills will have to be denied)  

## 2020-01-11 ENCOUNTER — Other Ambulatory Visit: Payer: Self-pay | Admitting: Internal Medicine

## 2020-01-11 NOTE — Telephone Encounter (Signed)
Please refill as per office routine med refill policy (all routine meds refilled for 3 mo or monthly per pt preference up to one year from last visit, then month to month grace period for 3 mo, then further med refills will have to be denied)  

## 2020-01-29 ENCOUNTER — Telehealth: Payer: Self-pay

## 2020-01-29 NOTE — Telephone Encounter (Signed)
-----   Message from Irving Copas., MD sent at 01/27/2020  1:10 PM EDT ----- Regarding: RE: Follow up Thanks. Let me know. GM ----- Message ----- From: Gatha Mayer, MD Sent: 01/27/2020  12:09 PM EDT To: Sherell Christoffel E [place], CMA, # Subject: RE: Follow up                                  We will call him and ask him to come see me to follow-up his gallbladder problem  ----- Message ----- From: Irving Copas., MD Sent: 01/27/2020   9:07 AM EDT To: Gatha Mayer, MD Subject: Follow up                                      CG, Just looking through some older patients who had been referred to me. He never followed up with [facility] and seems to be stable per PCP notation. Do you want to see him first in follow up before we try to meet and talk with him about any additional procedures? Thanks. GM

## 2020-01-29 NOTE — Telephone Encounter (Signed)
I spoke with Marcus Boyd and he declines to make an appointment at this time. He said he is not having any problems and he has my name and number if he changes his mind and he hung up on me.

## 2020-02-04 ENCOUNTER — Encounter: Payer: Self-pay | Admitting: Internal Medicine

## 2020-02-04 NOTE — Telephone Encounter (Signed)
Got it. Let me know if needed in future. GM

## 2020-02-04 NOTE — Telephone Encounter (Signed)
This encounter was created in error - please disregard.

## 2020-04-05 ENCOUNTER — Other Ambulatory Visit: Payer: Self-pay | Admitting: Internal Medicine

## 2020-06-04 IMAGING — MR MRI ABDOMEN WITH AND WITHOUT CONTRAST
19 of 21 series · 43 of 48 positions shown · IV contrast (Multihance 20cc)
Comparison: CT on 01/07/2019

CLINICAL DATA: Indeterminate liver lesions on recent CT.
Gallbladder carcinoma. Biliary ductal dilatation and probable
choledocholithiasis.

EXAM:
MRI ABDOMEN WITHOUT AND WITH CONTRAST
TECHNIQUE: Multiplanar multisequence MR imaging of the abdomen was performed
both before and after the administration of intravenous contrast.
CONTRAST:  20mL MULTIHANCE GADOBENATE DIMEGLUMINE 529 MG/ML IV SOLN

[Series 5: T2 · coronal · 5.0mm · 1.56mm/px · 1 of 36 slices shown (1 of 4)]
[im 1/36]
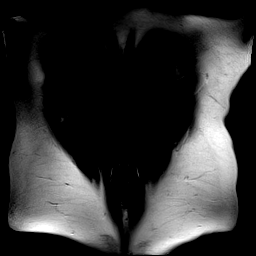

[Series 6: T1 · axial · 3.0mm · 1.19mm/px · z∈[-119,+118]mm · 4 of 160 slices shown]
[im 1/160]
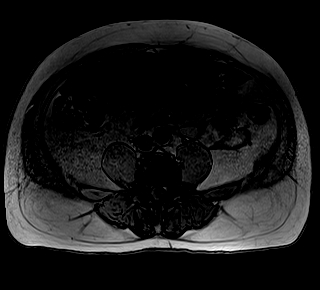
[im 54/160]
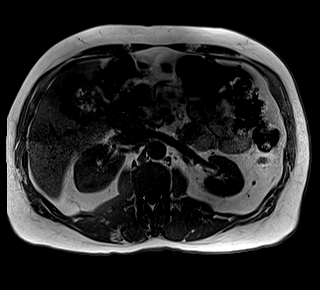
[im 107/160]
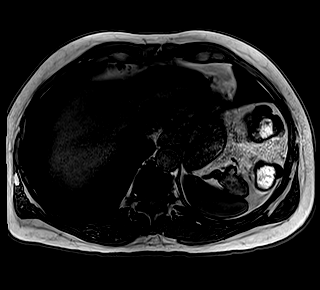
[im 160/160]
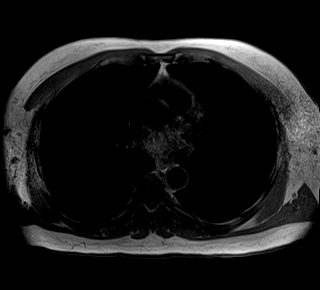

[Series 7: T2 · axial · 6.0mm · 1.22mm/px · 1 of 33 slices shown (2 of 4)]
[im 1/33]
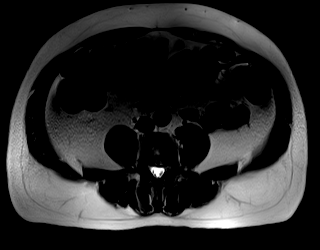

[Series 8: bSSFP · axial · 5.0mm · 1.25mm/px · 1 of 38 slices shown]
[im 1/38]
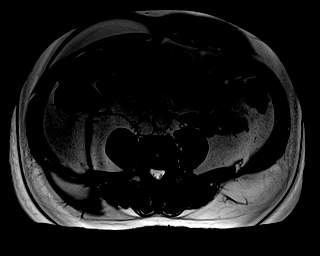

[Series 11: T2 · axial · 5.0mm · 1.48mm/px · 1 of 45 slices shown (3 of 4)]
[im 1/45]
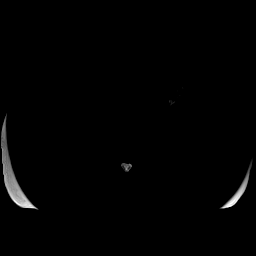

[Series 12: DWI · axial · 5.0mm · 1.42mm/px · z∈[-100,+134]mm · 3 of 120 slices shown (1 of 2)]
[im 1/120]
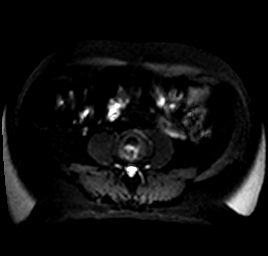
[im 60/120]
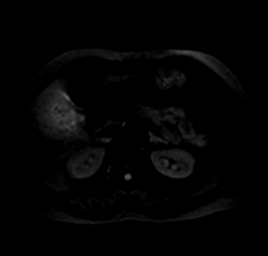
[im 120/120]
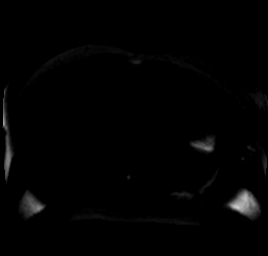

[Series 13: DWI · axial · 5.0mm · 1.42mm/px · 1 of 40 slices shown (2 of 2)]
[im 1/40]
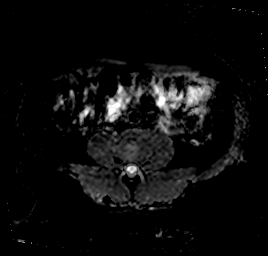

[Series 14: MRCP · coronal · 1.0mm · 0.49mm/px · 2 of 68 slices shown]
[im 1/68]
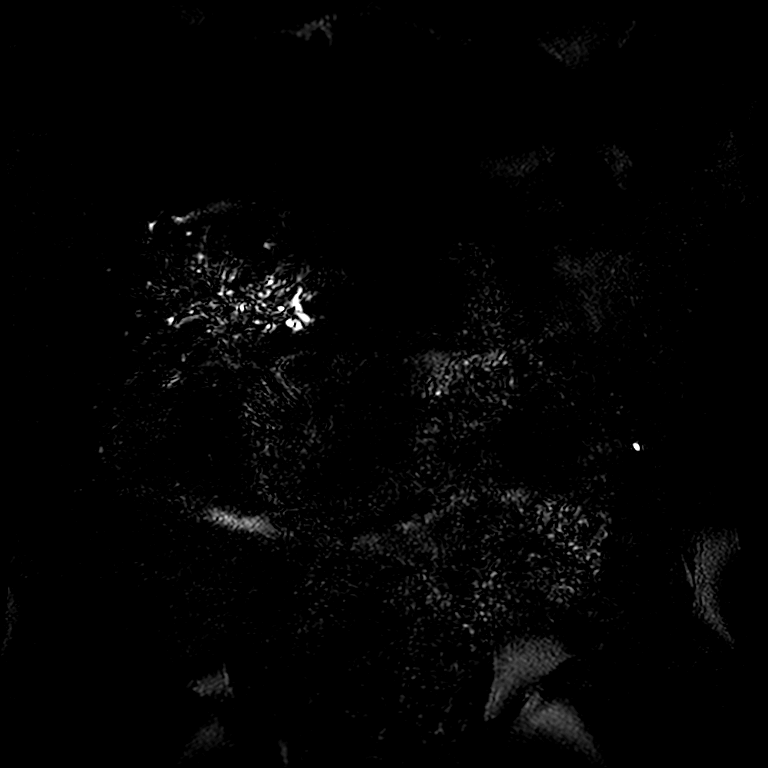
[im 68/68]
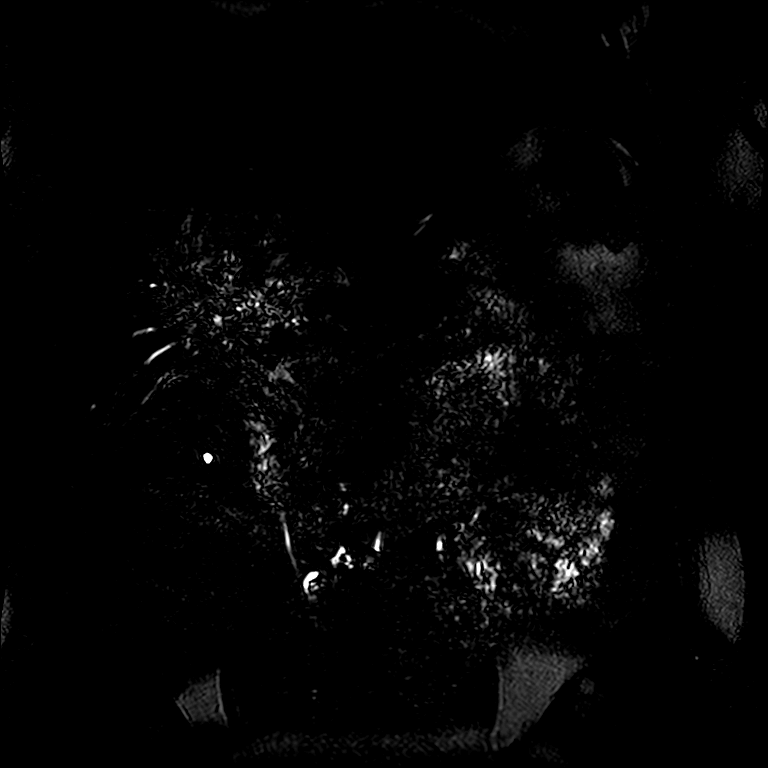

[Series 16: T2 · coronal · 3.0mm · 1.19mm/px · 2 of 53 slices shown (4 of 4)]
[im 1/53]
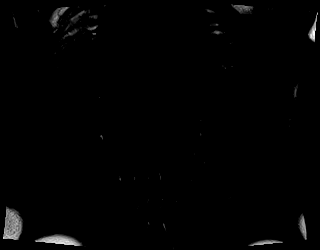
[im 53/53]
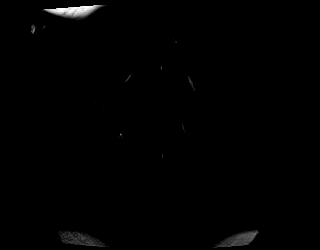

[Series 18: T1 dynamic · axial · non-contrast · 3.0mm · 1.25mm/px · z∈[-127,+110]mm · 3 of 80 slices shown]
[im 1/80]
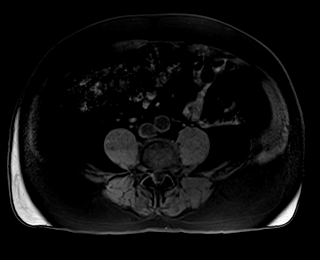
[im 40/80]
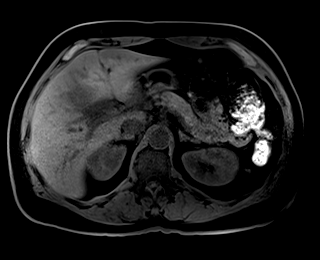
[im 80/80]
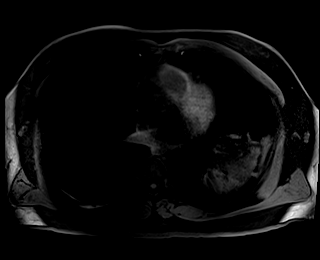

[Series 19: T1 dynamic post-contrast · axial · 3.0mm · 1.25mm/px · z∈[-127,+110]mm · 3 of 80 slices shown (1 of 9)]
[im 1/80]
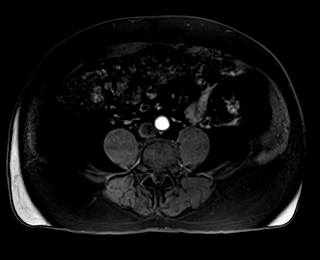
[im 40/80]
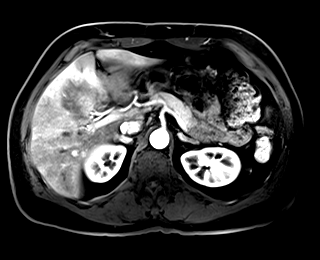
[im 80/80]
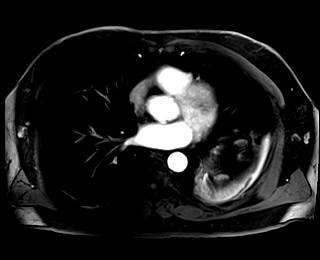

[Series 20: T1 dynamic post-contrast · axial · 3.0mm · 1.25mm/px · z∈[-127,+110]mm · 3 of 80 slices shown (2 of 9)]
[im 1/80]
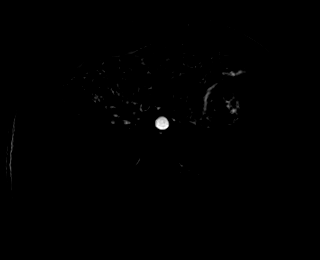
[im 40/80]
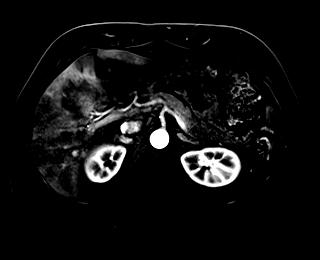
[im 80/80]
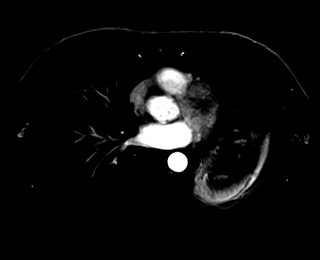

[Series 21: T1 dynamic post-contrast · axial · 3.0mm · 1.25mm/px · z∈[-127,+110]mm · 3 of 80 slices shown (3 of 9)]
[im 1/80]
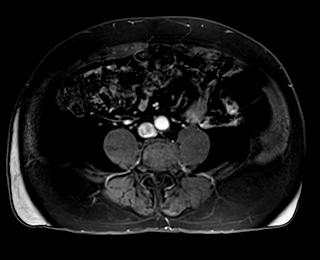
[im 40/80]
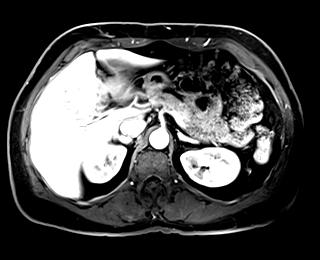
[im 80/80]
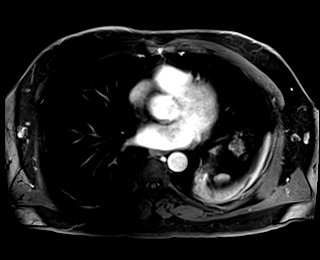

[Series 22: T1 dynamic post-contrast · axial · 3.0mm · 1.25mm/px · z∈[-127,+110]mm · 3 of 80 slices shown (4 of 9)]
[im 1/80]
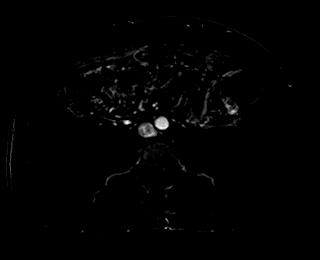
[im 40/80]
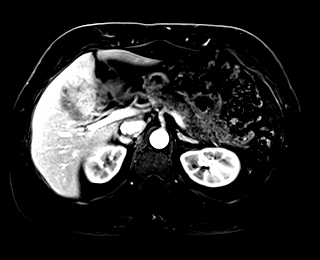
[im 80/80]
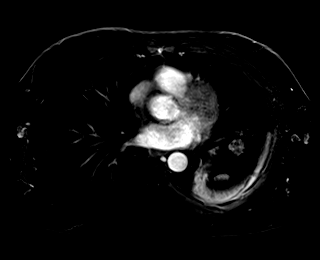

[Series 23: T1 dynamic post-contrast · axial · 3.0mm · 1.25mm/px · z∈[-127,+110]mm · 3 of 80 slices shown (5 of 9)]
[im 1/80]
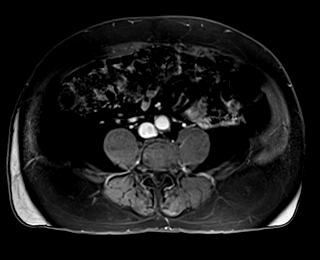
[im 40/80]
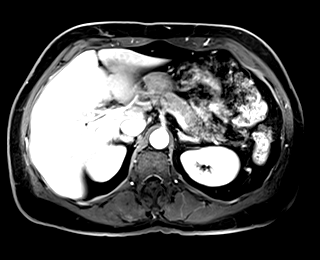
[im 80/80]
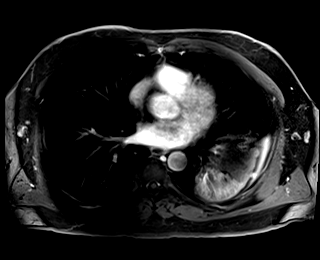

[Series 24: T1 dynamic post-contrast · axial · 3.0mm · 1.25mm/px · z∈[-127,+110]mm · 3 of 80 slices shown (6 of 9)]
[im 1/80]
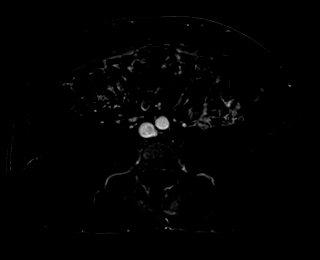
[im 40/80]
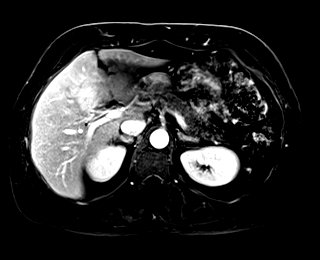
[im 80/80]
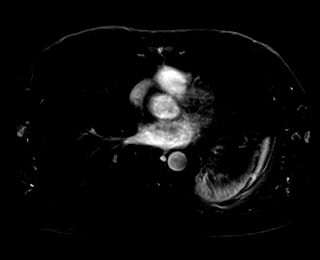

[Series 25: T1 dynamic post-contrast · coronal · 3.0mm · 1.25mm/px · 2 of 72 slices shown (7 of 9)]
[im 1/72]
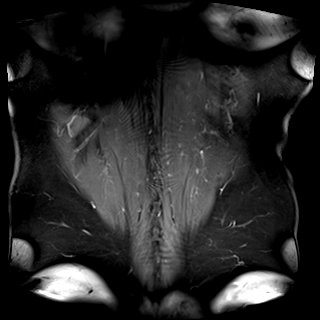
[im 72/72]
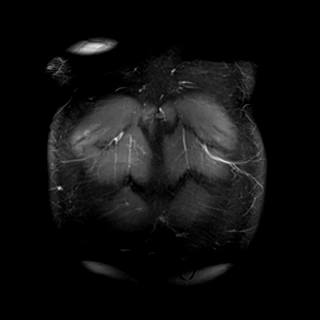

[Series 26: T1 dynamic post-contrast · axial · 3.0mm · 1.25mm/px · z∈[-127,+110]mm · 3 of 80 slices shown (8 of 9)]
[im 1/80]
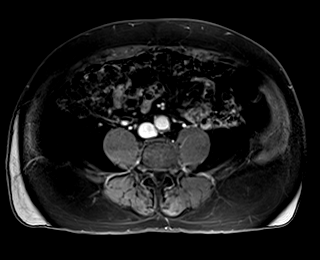
[im 40/80]
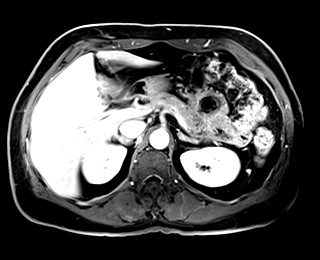
[im 80/80]
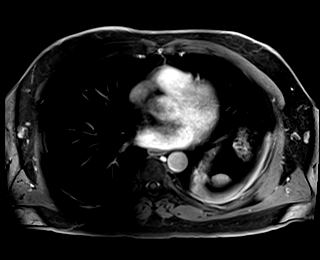

[Series 27: T1 dynamic post-contrast · axial · 3.0mm · 1.25mm/px · 1 of 80 slices shown (9 of 9)]
[im 1/80]
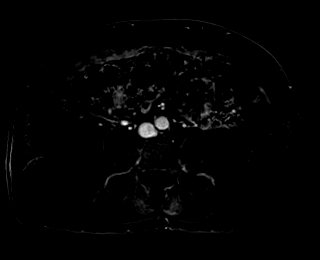

[43 of 48 positions shown; findings below may reference images not displayed]

FINDINGS: Lower chest: No acute findings.

Hepatobiliary: The gallbladder is filled with small gallstones.
Diffuse gallbladder wall soft tissue thickening and enhancement is
seen which shows ill-defined margins with the adjacent hepatic
parenchyma, consistent with hepatic involvement. This measures
approximately 6.6 x 6.3 cm in is highly suspicious for gallbladder
carcinoma. Several other small cysts seen, however no other liver
masses are identified.

Mild diffuse biliary ductal dilatation is seen, with common bile
duct measuring 10 mm in diameter. A 6 mm calculus is seen within the
distal common bile duct.

Pancreas: No mass or inflammatory changes. No evidence of pancreatic
ductal dilatation.

Spleen:  Within normal limits in size and appearance.

Adrenals/Urinary Tract: No masses identified. No evidence of
hydronephrosis.

Stomach/Bowel: Visualized portion unremarkable.

Vascular/Lymphatic: 1.7 cm portacaval lymph node, suspicious for
metastatic disease. No other pathologically enlarged lymph nodes
identified. No abdominal aortic aneurysm.

Other:  None.

Musculoskeletal:  No suspicious bone lesions identified.
IMPRESSION: Cholelithiasis with 6.6 cm masslike soft tissue density involving
the gallbladder and adjacent hepatic parenchyma, highly suspicious
for primary gallbladder carcinoma.

Mild porta hepatis lymphadenopathy, suspicious for metastatic
disease.

Other small hepatic cysts noted, however no liver metastases
identified.

Mild diffuse biliary ductal dilatation, with 6 mm calculus in distal
common bile duct.

## 2020-06-28 ENCOUNTER — Other Ambulatory Visit: Payer: Self-pay | Admitting: Internal Medicine

## 2020-06-28 NOTE — Telephone Encounter (Signed)
Please refill as per office routine med refill policy (all routine meds refilled for 3 mo or monthly per pt preference up to one year from last visit, then month to month grace period for 3 mo, then further med refills will have to be denied)  

## 2020-07-01 ENCOUNTER — Ambulatory Visit (INDEPENDENT_AMBULATORY_CARE_PROVIDER_SITE_OTHER): Payer: MEDICARE | Admitting: Internal Medicine

## 2020-07-01 ENCOUNTER — Encounter (INDEPENDENT_AMBULATORY_CARE_PROVIDER_SITE_OTHER): Payer: Self-pay

## 2020-07-01 ENCOUNTER — Other Ambulatory Visit: Payer: Self-pay | Admitting: Internal Medicine

## 2020-07-01 ENCOUNTER — Encounter: Payer: Self-pay | Admitting: Internal Medicine

## 2020-07-01 ENCOUNTER — Other Ambulatory Visit: Payer: Self-pay

## 2020-07-01 VITALS — BP 132/74 | HR 94 | Temp 98.3°F | Ht 72.0 in | Wt 213.0 lb

## 2020-07-01 DIAGNOSIS — I10 Essential (primary) hypertension: Secondary | ICD-10-CM

## 2020-07-01 DIAGNOSIS — R972 Elevated prostate specific antigen [PSA]: Secondary | ICD-10-CM

## 2020-07-01 DIAGNOSIS — E785 Hyperlipidemia, unspecified: Secondary | ICD-10-CM

## 2020-07-01 DIAGNOSIS — R739 Hyperglycemia, unspecified: Secondary | ICD-10-CM

## 2020-07-01 NOTE — Patient Instructions (Addendum)
You had the flu shot today  Please remember to have the booster covid shot - [email or web address], or [email or web address] or similar  Your A1c was OK today  Please continue to monitor your BP at home or the pharmacy, with the goal to be at least less than 140/90  Please continue all other medications as before, and refills have been done if requested.  Please have the pharmacy call with any other refills you may need.  Please continue your efforts at being more active, low cholesterol diet, and weight control..  Please keep your appointments with your specialists as you may have planned  Please make an Appointment to return in 6 months, or sooner if needed

## 2020-07-01 NOTE — Progress Notes (Signed)
Subjective:    Patient ID: Marcus Boyd, male    DOB: 1946/03/25, 74 y.o.   MRN: [id number]  HPI   Here to f/u; overall doing ok,  Pt denies chest pain, increasing sob or doe, wheezing, orthopnea, PND, increased LE swelling, palpitations, dizziness or syncope.  Pt denies new neurological symptoms such as new headache, or facial or extremity weakness or numbness.  Pt denies polydipsia, polyuria, or low sugar episode.  Pt states overall good compliance with meds, mostly trying to follow appropriate diet, with wt overall stable,  but little exercise however.   BP at pharmacy average about 132/74.   Past Medical History:  Diagnosis Date  . Abnormal CT scan/MRI, gallbladder ? cancer 04/21/2019  . Atrial fibrillation, new onset ([place]) 11/16/2011  . ERECTILE DYSFUNCTION 04/10/2007  . Exertional dyspnea    "related to my heart problems"  . Flutter-fibrillation   . Hx of adenomatous colonic polyps 02/20/2019  . Hyperlipemia 11/14/2010  . HYPERTENSION 04/10/2007   Past Surgical History:  Procedure Laterality Date  . ATRIAL FLUTTER ABLATION N/A 03/12/2012   Procedure: ATRIAL FLUTTER ABLATION;  Surgeon: Evans Lance, MD;  Location: [facility] CATH LAB;  Service: Cardiovascular;  Laterality: N/A;  . COLONOSCOPY  04/22/2008  . POLYPECTOMY    . RADIOFREQUENCY ABLATION  03/12/12    reports that he quit smoking about 31 years ago. His smoking use included cigarettes. He has a 20.00 pack-year smoking history. He has never used smokeless tobacco. He reports previous alcohol use. He reports previous drug use. Drug: Marijuana. family history includes Cancer in his father; Hypertension in his mother and sister. No Known Allergies Current Outpatient Medications on File Prior to Visit  Medication Sig Dispense Refill  . amLODipine (NORVASC) 10 MG tablet Take 1 tablet (10 mg total) by mouth daily. 90 tablet 3  . aspirin 81 MG tablet Take 81 mg by mouth daily.    Marland Kitchen atorvastatin (LIPITOR) 10 MG tablet Take 1 tablet (10 mg  total) by mouth daily at 6 PM. Annual appt due in May w/labs must see provider for future refills 90 tablet 0  . ibuprofen (ADVIL,MOTRIN) 200 MG tablet Take 200 mg by mouth every 6 (six) hours as needed for moderate pain.    Marland Kitchen lisinopril (ZESTRIL) 40 MG tablet SMARTSIG:1 Tablet(s) By Mouth Daily    . metoprolol succinate (TOPROL-XL) 100 MG 24 hr tablet TAKE 1 TABLET BY MOUTH EVERY DAY WITH OR IMMEDIATELY AFTER FOOD 90 tablet 3  . Multiple Vitamin (MULTIVITAMIN) tablet Take 1 tablet by mouth daily.      . potassium chloride (K-DUR) 10 MEQ tablet TAKE 1 TABLET BY MOUTH EVERY DAY 90 tablet 1   No current facility-administered medications on file prior to visit.   Review of Systems All otherwise neg per pt    Objective:   Physical Exam BP 132/74   Pulse 94   Temp 98.3 F (36.8 C) (Oral)   Ht 6' (1.829 m)   Wt 213 lb (96.6 kg)   SpO2 98%   BMI 28.89 kg/m  VS noted,  Constitutional: Pt appears in NAD HENT: Head: NCAT.  Right Ear: External ear normal.  Left Ear: External ear normal.  Eyes: . Pupils are equal, round, and reactive to light. Conjunctivae and EOM are normal Nose: without d/c or deformity Neck: Neck supple. Gross normal ROM Cardiovascular: Normal rate and regular rhythm.   Pulmonary/Chest: Effort normal and breath sounds without rales or wheezing.  Abd:  Soft, NT, ND, +  BS, no organomegaly Neurological: Pt is alert. At baseline orientation, motor grossly intact Skin: Skin is warm. No rashes, other new lesions, no LE edema Psychiatric: Pt behavior is normal without agitation  All otherwise neg per pt Lab Results  Component Value Date   WBC 9.9 01/01/2020   HGB 13.7 01/01/2020   HCT 41.2 01/01/2020   PLT 260.0 01/01/2020   GLUCOSE 87 01/01/2020   CHOL 137 01/01/2020   TRIG 229.0 (H) 01/01/2020   HDL 31.40 (L) 01/01/2020   LDLDIRECT 75.0 01/01/2020   LDLCALC 62 12/26/2018   ALT 14 01/01/2020   AST 18 01/01/2020   NA 140 01/01/2020   K 3.3 (L) 01/01/2020   CL  101 01/01/2020   CREATININE 1.17 01/01/2020   BUN 15 01/01/2020   CO2 35 (H) 01/01/2020   TSH 0.53 01/01/2020   PSA 2.32 01/01/2020   INR 1.6 (H) 04/04/2012   HGBA1C 5.9 01/01/2020      Assessment & Plan:

## 2020-07-01 NOTE — Assessment & Plan Note (Signed)
Lab Results  Component Value Date   PSA 2.32 01/01/2020   PSA 4.08 (H) 01/30/2019   PSA 5.04 (H) 12/26/2018

## 2020-07-02 ENCOUNTER — Encounter: Payer: Self-pay | Admitting: Internal Medicine

## 2020-07-02 NOTE — Assessment & Plan Note (Signed)
stable overall by history and exam, recent data reviewed with pt, and pt to continue medical treatment as before,  to f/u any worsening symptoms or concerns  

## 2020-07-02 NOTE — Assessment & Plan Note (Addendum)

## 2020-08-26 DIAGNOSIS — H52223 Regular astigmatism, bilateral: Secondary | ICD-10-CM | POA: Diagnosis not present

## 2020-08-26 DIAGNOSIS — H2513 Age-related nuclear cataract, bilateral: Secondary | ICD-10-CM | POA: Diagnosis not present

## 2020-08-26 DIAGNOSIS — H35033 Hypertensive retinopathy, bilateral: Secondary | ICD-10-CM | POA: Diagnosis not present

## 2020-08-26 DIAGNOSIS — H524 Presbyopia: Secondary | ICD-10-CM | POA: Diagnosis not present

## 2020-08-26 DIAGNOSIS — H5203 Hypermetropia, bilateral: Secondary | ICD-10-CM | POA: Diagnosis not present

## 2020-09-22 ENCOUNTER — Other Ambulatory Visit: Payer: Self-pay | Admitting: Internal Medicine

## 2020-09-22 NOTE — Telephone Encounter (Signed)
Please refill as per office routine med refill policy (all routine meds refilled for 3 mo or monthly per pt preference up to one year from last visit, then month to month grace period for 3 mo, then further med refills will have to be denied)  

## 2020-09-30 ENCOUNTER — Other Ambulatory Visit: Payer: Self-pay | Admitting: Internal Medicine

## 2020-09-30 NOTE — Telephone Encounter (Signed)
Please refill as per office routine med refill policy (all routine meds refilled for 3 mo or monthly per pt preference up to one year from last visit, then month to month grace period for 3 mo, then further med refills will have to be denied)  

## 2020-12-26 ENCOUNTER — Other Ambulatory Visit: Payer: Self-pay | Admitting: Internal Medicine

## 2020-12-26 NOTE — Telephone Encounter (Signed)
Please refill as per office routine med refill policy (all routine meds refilled for 3 mo or monthly per pt preference up to one year from last visit, then month to month grace period for 3 mo, then further med refills will have to be denied)  

## 2020-12-29 ENCOUNTER — Other Ambulatory Visit: Payer: Self-pay

## 2020-12-30 ENCOUNTER — Encounter: Payer: Self-pay | Admitting: Internal Medicine

## 2020-12-30 ENCOUNTER — Ambulatory Visit (INDEPENDENT_AMBULATORY_CARE_PROVIDER_SITE_OTHER): Payer: MEDICARE | Admitting: Internal Medicine

## 2020-12-30 VITALS — BP 138/76 | HR 78 | Temp 98.4°F | Ht 72.0 in | Wt 202.0 lb

## 2020-12-30 DIAGNOSIS — Z0001 Encounter for general adult medical examination with abnormal findings: Secondary | ICD-10-CM

## 2020-12-30 DIAGNOSIS — E7800 Pure hypercholesterolemia, unspecified: Secondary | ICD-10-CM

## 2020-12-30 DIAGNOSIS — I10 Essential (primary) hypertension: Secondary | ICD-10-CM

## 2020-12-30 DIAGNOSIS — E559 Vitamin D deficiency, unspecified: Secondary | ICD-10-CM

## 2020-12-30 DIAGNOSIS — Z0000 Encounter for general adult medical examination without abnormal findings: Secondary | ICD-10-CM

## 2020-12-30 DIAGNOSIS — R739 Hyperglycemia, unspecified: Secondary | ICD-10-CM

## 2020-12-30 LAB — URINALYSIS, ROUTINE W REFLEX MICROSCOPIC
Bilirubin Urine: NEGATIVE
Hgb urine dipstick: NEGATIVE
Ketones, ur: NEGATIVE
Leukocytes,Ua: NEGATIVE
Nitrite: NEGATIVE
Specific Gravity, Urine: 1.02 (ref 1.000–1.030)
Total Protein, Urine: NEGATIVE
Urine Glucose: NEGATIVE
Urobilinogen, UA: 0.2 (ref 0.0–1.0)
pH: 6.5 (ref 5.0–8.0)

## 2020-12-30 LAB — HEPATIC FUNCTION PANEL
ALT: 11 U/L (ref 0–53)
AST: 17 U/L (ref 0–37)
Albumin: 3.8 g/dL (ref 3.5–5.2)
Alkaline Phosphatase: 51 U/L (ref 39–117)
Bilirubin, Direct: 0.1 mg/dL (ref 0.0–0.3)
Total Bilirubin: 0.3 mg/dL (ref 0.2–1.2)
Total Protein: 6.5 g/dL (ref 6.0–8.3)

## 2020-12-30 LAB — BASIC METABOLIC PANEL WITH GFR
BUN: 14 mg/dL (ref 6–23)
CO2: 35 meq/L — ABNORMAL HIGH (ref 19–32)
Calcium: 9.6 mg/dL (ref 8.4–10.5)
Chloride: 104 meq/L (ref 96–112)
Creatinine, Ser: 1.23 mg/dL (ref 0.40–1.50)
GFR: 58.24 mL/min — ABNORMAL LOW
Glucose, Bld: 93 mg/dL (ref 70–99)
Potassium: 4.4 meq/L (ref 3.5–5.1)
Sodium: 145 meq/L (ref 135–145)

## 2020-12-30 LAB — LIPID PANEL
Cholesterol: 189 mg/dL (ref 0–200)
HDL: 43 mg/dL (ref >39.00)
LDL Cholesterol: 131 mg/dL — ABNORMAL HIGH (ref 0–99)
NonHDL: 145.98
Total CHOL/HDL Ratio: 4
Triglycerides: 74 mg/dL (ref 0.0–149.0)
VLDL: 14.8 mg/dL (ref 0.0–40.0)

## 2020-12-30 LAB — CBC WITH DIFFERENTIAL/PLATELET
Basophils Absolute: 0 10*3/uL (ref 0.0–0.1)
Basophils Relative: 0.2 % (ref 0.0–3.0)
Eosinophils Absolute: 0.1 10*3/uL (ref 0.0–0.7)
Eosinophils Relative: 1.4 % (ref 0.0–5.0)
HCT: 41.5 % (ref 39.0–52.0)
Hemoglobin: 13.8 g/dL (ref 13.0–17.0)
Lymphocytes Relative: 18.9 % (ref 12.0–46.0)
Lymphs Abs: 1.6 10*3/uL (ref 0.7–4.0)
MCHC: 33.3 g/dL (ref 30.0–36.0)
MCV: 87.4 fl (ref 78.0–100.0)
Monocytes Absolute: 0.7 10*3/uL (ref 0.1–1.0)
Monocytes Relative: 7.9 % (ref 3.0–12.0)
Neutro Abs: 5.9 10*3/uL (ref 1.4–7.7)
Neutrophils Relative %: 71.6 % (ref 43.0–77.0)
Platelets: 267 10*3/uL (ref 150.0–400.0)
RBC: 4.75 Mil/uL (ref 4.22–5.81)
RDW: 14.1 % (ref 11.5–15.5)
WBC: 8.3 10*3/uL (ref 4.0–10.5)

## 2020-12-30 LAB — VITAMIN D 25 HYDROXY (VIT D DEFICIENCY, FRACTURES): VITD: 85.6 ng/mL (ref 30.00–100.00)

## 2020-12-30 LAB — HEMOGLOBIN A1C: Hgb A1c MFr Bld: 5.9 % (ref 4.6–6.5)

## 2020-12-30 LAB — TSH: TSH: 0.59 u[IU]/mL (ref 0.35–4.50)

## 2020-12-30 LAB — PSA: PSA: 2.93 ng/mL (ref 0.10–4.00)

## 2020-12-30 MED ORDER — ATORVASTATIN CALCIUM 10 MG PO TABS: 10.0000 mg | ORAL_TABLET | Freq: Every day | ORAL | 3 refills | Status: DC

## 2020-12-30 MED ORDER — LISINOPRIL 40 MG PO TABS: ORAL_TABLET | ORAL | 3 refills | Status: DC

## 2020-12-30 MED ORDER — POTASSIUM CHLORIDE ER 10 MEQ PO TBCR: 10.0000 meq | EXTENDED_RELEASE_TABLET | Freq: Every day | ORAL | 3 refills | Status: DC

## 2020-12-30 MED ORDER — HYDROCHLOROTHIAZIDE 25 MG PO TABS
1.0000 | ORAL_TABLET | Freq: Every day | ORAL | 3 refills | Status: DC
Start: 2020-12-30 — End: 2021-12-25

## 2020-12-30 MED ORDER — AMLODIPINE BESYLATE 10 MG PO TABS: 10.0000 mg | ORAL_TABLET | Freq: Every day | ORAL | 3 refills | Status: DC

## 2020-12-30 MED ORDER — METOPROLOL SUCCINATE ER 100 MG PO TB24: ORAL_TABLET | ORAL | 3 refills | Status: DC

## 2020-12-30 NOTE — Patient Instructions (Signed)

## 2020-12-30 NOTE — Progress Notes (Signed)
Patient ID: Marcus Boyd, male   DOB: Aug 27, 1945, 75 y.o.   MRN: [id number]         Chief Complaint:: wellness exam and Follow-up  hyperglycemia, htn       HPI:  Marcus Boyd is a 75 y.o. male here for wellness exam; declines second covid booster o/w up to date with preventive referrals and immunizations.                        Also Pt denies chest pain, increased sob or doe, wheezing, orthopnea, PND, increased LE swelling, palpitations, dizziness or syncope.   Pt denies polydipsia, polyuria, or new focal neuro s/s.   Pt denies fever, night sweats, loss of appetite, or other constitutional symptoms  No other new complaints    Has been able to lose several lbs recently but may not have been following lower cholesterol diet as well.     Wt Readings from Last 3 Encounters:  12/30/20 202 lb (91.6 kg)  07/01/20 213 lb (96.6 kg)  01/01/20 219 lb (99.3 kg)   BP Readings from Last 3 Encounters:  12/30/20 138/76  07/02/20 132/74  01/01/20 (!) 160/86   Immunization History  Administered Date(s) Administered  . Influenza-Unspecified 06/13/2018  . PFIZER(Purple Top)SARS-COV-2 Vaccination 09/18/2019, 10/09/2019  . Pneumococcal Conjugate-13 11/27/2013  . Pneumococcal Polysaccharide-23 12/14/2016  . Td 08/13/2004  . Tdap 12/09/2015  . Varicella Zoster Immune Globulin 11/14/2010  There are no preventive care reminders to display for this patient.    Past Medical History:  Diagnosis Date  . Abnormal CT scan/MRI, gallbladder ? cancer 04/21/2019  . Atrial fibrillation, new onset ([place]) 11/16/2011  . ERECTILE DYSFUNCTION 04/10/2007  . Exertional dyspnea    "related to my heart problems"  . Flutter-fibrillation   . Hx of adenomatous colonic polyps 02/20/2019  . Hyperlipemia 11/14/2010  . HYPERTENSION 04/10/2007   Past Surgical History:  Procedure Laterality Date  . ATRIAL FLUTTER ABLATION N/A 03/12/2012   Procedure: ATRIAL FLUTTER ABLATION;  Surgeon: Evans Lance, MD;  Location: [facility] CATH LAB;   Service: Cardiovascular;  Laterality: N/A;  . COLONOSCOPY  04/22/2008  . POLYPECTOMY    . RADIOFREQUENCY ABLATION  03/12/12    reports that he quit smoking about 32 years ago. His smoking use included cigarettes. He has a 20.00 pack-year smoking history. He has never used smokeless tobacco. He reports previous alcohol use. He reports previous drug use. Drug: Marijuana. family history includes Cancer in his father; Hypertension in his mother and sister. No Known Allergies Current Outpatient Medications on File Prior to Visit  Medication Sig Dispense Refill  . aspirin 81 MG tablet Take 81 mg by mouth daily.    Marland Kitchen ibuprofen (ADVIL,MOTRIN) 200 MG tablet Take 200 mg by mouth every 6 (six) hours as needed for moderate pain.    . Multiple Vitamin (MULTIVITAMIN) tablet Take 1 tablet by mouth daily.     No current facility-administered medications on file prior to visit.        ROS:  All others reviewed and negative.  Objective        PE:  BP 138/76 (BP Location: Right Arm, Patient Position: Sitting, Cuff Size: Large)   Pulse 78   Temp 98.4 F (36.9 C) (Oral)   Ht 6' (1.829 m)   Wt 202 lb (91.6 kg)   SpO2 98%   BMI 27.40 kg/m                 Constitutional:  Pt appears in NAD               HENT: Head: NCAT.                Right Ear: External ear normal.                 Left Ear: External ear normal.                Eyes: . Pupils are equal, round, and reactive to light. Conjunctivae and EOM are normal               Nose: without d/c or deformity               Neck: Neck supple. Gross normal ROM               Cardiovascular: Normal rate and regular rhythm.                 Pulmonary/Chest: Effort normal and breath sounds without rales or wheezing.                Abd:  Soft, NT, ND, + BS, no organomegaly               Neurological: Pt is alert. At baseline orientation, motor grossly intact               Skin: Skin is warm. No rashes, no other new lesions, LE edema - none                Psychiatric: Pt behavior is normal without agitation   Micro: none  Cardiac tracings I have personally interpreted today:  none  Pertinent Radiological findings (summarize): none   Lab Results  Component Value Date   WBC 8.3 12/30/2020   HGB 13.8 12/30/2020   HCT 41.5 12/30/2020   PLT 267.0 12/30/2020   GLUCOSE 93 12/30/2020   CHOL 189 12/30/2020   TRIG 74.0 12/30/2020   HDL 43.00 12/30/2020   LDLDIRECT 75.0 01/01/2020   LDLCALC 131 (H) 12/30/2020   ALT 11 12/30/2020   AST 17 12/30/2020   NA 145 12/30/2020   K 4.4 12/30/2020   CL 104 12/30/2020   CREATININE 1.23 12/30/2020   BUN 14 12/30/2020   CO2 35 (H) 12/30/2020   TSH 0.59 12/30/2020   PSA 2.93 12/30/2020   INR 1.6 (H) 04/04/2012   HGBA1C 5.9 12/30/2020   Assessment/Plan:  Marcus Boyd is a 75 y.o. Black or African American [2] male with  has a past medical history of Abnormal CT scan/MRI, gallbladder ? cancer (04/21/2019), Atrial fibrillation, new onset ([place]) (11/16/2011), ERECTILE DYSFUNCTION (04/10/2007), Exertional dyspnea, Flutter-fibrillation, adenomatous colonic polyps (02/20/2019), Hyperlipemia (11/14/2010), and HYPERTENSION (04/10/2007).  Encounter for well adult exam with abnormal findings Age and sex appropriate education and counseling updated with regular exercise and diet Referrals for preventative services - none needed Immunizations addressed - declines second covid booster Smoking counseling  - none needed Evidence for depression or other mood disorder - none significant Most recent labs reviewed. I have personally reviewed and have noted: 1) the patient's medical and social history 2) The patient's current medications and supplements 3) The patient's height, weight, and BMI have been recorded in the chart   Vitamin D deficiency Last vitamin D Lab Results  Component Value Date   VD25OH 85.60 12/30/2020   Stable, cont oral replacement   Hyperlipidemia Lab Results  Component Value Date    LDLCALC 131 (H) 12/30/2020  Stable, pt to continue current statin lipitor 10, but work on lower chol diet as well as may not have been as rigourous as in the past   Hyperglycemia Lab Results  Component Value Date   HGBA1C 5.9 12/30/2020   Stable, pt to continue current medical treatment  - diet   Essential hypertension BP Readings from Last 3 Encounters:  12/30/20 138/76  07/02/20 132/74  01/01/20 (!) 160/86   Stable, pt to continue medical treatment amlodipine, lisinopril, hct, toprol xl   Current Outpatient Medications (Cardiovascular):  .  amLODipine (NORVASC) 10 MG tablet, Take 1 tablet (10 mg total) by mouth daily. Marland Kitchen  atorvastatin (LIPITOR) 10 MG tablet, Take 1 tablet (10 mg total) by mouth daily at 6 PM. .  hydrochlorothiazide (HYDRODIURIL) 25 MG tablet, Take 1 tablet (25 mg total) by mouth daily. Marland Kitchen  lisinopril (ZESTRIL) 40 MG tablet, TAKE 1 TABLET BY MOUTH EVERY DAY .  metoprolol succinate (TOPROL-XL) 100 MG 24 hr tablet, Take with or immediately following a meal.   Current Outpatient Medications (Analgesics):  .  aspirin 81 MG tablet, Take 81 mg by mouth daily. Marland Kitchen  ibuprofen (ADVIL,MOTRIN) 200 MG tablet, Take 200 mg by mouth every 6 (six) hours as needed for moderate pain.   Current Outpatient Medications (Other):  Marland Kitchen  Multiple Vitamin (MULTIVITAMIN) tablet, Take 1 tablet by mouth daily. .  potassium chloride (KLOR-CON) 10 MEQ tablet, Take 1 tablet (10 mEq total) by mouth daily.   Followup: Return in about 6 months (around 07/02/2021).  Cathlean Cower, MD 12/31/2020 10:43 AM [facility]

## 2020-12-31 ENCOUNTER — Encounter: Payer: Self-pay | Admitting: Internal Medicine

## 2020-12-31 NOTE — Assessment & Plan Note (Signed)
Lab Results  Component Value Date   HGBA1C 5.9 12/30/2020   Stable, pt to continue current medical treatment  - diet  

## 2020-12-31 NOTE — Assessment & Plan Note (Signed)
Last vitamin D °Lab Results  °Component Value Date  ° VD25OH 85.60 12/30/2020  ° °Stable, cont oral replacement ° °

## 2020-12-31 NOTE — Assessment & Plan Note (Signed)
BP Readings from Last 3 Encounters:  12/30/20 138/76  07/02/20 132/74  01/01/20 (!) 160/86   Stable, pt to continue medical treatment amlodipine, lisinopril, hct, toprol xl   Current Outpatient Medications (Cardiovascular):  .  amLODipine (NORVASC) 10 MG tablet, Take 1 tablet (10 mg total) by mouth daily. Marland Kitchen  atorvastatin (LIPITOR) 10 MG tablet, Take 1 tablet (10 mg total) by mouth daily at 6 PM. .  hydrochlorothiazide (HYDRODIURIL) 25 MG tablet, Take 1 tablet (25 mg total) by mouth daily. Marland Kitchen  lisinopril (ZESTRIL) 40 MG tablet, TAKE 1 TABLET BY MOUTH EVERY DAY .  metoprolol succinate (TOPROL-XL) 100 MG 24 hr tablet, Take with or immediately following a meal.   Current Outpatient Medications (Analgesics):  .  aspirin 81 MG tablet, Take 81 mg by mouth daily. Marland Kitchen  ibuprofen (ADVIL,MOTRIN) 200 MG tablet, Take 200 mg by mouth every 6 (six) hours as needed for moderate pain.   Current Outpatient Medications (Other):  Marland Kitchen  Multiple Vitamin (MULTIVITAMIN) tablet, Take 1 tablet by mouth daily. .  potassium chloride (KLOR-CON) 10 MEQ tablet, Take 1 tablet (10 mEq total) by mouth daily.

## 2020-12-31 NOTE — Assessment & Plan Note (Signed)
Age and sex appropriate education and counseling updated with regular exercise and diet Referrals for preventative services - none needed Immunizations addressed - declines second covid booster Smoking counseling  - none needed Evidence for depression or other mood disorder - none significant Most recent labs reviewed. I have personally reviewed and have noted: 1) the patient's medical and social history 2) The patient's current medications and supplements 3) The patient's height, weight, and BMI have been recorded in the chart

## 2020-12-31 NOTE — Assessment & Plan Note (Addendum)
Lab Results  Component Value Date   LDLCALC 131 (H) 12/30/2020   Stable, pt to continue current statin lipitor 10, but work on lower chol diet as well as may not have been as rigourous as in the past

## 2021-01-27 ENCOUNTER — Ambulatory Visit: Payer: MEDICARE

## 2021-01-27 ENCOUNTER — Ambulatory Visit (INDEPENDENT_AMBULATORY_CARE_PROVIDER_SITE_OTHER): Payer: MEDICARE

## 2021-01-27 ENCOUNTER — Other Ambulatory Visit: Payer: Self-pay

## 2021-01-27 VITALS — BP 128/70 | HR 83 | Temp 98.3°F | Ht 72.0 in | Wt 197.0 lb

## 2021-01-27 DIAGNOSIS — Z0000 Encounter for general adult medical examination without abnormal findings: Secondary | ICD-10-CM | POA: Diagnosis not present

## 2021-01-27 NOTE — Progress Notes (Signed)
Subjective:   Marcus Boyd is a 75 y.o. male who presents for Medicare Annual/Subsequent preventive examination.  Review of Systems     Cardiac Risk Factors include: advanced age (>46men, >30 women);dyslipidemia;family history of premature cardiovascular disease;hypertension;male gender     Objective:    Today's Vitals   01/27/21 1324  BP: 128/70  Pulse: 83  Temp: 98.3 F (36.8 C)  TempSrc: Temporal  SpO2: 98%  Weight: 197 lb (89.4 kg)  Height: 6' (1.829 m)  PainSc: 0-No pain   Body mass index is 26.72 kg/m.  Advanced Directives 01/27/2021 01/01/2020 12/25/2018 12/20/2017 09/11/2017 09/08/2017 06/09/2016  Does Patient Have a Medical Advance Directive? No Yes No No No No No  Does patient want to make changes to medical advance directive? - No - Patient declined - - - - -  Would patient like information on creating a medical advance directive? No - Patient declined - Yes (ED - Information included in AVS) No - Patient declined - No - Patient declined No - patient declined information    Current Medications (verified) Outpatient Encounter Medications as of 01/27/2021  Medication Sig   amLODipine (NORVASC) 10 MG tablet Take 1 tablet (10 mg total) by mouth daily.   aspirin 81 MG tablet Take 81 mg by mouth daily.   atorvastatin (LIPITOR) 10 MG tablet Take 1 tablet (10 mg total) by mouth daily at 6 PM.   hydrochlorothiazide (HYDRODIURIL) 25 MG tablet Take 1 tablet (25 mg total) by mouth daily.   ibuprofen (ADVIL,MOTRIN) 200 MG tablet Take 200 mg by mouth every 6 (six) hours as needed for moderate pain.   lisinopril (ZESTRIL) 40 MG tablet TAKE 1 TABLET BY MOUTH EVERY DAY   metoprolol succinate (TOPROL-XL) 100 MG 24 hr tablet Take with or immediately following a meal.   Multiple Vitamin (MULTIVITAMIN) tablet Take 1 tablet by mouth daily.   potassium chloride (KLOR-CON) 10 MEQ tablet Take 1 tablet (10 mEq total) by mouth daily.   No facility-administered encounter medications on  file as of 01/27/2021.    Allergies (verified) Patient has no known allergies.   History: Past Medical History:  Diagnosis Date   Abnormal CT scan/MRI, gallbladder ? cancer 04/21/2019   Atrial fibrillation, new onset ([place]) 11/16/2011   ERECTILE DYSFUNCTION 04/10/2007   Exertional dyspnea    "related to my heart problems"   Flutter-fibrillation    Hx of adenomatous colonic polyps 02/20/2019   Hyperlipemia 11/14/2010   HYPERTENSION 04/10/2007   Past Surgical History:  Procedure Laterality Date   ATRIAL FLUTTER ABLATION N/A 03/12/2012   Procedure: ATRIAL FLUTTER ABLATION;  Surgeon: Evans Lance, MD;  Location: [facility] CATH LAB;  Service: Cardiovascular;  Laterality: N/A;   COLONOSCOPY  04/22/2008   POLYPECTOMY     RADIOFREQUENCY ABLATION  03/12/12   Family History  Problem Relation Age of Onset   Hypertension Mother    Cancer Father        unsure what kind of cancer   Hypertension Sister        twin   Colon cancer Neg Hx    Colon polyps Neg Hx    Esophageal cancer Neg Hx    Rectal cancer Neg Hx    Stomach cancer Neg Hx    Social History   Socioeconomic History   Marital status: Legally Separated    Spouse name: Not on file   Number of children: 4   Years of education: 12   Highest education level: High school graduate  Occupational  History   Occupation: Truck driver/ retired    Fish farm manager: Airline pilot  Tobacco Use   Smoking status: Former    Packs/day: 1.00    Years: 20.00    Pack years: 20.00    Types: Cigarettes    Quit date: 08/13/1988    Years since quitting: 32.4   Smokeless tobacco: Never   Tobacco comments:    03/12/12 "quit smoking in the 1990's"  Vaping Use   Vaping Use: Never used  Substance and Sexual Activity   Alcohol use: Not Currently    Comment: occ.wine or beer per pt-none recently    Drug use: Not Currently    Types: Marijuana    Comment: 03/12/12 "last marijuana was oversees; 1970's"   Sexual activity: Yes  Other Topics Concern   Not on file   Social History Narrative   Not on file   Social Determinants of Health   Financial Resource Strain: Low Risk    Difficulty of Paying Living Expenses: Not hard at all  Food Insecurity: No Food Insecurity   Worried About Charity fundraiser in the Last Year: Never true   [place] in the Last Year: Never true  Transportation Needs: No Transportation Needs   Lack of Transportation (Medical): No   Lack of Transportation (Non-Medical): No  Physical Activity: Sufficiently Active   Days of Exercise per Week: 7 days   Minutes of Exercise per Session: 30 min  Stress: No Stress Concern Present   Feeling of Stress : Not at all  Social Connections: Unknown   Frequency of Communication with Friends and Family: More than three times a week   Frequency of Social Gatherings with Friends and Family: More than three times a week   Attends Religious Services: Not on file   Active Member of Clubs or Organizations: No   Attends Archivist Meetings: Not on file   Marital Status: Divorced    Tobacco Counseling Counseling given: Not Answered Tobacco comments: 03/12/12 "quit smoking in the 1990's"   Clinical Intake:  Pre-visit preparation completed: Yes  Pain : No/denies pain Pain Score: 0-No pain     BMI - recorded: 26.72 Nutritional Status: BMI 25 -29 Overweight Nutritional Risks: None Diabetes: No  How often do you need to have someone help you when you read instructions, pamphlets, or other written materials from your doctor or pharmacy?: 1 - Never What is the last grade level you completed in school?: High School Graduate  Diabetic? no  Interpreter Needed?: No  Information entered by :: Lisette Abu, LPN   Activities of Daily Living In your present state of health, do you have any difficulty performing the following activities: 01/27/2021  Hearing? N  Vision? N  Difficulty concentrating or making decisions? N  Walking or climbing stairs? N  Dressing or  bathing? N  Doing errands, shopping? N  Preparing Food and eating ? N  Using the Toilet? N  In the past six months, have you accidently leaked urine? N  Do you have problems with loss of bowel control? N  Managing your Medications? N  Managing your Finances? N  Housekeeping or managing your Housekeeping? N  Some recent data might be hidden    Patient Care Team: Biagio Borg, MD as PCP - General Lovena Le Champ Mungo, MD as Consulting Physician (Cardiology) Gatha Mayer, MD as Consulting Physician (Gastroenterology) Coralie Keens, MD as Consulting Physician (General Surgery) Ladell Pier, MD as Consulting Physician (Oncology)  Indicate  any recent Medical Services you may have received from other than [facility] providers in the past year (date may be approximate).     Assessment:   This is a routine wellness examination for [place].  Hearing/Vision screen No results found.  Dietary issues and exercise activities discussed: Current Exercise Habits: Home exercise routine (Also still working as a Copy.), Type of exercise: walking;treadmill;stretching;strength training/weights (Goes to gym 1-2 times a week.), Time (Minutes): 30, Frequency (Times/Week): 7, Weekly Exercise (Minutes/Week): 210, Intensity: Moderate, Exercise limited by: None identified   Goals Addressed             This Visit's Progress    Patient Stated       Continue to live day by day; continue to be independent and active.       Depression Screen PHQ 2/9 Scores 01/27/2021 12/30/2020 12/30/2020 12/30/2020 01/01/2020 01/01/2020 01/01/2020  PHQ - 2 Score 0 0 0 0 0 0 0  PHQ- 9 Score - - - - - - -    Fall Risk Fall Risk  01/27/2021 12/30/2020 01/01/2020 01/01/2020 12/25/2018  Falls in the past year? 0 0 0 0 0  Number falls in past yr: 0 0 - 0 0  Injury with Fall? 0 0 - 0 -  Risk for fall due to : No Fall Risks - - No Fall Risks -  Follow up Falls evaluation completed - - Falls evaluation  completed;Education provided;Falls prevention discussed -    FALL RISK PREVENTION PERTAINING TO THE HOME:  Any stairs in or around the home? No  If so, are there any without handrails? No  Home free of loose throw rugs in walkways, pet beds, electrical cords, etc? Yes  Adequate lighting in your home to reduce risk of falls? Yes   ASSISTIVE DEVICES UTILIZED TO PREVENT FALLS:  Life alert? No  Use of a cane, walker or w/c? No  Grab bars in the bathroom? No  Shower chair or bench in shower? No  Elevated toilet seat or a handicapped toilet? No   TIMED UP AND GO:  Was the test performed? Yes .  Length of time to ambulate 10 feet: 6 sec.   Gait steady and fast without use of assistive device  Cognitive Function: Normal cognitive status assessed by direct observation by this Nurse Health Advisor. No abnormalities found.   MMSE - Mini Mental State Exam 12/20/2017  Orientation to time 5  Orientation to Place 5  Registration 3  Attention/ Calculation 5  Recall 2  Language- name 2 objects 2  Language- repeat 1  Language- follow 3 step command 3  Language- read & follow direction 1  Write a sentence 1  Copy design 1  Total score 29        Immunizations Immunization History  Administered Date(s) Administered   Influenza-Unspecified 06/13/2018, 06/13/2020   PFIZER(Purple Top)SARS-COV-2 Vaccination 09/18/2019, 10/09/2019, 09/05/2020   Pneumococcal Conjugate-13 11/27/2013   Pneumococcal Polysaccharide-23 12/14/2016   Td 08/13/2004   Tdap 12/09/2015   Varicella Zoster Immune Globulin 11/14/2010    TDAP status: Up to date  Flu Vaccine status: Up to date  Pneumococcal vaccine status: Up to date  Covid-19 vaccine status: Completed vaccines  Qualifies for Shingles Vaccine? Yes   Zostavax completed Yes   Shingrix Completed?: No.    Education has been provided regarding the importance of this vaccine. Patient has been advised to call insurance company to determine out of  pocket expense if they have not yet received this  vaccine. Advised may also receive vaccine at local pharmacy or Health Dept. Verbalized acceptance and understanding.  Screening Tests Health Maintenance  Topic Date Due   Zoster Vaccines- Shingrix (1 of 2) Never done   COVID-19 Vaccine (4 - Booster for Pfizer series) 12/04/2020   INFLUENZA VACCINE  03/13/2021   TETANUS/TDAP  12/08/2025   COLONOSCOPY (Pts 45-83yrs Insurance coverage will need to be confirmed)  02/19/2029   Hepatitis C Screening  Completed   PNA vac Low Risk Adult  Completed   HPV VACCINES  Aged Out    Health Maintenance  Health Maintenance Due  Topic Date Due   Zoster Vaccines- Shingrix (1 of 2) Never done   COVID-19 Vaccine (4 - Booster for Pfizer series) 12/04/2020    Colorectal cancer screening: Type of screening: Colonoscopy. Completed 02/20/2019. Repeat every 10 years  Lung Cancer Screening: (Low Dose CT Chest recommended if Age 92-80 years, 30 pack-year currently smoking OR have quit w/in 15years.) does not qualify.   Lung Cancer Screening Referral: no  Additional Screening:  Hepatitis C Screening: does qualify; Completed yes  Vision Screening: Recommended annual ophthalmology exams for early detection of glaucoma and other disorders of the eye. Is the patient up to date with their annual eye exam?  Yes  Who is the provider or what is the name of the office in which the patient attends annual eye exams? Ritesh Poudyal, OD. If pt is not established with a provider, would they like to be referred to a provider to establish care? No .   Dental Screening: Recommended annual dental exams for proper oral hygiene  Community Resource Referral / Chronic Care Management: CRR required this visit?  No   CCM required this visit?  No      Plan:     I have personally reviewed and noted the following in the patient's chart:   Medical and social history Use of alcohol, tobacco or illicit drugs  Current  medications and supplements including opioid prescriptions. Patient is not currently taking opioid prescriptions. Functional ability and status Nutritional status Physical activity Advanced directives List of other physicians Hospitalizations, surgeries, and ER visits in previous 12 months Vitals Screenings to include cognitive, depression, and falls Referrals and appointments  In addition, I have reviewed and discussed with patient certain preventive protocols, quality metrics, and best practice recommendations. A written personalized care plan for preventive services as well as general preventive health recommendations were provided to patient.     Sheral Flow, LPN   [id number]   Nurse Notes: n/a

## 2021-01-27 NOTE — Patient Instructions (Addendum)
Marcus Boyd , Thank you for taking time to come for your Medicare Wellness Visit. I appreciate your ongoing commitment to your health goals. Please review the following plan we discussed and let me know if I can assist you in the future.   Screening recommendations/referrals: Colonoscopy: 02/20/2019; due every 10 years (due: 02/19/2029) Recommended yearly ophthalmology/optometry visit for glaucoma screening and checkup Recommended yearly dental visit for hygiene and checkup  Vaccinations: Influenza vaccine:06/13/2018; due every Fall season (due: 03/13/2021) Pneumococcal vaccine: 11/27/2013, 12/14/2016 (completed) Tdap vaccine: 12/09/2015; due every 10 years (due: 12/08/2025) Shingles vaccine: never done   Covid-19: 09/18/2019, 09/19/2019, 09/05/2020 (second booster vaccine available)  Advanced directives: Advance directive discussed with you today. Even though you declined this today please call our office should you change your mind and we can give you the proper paperwork for you to fill out.  Conditions/risks identified: Continue to live day by day; continue to be independent and active.  Next appointment: Scheduled for 02/02/2022 at 9:45 pm.  Preventive Care 32 Years and Older, Male Preventive care refers to lifestyle choices and visits with your health care provider that can promote health and wellness. What does preventive care include? A yearly physical exam. This is also called an annual well check. Dental exams once or twice a year. Routine eye exams. Ask your health care provider how often you should have your eyes checked. Personal lifestyle choices, including: Daily care of your teeth and gums. Regular physical activity. Eating a healthy diet. Avoiding tobacco and drug use. Limiting alcohol use. Practicing safe sex. Taking low doses of aspirin every day. Taking vitamin and mineral supplements as recommended by your health care provider. What happens during an annual well check? The  services and screenings done by your health care provider during your annual well check will depend on your age, overall health, lifestyle risk factors, and family history of disease. Counseling  Your health care provider may ask you questions about your: Alcohol use. Tobacco use. Drug use. Emotional well-being. Home and relationship well-being. Sexual activity. Eating habits. History of falls. Memory and ability to understand (cognition). Work and work Statistician. Screening  You may have the following tests or measurements: Height, weight, and BMI. Blood pressure. Lipid and cholesterol levels. These may be checked every 5 years, or more frequently if you are over 109 years old. Skin check. Lung cancer screening. You may have this screening every year starting at age 37 if you have a 30-pack-year history of smoking and currently smoke or have quit within the past 15 years. Fecal occult blood test (FOBT) of the stool. You may have this test every year starting at age 56. Flexible sigmoidoscopy or colonoscopy. You may have a sigmoidoscopy every 5 years or a colonoscopy every 10 years starting at age 43. Prostate cancer screening. Recommendations will vary depending on your family history and other risks. Hepatitis C blood test. Hepatitis B blood test. Sexually transmitted disease (STD) testing. Diabetes screening. This is done by checking your blood sugar (glucose) after you have not eaten for a while (fasting). You may have this done every 1-3 years. Abdominal aortic aneurysm (AAA) screening. You may need this if you are a current or former smoker. Osteoporosis. You may be screened starting at age 80 if you are at high risk. Talk with your health care provider about your test results, treatment options, and if necessary, the need for more tests. Vaccines  Your health care provider may recommend certain vaccines, such as: Influenza vaccine. This  is recommended every year. Tetanus,  diphtheria, and acellular pertussis (Tdap, Td) vaccine. You may need a Td booster every 10 years. Zoster vaccine. You may need this after age 37. Pneumococcal 13-valent conjugate (PCV13) vaccine. One dose is recommended after age 39. Pneumococcal polysaccharide (PPSV23) vaccine. One dose is recommended after age 94. Talk to your health care provider about which screenings and vaccines you need and how often you need them. This information is not intended to replace advice given to you by your health care provider. Make sure you discuss any questions you have with your health care provider. Document Released: 08/26/2015 Document Revised: 04/18/2016 Document Reviewed: 05/31/2015 Elsevier Interactive Patient Education  2017 [place] Prevention in the Home Falls can cause injuries. They can happen to people of all ages. There are many things you can do to make your home safe and to help prevent falls. What can I do on the outside of my home? Regularly fix the edges of walkways and driveways and fix any cracks. Remove anything that might make you trip as you walk through a door, such as a raised step or threshold. Trim any bushes or trees on the path to your home. Use bright outdoor lighting. Clear any walking paths of anything that might make someone trip, such as rocks or tools. Regularly check to see if handrails are loose or broken. Make sure that both sides of any steps have handrails. Any raised decks and porches should have guardrails on the edges. Have any leaves, snow, or ice cleared regularly. Use sand or salt on walking paths during winter. Clean up any spills in your garage right away. This includes oil or grease spills. What can I do in the bathroom? Use night lights. Install grab bars by the toilet and in the tub and shower. Do not use towel bars as grab bars. Use non-skid mats or decals in the tub or shower. If you need to sit down in the shower, use a plastic,  non-slip stool. Keep the floor dry. Clean up any water that spills on the floor as soon as it happens. Remove soap buildup in the tub or shower regularly. Attach bath mats securely with double-sided non-slip rug tape. Do not have throw rugs and other things on the floor that can make you trip. What can I do in the bedroom? Use night lights. Make sure that you have a light by your bed that is easy to reach. Do not use any sheets or blankets that are too big for your bed. They should not hang down onto the floor. Have a firm chair that has side arms. You can use this for support while you get dressed. Do not have throw rugs and other things on the floor that can make you trip. What can I do in the kitchen? Clean up any spills right away. Avoid walking on wet floors. Keep items that you use a lot in easy-to-reach places. If you need to reach something above you, use a strong step stool that has a grab bar. Keep electrical cords out of the way. Do not use floor polish or wax that makes floors slippery. If you must use wax, use non-skid floor wax. Do not have throw rugs and other things on the floor that can make you trip. What can I do with my stairs? Do not leave any items on the stairs. Make sure that there are handrails on both sides of the stairs and use them. Fix handrails that  are broken or loose. Make sure that handrails are as long as the stairways. Check any carpeting to make sure that it is firmly attached to the stairs. Fix any carpet that is loose or worn. Avoid having throw rugs at the top or bottom of the stairs. If you do have throw rugs, attach them to the floor with carpet tape. Make sure that you have a light switch at the top of the stairs and the bottom of the stairs. If you do not have them, ask someone to add them for you. What else can I do to help prevent falls? Wear shoes that: Do not have high heels. Have rubber bottoms. Are comfortable and fit you well. Are closed  at the toe. Do not wear sandals. If you use a stepladder: Make sure that it is fully opened. Do not climb a closed stepladder. Make sure that both sides of the stepladder are locked into place. Ask someone to hold it for you, if possible. Clearly mark and make sure that you can see: Any grab bars or handrails. First and last steps. Where the edge of each step is. Use tools that help you move around (mobility aids) if they are needed. These include: Canes. Walkers. Scooters. Crutches. Turn on the lights when you go into a dark area. Replace any light bulbs as soon as they burn out. Set up your furniture so you have a clear path. Avoid moving your furniture around. If any of your floors are uneven, fix them. If there are any pets around you, be aware of where they are. Review your medicines with your doctor. Some medicines can make you feel dizzy. This can increase your chance of falling. Ask your doctor what other things that you can do to help prevent falls. This information is not intended to replace advice given to you by your health care provider. Make sure you discuss any questions you have with your health care provider. Document Released: 05/26/2009 Document Revised: 01/05/2016 Document Reviewed: 09/03/2014 Elsevier Interactive Patient Education  2017 [employer].

## 2021-06-05 DIAGNOSIS — Z23 Encounter for immunization: Secondary | ICD-10-CM | POA: Diagnosis not present

## 2021-08-11 ENCOUNTER — Encounter: Payer: MEDICARE | Admitting: Internal Medicine

## 2021-08-25 ENCOUNTER — Telehealth: Payer: Self-pay

## 2021-08-25 ENCOUNTER — Encounter: Payer: Self-pay | Admitting: Internal Medicine

## 2021-08-25 ENCOUNTER — Other Ambulatory Visit: Payer: Self-pay

## 2021-08-25 ENCOUNTER — Ambulatory Visit (INDEPENDENT_AMBULATORY_CARE_PROVIDER_SITE_OTHER): Payer: MEDICARE | Admitting: Internal Medicine

## 2021-08-25 VITALS — BP 130/68 | HR 78 | Temp 98.1°F | Ht 72.0 in | Wt 217.0 lb

## 2021-08-25 DIAGNOSIS — R972 Elevated prostate specific antigen [PSA]: Secondary | ICD-10-CM | POA: Diagnosis not present

## 2021-08-25 DIAGNOSIS — E7800 Pure hypercholesterolemia, unspecified: Secondary | ICD-10-CM | POA: Diagnosis not present

## 2021-08-25 DIAGNOSIS — R739 Hyperglycemia, unspecified: Secondary | ICD-10-CM | POA: Diagnosis not present

## 2021-08-25 DIAGNOSIS — E559 Vitamin D deficiency, unspecified: Secondary | ICD-10-CM | POA: Diagnosis not present

## 2021-08-25 DIAGNOSIS — I10 Essential (primary) hypertension: Secondary | ICD-10-CM

## 2021-08-25 DIAGNOSIS — E538 Deficiency of other specified B group vitamins: Secondary | ICD-10-CM | POA: Diagnosis not present

## 2021-08-25 LAB — VITAMIN B12: Vitamin B-12: 1316 pg/mL — ABNORMAL HIGH (ref 211–911)

## 2021-08-25 LAB — CBC WITH DIFFERENTIAL/PLATELET
Basophils Absolute: 0 10*3/uL (ref 0.0–0.1)
Basophils Relative: 0.3 % (ref 0.0–3.0)
Eosinophils Absolute: 0.1 10*3/uL (ref 0.0–0.7)
Eosinophils Relative: 1.3 % (ref 0.0–5.0)
HCT: 41.6 % (ref 39.0–52.0)
Hemoglobin: 13.5 g/dL (ref 13.0–17.0)
Lymphocytes Relative: 30.2 % (ref 12.0–46.0)
Lymphs Abs: 2 10*3/uL (ref 0.7–4.0)
MCHC: 32.4 g/dL (ref 30.0–36.0)
MCV: 86.9 fl (ref 78.0–100.0)
Monocytes Absolute: 0.8 10*3/uL (ref 0.1–1.0)
Monocytes Relative: 11.9 % (ref 3.0–12.0)
Neutro Abs: 3.7 10*3/uL (ref 1.4–7.7)
Neutrophils Relative %: 56.3 % (ref 43.0–77.0)
Platelets: 272 10*3/uL (ref 150.0–400.0)
RBC: 4.78 Mil/uL (ref 4.22–5.81)
RDW: 14.8 % (ref 11.5–15.5)
WBC: 6.6 10*3/uL (ref 4.0–10.5)

## 2021-08-25 LAB — URINALYSIS, ROUTINE W REFLEX MICROSCOPIC
Bilirubin Urine: NEGATIVE
Hgb urine dipstick: NEGATIVE
Ketones, ur: NEGATIVE
Leukocytes,Ua: NEGATIVE
Nitrite: NEGATIVE
Specific Gravity, Urine: 1.02 (ref 1.000–1.030)
Total Protein, Urine: NEGATIVE
Urine Glucose: NEGATIVE
Urobilinogen, UA: 0.2 (ref 0.0–1.0)
pH: 6 (ref 5.0–8.0)

## 2021-08-25 LAB — BASIC METABOLIC PANEL
BUN: 13 mg/dL (ref 6–23)
CO2: 33 mEq/L — ABNORMAL HIGH (ref 19–32)
Calcium: 9.2 mg/dL (ref 8.4–10.5)
Chloride: 104 mEq/L (ref 96–112)
Creatinine, Ser: 1.08 mg/dL (ref 0.40–1.50)
GFR: 67.76 mL/min (ref >60.00)
Glucose, Bld: 65 mg/dL — ABNORMAL LOW (ref 70–99)
Potassium: 3.3 mEq/L — ABNORMAL LOW (ref 3.5–5.1)
Sodium: 144 mEq/L (ref 135–145)

## 2021-08-25 LAB — HEPATIC FUNCTION PANEL
ALT: 13 U/L (ref 0–53)
AST: 16 U/L (ref 0–37)
Albumin: 4 g/dL (ref 3.5–5.2)
Alkaline Phosphatase: 50 U/L (ref 39–117)
Bilirubin, Direct: 0.1 mg/dL (ref 0.0–0.3)
Total Bilirubin: 0.5 mg/dL (ref 0.2–1.2)
Total Protein: 6.8 g/dL (ref 6.0–8.3)

## 2021-08-25 LAB — LIPID PANEL
Cholesterol: 144 mg/dL (ref 0–200)
HDL: 41.1 mg/dL (ref >39.00)
LDL Cholesterol: 86 mg/dL (ref 0–99)
NonHDL: 102.84
Total CHOL/HDL Ratio: 4
Triglycerides: 83 mg/dL (ref 0.0–149.0)
VLDL: 16.6 mg/dL (ref 0.0–40.0)

## 2021-08-25 LAB — VITAMIN D 25 HYDROXY (VIT D DEFICIENCY, FRACTURES): VITD: >120 ng/mL

## 2021-08-25 LAB — PSA: PSA: 3.25 ng/mL (ref 0.10–4.00)

## 2021-08-25 LAB — HEMOGLOBIN A1C: Hgb A1c MFr Bld: 5.8 % (ref 4.6–6.5)

## 2021-08-25 LAB — TSH: TSH: 0.85 u[IU]/mL (ref 0.35–5.50)

## 2021-08-25 NOTE — Assessment & Plan Note (Signed)
Last vitamin D Lab Results  Component Value Date   VD25OH 85.60 12/30/2020   Stable, cont oral replacement

## 2021-08-25 NOTE — Patient Instructions (Signed)

## 2021-08-25 NOTE — Telephone Encounter (Signed)
CRITICAL VALUE STICKER  CRITICAL VALUE: Vitamin D >120  RECEIVER (on-site recipient of call): Lovena Le A. Alroy Dust, CMA  DATE & TIME NOTIFIED: 08/25/2021 at 1431  MESSENGER (representative from lab): Hope  MD NOTIFIED: yes  TIME OF NOTIFICATION: 7703  RESPONSE: waiting response

## 2021-08-25 NOTE — Assessment & Plan Note (Signed)
. °  Lab Results  Component Value Date   LDLCALC 131 (H) 12/30/2020   Uncontrolled, for lipitor 10 qd and f/u lipids today

## 2021-08-25 NOTE — Progress Notes (Signed)
Patient ID: Marcus Boyd, male   DOB: [birth year], 76 y.o.   MRN: [id number]         Chief Complaint:: yearly exam       HPI:  Marcus Boyd is a 76 y.o. male Pt denies chest pain, increased sob or doe, wheezing, orthopnea, PND, increased LE swelling, palpitations, dizziness or syncope.   Pt denies polydipsia, polyuria, or new focal neuro s/s.   Pt denies fever, wt loss, night sweats, loss of appetite, or other constitutional symptoms   Taking vit d.  No new complaints.  Trying to follow lower chol diet  Denies urinary symptoms such as dysuria, frequency, urgency, flank pain, hematuria or n/v, fever, chills.   Wt Readings from Last 3 Encounters:  08/25/21 217 lb (98.4 kg)  01/27/21 197 lb (89.4 kg)  12/30/20 202 lb (91.6 kg)   BP Readings from Last 3 Encounters:  08/25/21 130/68  01/27/21 128/70  12/30/20 138/76   Immunization History  Administered Date(s) Administered   Influenza, High Dose Seasonal PF 06/05/2021   Influenza-Unspecified 06/13/2018, 06/13/2020   PFIZER Comirnaty(Gray Top)Covid-19 Tri-Sucrose Vaccine 09/05/2020   PFIZER(Purple Top)SARS-COV-2 Vaccination 09/18/2019, 10/09/2019, 09/05/2020   Pfizer Covid-19 Vaccine Bivalent Booster 73yrs & up 06/05/2021   Pneumococcal Conjugate-13 11/27/2013   Pneumococcal Polysaccharide-23 12/14/2016   Td 08/13/2004   Tdap 12/09/2015   Varicella Zoster Immune Globulin 11/14/2010   There are no preventive care reminders to display for this patient.     Past Medical History:  Diagnosis Date   Abnormal CT scan/MRI, gallbladder ? cancer 04/21/2019   Atrial fibrillation, new onset ([place]) 11/16/2011   ERECTILE DYSFUNCTION 04/10/2007   Exertional dyspnea    "related to my heart problems"   Flutter-fibrillation    Hx of adenomatous colonic polyps 02/20/2019   Hyperlipemia 11/14/2010   HYPERTENSION 04/10/2007   Past Surgical History:  Procedure Laterality Date   ATRIAL FLUTTER ABLATION N/A 03/12/2012   Procedure: ATRIAL FLUTTER  ABLATION;  Surgeon: Evans Lance, MD;  Location: [facility] CATH LAB;  Service: Cardiovascular;  Laterality: N/A;   COLONOSCOPY  04/22/2008   POLYPECTOMY     RADIOFREQUENCY ABLATION  03/12/12    reports that he quit smoking about 33 years ago. His smoking use included cigarettes. He has a 20.00 pack-year smoking history. He has never used smokeless tobacco. He reports that he does not currently use alcohol. He reports that he does not currently use drugs after having used the following drugs: Marijuana. family history includes Cancer in his father; Hypertension in his mother and sister. No Known Allergies Current Outpatient Medications on File Prior to Visit  Medication Sig Dispense Refill   amLODipine (NORVASC) 10 MG tablet Take 1 tablet (10 mg total) by mouth daily. 90 tablet 3   aspirin 81 MG tablet Take 81 mg by mouth daily.     atorvastatin (LIPITOR) 10 MG tablet Take 1 tablet (10 mg total) by mouth daily at 6 PM. 90 tablet 3   hydrochlorothiazide (HYDRODIURIL) 25 MG tablet Take 1 tablet (25 mg total) by mouth daily. 90 tablet 3   ibuprofen (ADVIL,MOTRIN) 200 MG tablet Take 200 mg by mouth every 6 (six) hours as needed for moderate pain.     lisinopril (ZESTRIL) 40 MG tablet TAKE 1 TABLET BY MOUTH EVERY DAY 90 tablet 3   metoprolol succinate (TOPROL-XL) 100 MG 24 hr tablet Take with or immediately following a meal. 90 tablet 3   Multiple Vitamin (MULTIVITAMIN) tablet Take 1 tablet by mouth daily.  potassium chloride (KLOR-CON) 10 MEQ tablet Take 1 tablet (10 mEq total) by mouth daily. 90 tablet 3   No current facility-administered medications on file prior to visit.        ROS:  All others reviewed and negative.  Objective        PE:  BP 130/68 (BP Location: Right Arm, Patient Position: Sitting, Cuff Size: Large)    Pulse 78    Temp 98.1 F (36.7 C) (Oral)    Ht 6' (1.829 m)    Wt 217 lb (98.4 kg)    SpO2 93%    BMI 29.43 kg/m                 Constitutional: Pt appears in NAD                HENT: Head: NCAT.                Right Ear: External ear normal.                 Left Ear: External ear normal.                Eyes: . Pupils are equal, round, and reactive to light. Conjunctivae and EOM are normal               Nose: without d/c or deformity               Neck: Neck supple. Gross normal ROM               Cardiovascular: Normal rate and regular rhythm.                 Pulmonary/Chest: Effort normal and breath sounds without rales or wheezing.                Abd:  Soft, NT, ND, + BS, no organomegaly               Neurological: Pt is alert. At baseline orientation, motor grossly intact               Skin: Skin is warm. No rashes, no other new lesions, LE edema - none               Psychiatric: Pt behavior is normal without agitation   Micro: none  Cardiac tracings I have personally interpreted today:  none  Pertinent Radiological findings (summarize): none   Lab Results  Component Value Date   WBC 6.6 08/25/2021   HGB 13.5 08/25/2021   HCT 41.6 08/25/2021   PLT 272.0 08/25/2021   GLUCOSE 65 (L) 08/25/2021   CHOL 144 08/25/2021   TRIG 83.0 08/25/2021   HDL 41.10 08/25/2021   LDLDIRECT 75.0 01/01/2020   LDLCALC 86 08/25/2021   ALT 13 08/25/2021   AST 16 08/25/2021   NA 144 08/25/2021   K 3.3 (L) 08/25/2021   CL 104 08/25/2021   CREATININE 1.08 08/25/2021   BUN 13 08/25/2021   CO2 33 (H) 08/25/2021   TSH 0.85 08/25/2021   PSA 3.25 08/25/2021   INR 1.6 (H) 04/04/2012   HGBA1C 5.8 08/25/2021   Assessment/Plan:  Marcus Boyd is a 76 y.o. Black or African American [2] male with  has a past medical history of Abnormal CT scan/MRI, gallbladder ? cancer (04/21/2019), Atrial fibrillation, new onset ([place]) (11/16/2011), ERECTILE DYSFUNCTION (04/10/2007), Exertional dyspnea, Flutter-fibrillation, adenomatous colonic polyps (02/20/2019), Hyperlipemia (11/14/2010), and HYPERTENSION (04/10/2007).  Vitamin D deficiency Last vitamin D Lab  Results  Component Value Date    VD25OH 85.60 12/30/2020   Stable, cont oral replacement   Hyperlipidemia . Lab Results  Component Value Date   LDLCALC 131 (H) 12/30/2020   Uncontrolled, for lipitor 10 qd and f/u lipids today   Hyperglycemia Lab Results  Component Value Date   HGBA1C 5.8 08/25/2021   Stable, pt to continue current medical treatment  - diet   Essential hypertension BP Readings from Last 3 Encounters:  08/25/21 130/68  01/27/21 128/70  12/30/20 138/76   Stable, pt to continue medical treatment amlodipine, hct, lisinopril   Elevated PSA Asympt, for f/u lab  Followup: Return in about 6 months (around 02/22/2022).  Cathlean Cower, MD 08/26/2021 7:25 PM [place]

## 2021-08-26 ENCOUNTER — Encounter: Payer: Self-pay | Admitting: Internal Medicine

## 2021-08-26 NOTE — Assessment & Plan Note (Signed)
Asympt, for f/u lab

## 2021-08-26 NOTE — Assessment & Plan Note (Signed)
BP Readings from Last 3 Encounters:  08/25/21 130/68  01/27/21 128/70  12/30/20 138/76   Stable, pt to continue medical treatment amlodipine, hct, lisinopril

## 2021-08-26 NOTE — Assessment & Plan Note (Signed)
Lab Results  Component Value Date   HGBA1C 5.8 08/25/2021   Stable, pt to continue current medical treatment  - diet

## 2021-12-25 ENCOUNTER — Telehealth: Payer: Self-pay | Admitting: Internal Medicine

## 2021-12-25 MED ORDER — HYDROCHLOROTHIAZIDE 25 MG PO TABS: 25.0000 mg | ORAL_TABLET | Freq: Every day | ORAL | 3 refills | Status: DC

## 2021-12-25 MED ORDER — METOPROLOL SUCCINATE ER 100 MG PO TB24: ORAL_TABLET | ORAL | 3 refills | Status: DC

## 2021-12-25 MED ORDER — AMLODIPINE BESYLATE 10 MG PO TABS: 10.0000 mg | ORAL_TABLET | Freq: Every day | ORAL | 3 refills | Status: DC

## 2021-12-25 MED ORDER — POTASSIUM CHLORIDE ER 10 MEQ PO TBCR: 10.0000 meq | EXTENDED_RELEASE_TABLET | Freq: Every day | ORAL | 3 refills | Status: DC

## 2021-12-25 MED ORDER — LISINOPRIL 40 MG PO TABS: ORAL_TABLET | ORAL | 3 refills | Status: DC

## 2021-12-25 NOTE — Telephone Encounter (Signed)
1.Medication Requested: ?hydrochlorothiazide (HYDRODIURIL) 25 MG tablet ?amLODipine (NORVASC) 10 MG tablet ?metoprolol succinate (TOPROL-XL) 100 MG 24 hr tablet ?potassium chloride (KLOR-CON) 10 MEQ tablet ?lisinopril (ZESTRIL) 40 MG tablet ? ?2. Pharmacy (Name, [facility]): ?[facility] [place], [address] [facility] [address] Phone:  [phone]  ?Fax:  [phone]  ?  ? ?3. On Med List: yes ? ?4. Last Visit with PCP: ? ?5. Next visit date with PCP: ? ? ?Agent: Please be advised that RX refills may take up to 3 business days. We ask that you follow-up with your pharmacy.  ?

## 2022-02-02 ENCOUNTER — Ambulatory Visit (INDEPENDENT_AMBULATORY_CARE_PROVIDER_SITE_OTHER): Payer: MEDICARE

## 2022-02-02 VITALS — BP 128/70 | HR 70 | Temp 97.9°F | Ht 72.0 in | Wt 223.8 lb

## 2022-02-02 DIAGNOSIS — Z0000 Encounter for general adult medical examination without abnormal findings: Secondary | ICD-10-CM

## 2022-02-23 ENCOUNTER — Ambulatory Visit: Payer: MEDICARE | Admitting: Internal Medicine

## 2022-03-02 ENCOUNTER — Ambulatory Visit: Payer: MEDICARE | Admitting: Internal Medicine

## 2022-03-08 ENCOUNTER — Encounter: Payer: Self-pay | Admitting: Internal Medicine

## 2022-03-08 ENCOUNTER — Ambulatory Visit (INDEPENDENT_AMBULATORY_CARE_PROVIDER_SITE_OTHER): Payer: MEDICARE | Admitting: Internal Medicine

## 2022-03-08 VITALS — BP 132/70 | HR 66 | Temp 98.1°F | Ht 72.0 in | Wt 224.0 lb

## 2022-03-08 DIAGNOSIS — E538 Deficiency of other specified B group vitamins: Secondary | ICD-10-CM

## 2022-03-08 DIAGNOSIS — E7800 Pure hypercholesterolemia, unspecified: Secondary | ICD-10-CM | POA: Diagnosis not present

## 2022-03-08 DIAGNOSIS — R739 Hyperglycemia, unspecified: Secondary | ICD-10-CM

## 2022-03-08 DIAGNOSIS — I10 Essential (primary) hypertension: Secondary | ICD-10-CM | POA: Diagnosis not present

## 2022-03-08 DIAGNOSIS — Z0001 Encounter for general adult medical examination with abnormal findings: Secondary | ICD-10-CM | POA: Diagnosis not present

## 2022-03-08 DIAGNOSIS — E559 Vitamin D deficiency, unspecified: Secondary | ICD-10-CM

## 2022-03-08 DIAGNOSIS — Z125 Encounter for screening for malignant neoplasm of prostate: Secondary | ICD-10-CM | POA: Diagnosis not present

## 2022-03-08 LAB — HEPATIC FUNCTION PANEL
ALT: 17 U/L (ref 0–53)
AST: 18 U/L (ref 0–37)
Albumin: 4.2 g/dL (ref 3.5–5.2)
Alkaline Phosphatase: 59 U/L (ref 39–117)
Bilirubin, Direct: 0.1 mg/dL (ref 0.0–0.3)
Total Bilirubin: 0.4 mg/dL (ref 0.2–1.2)
Total Protein: 7.2 g/dL (ref 6.0–8.3)

## 2022-03-08 LAB — URINALYSIS, ROUTINE W REFLEX MICROSCOPIC
Bilirubin Urine: NEGATIVE
Hgb urine dipstick: NEGATIVE
Ketones, ur: NEGATIVE
Leukocytes,Ua: NEGATIVE
Nitrite: NEGATIVE
RBC / HPF: NONE SEEN (ref 0–?)
Specific Gravity, Urine: 1.02 (ref 1.000–1.030)
Total Protein, Urine: NEGATIVE
Urine Glucose: NEGATIVE
Urobilinogen, UA: 0.2 (ref 0.0–1.0)
WBC, UA: NONE SEEN (ref 0–?)
pH: 6 (ref 5.0–8.0)

## 2022-03-08 LAB — BASIC METABOLIC PANEL
BUN: 11 mg/dL (ref 6–23)
CO2: 33 mEq/L — ABNORMAL HIGH (ref 19–32)
Calcium: 9.2 mg/dL (ref 8.4–10.5)
Chloride: 102 mEq/L (ref 96–112)
Creatinine, Ser: 1.08 mg/dL (ref 0.40–1.50)
GFR: 67.51 mL/min (ref >60.00)
Glucose, Bld: 92 mg/dL (ref 70–99)
Potassium: 3.5 mEq/L (ref 3.5–5.1)
Sodium: 142 mEq/L (ref 135–145)

## 2022-03-08 LAB — CBC WITH DIFFERENTIAL/PLATELET
Basophils Absolute: 0 10*3/uL (ref 0.0–0.1)
Basophils Relative: 0.4 % (ref 0.0–3.0)
Eosinophils Absolute: 0.2 10*3/uL (ref 0.0–0.7)
Eosinophils Relative: 3 % (ref 0.0–5.0)
HCT: 42.4 % (ref 39.0–52.0)
Hemoglobin: 14.3 g/dL (ref 13.0–17.0)
Lymphocytes Relative: 27.1 % (ref 12.0–46.0)
Lymphs Abs: 2 10*3/uL (ref 0.7–4.0)
MCHC: 33.7 g/dL (ref 30.0–36.0)
MCV: 87.2 fl (ref 78.0–100.0)
Monocytes Absolute: 0.7 10*3/uL (ref 0.1–1.0)
Monocytes Relative: 9.4 % (ref 3.0–12.0)
Neutro Abs: 4.3 10*3/uL (ref 1.4–7.7)
Neutrophils Relative %: 60.1 % (ref 43.0–77.0)
Platelets: 268 10*3/uL (ref 150.0–400.0)
RBC: 4.86 Mil/uL (ref 4.22–5.81)
RDW: 14.9 % (ref 11.5–15.5)
WBC: 7.2 10*3/uL (ref 4.0–10.5)

## 2022-03-08 LAB — LIPID PANEL
Cholesterol: 138 mg/dL (ref 0–200)
HDL: 35 mg/dL — ABNORMAL LOW (ref >39.00)
LDL Cholesterol: 72 mg/dL (ref 0–99)
NonHDL: 103.15
Total CHOL/HDL Ratio: 4
Triglycerides: 154 mg/dL — ABNORMAL HIGH (ref 0.0–149.0)
VLDL: 30.8 mg/dL (ref 0.0–40.0)

## 2022-03-08 LAB — PSA: PSA: 2.44 ng/mL (ref 0.10–4.00)

## 2022-03-08 LAB — VITAMIN D 25 HYDROXY (VIT D DEFICIENCY, FRACTURES): VITD: 86.25 ng/mL (ref 30.00–100.00)

## 2022-03-08 LAB — VITAMIN B12: Vitamin B-12: >1500 pg/mL — ABNORMAL HIGH (ref 211–911)

## 2022-03-08 LAB — HEMOGLOBIN A1C: Hgb A1c MFr Bld: 5.9 % (ref 4.6–6.5)

## 2022-03-08 LAB — TSH: TSH: 0.64 u[IU]/mL (ref 0.35–5.50)

## 2022-03-08 MED ORDER — ATORVASTATIN CALCIUM 20 MG PO TABS: 20.0000 mg | ORAL_TABLET | Freq: Every day | ORAL | 3 refills | Status: DC

## 2022-03-08 NOTE — Progress Notes (Signed)
Patient ID: Marcus Boyd, male   DOB: 08-19-1945, 76 y.o.   MRN: [id number]         Chief Complaint:: wellness exam and hld, low vit d, htn, hyperglycemia       HPI:  Marcus Boyd is a 76 y.o. male here for wellness exam; declines covid booster, o/w up to date                        Also Pt denies chest pain, increased sob or doe, wheezing, orthopnea, PND, increased LE swelling, palpitations, dizziness or syncope.   Pt denies polydipsia, polyuria, or new focal neuro s/s.    Pt denies fever, wt loss, night sweats, loss of appetite, or other constitutional symptoms  god compliance with meds   Wt Readings from Last 3 Encounters:  03/08/22 224 lb (101.6 kg)  02/02/22 223 lb 12.8 oz (101.5 kg)  08/25/21 217 lb (98.4 kg)   BP Readings from Last 3 Encounters:  03/08/22 132/70  02/02/22 128/70  08/25/21 130/68   Immunization History  Administered Date(s) Administered   Influenza, High Dose Seasonal PF 06/05/2021   Influenza-Unspecified 06/13/2018, 06/13/2020   PFIZER Comirnaty(Gray Top)Covid-19 Tri-Sucrose Vaccine 09/05/2020   PFIZER(Purple Top)SARS-COV-2 Vaccination 09/18/2019, 10/09/2019, 09/05/2020   Pfizer Covid-19 Vaccine Bivalent Booster 2yr & up 06/05/2021   Pneumococcal Conjugate-13 11/27/2013   Pneumococcal Polysaccharide-23 12/14/2016   Td 08/13/2004   Tdap 12/09/2015   Varicella Zoster Immune Globulin 11/14/2010  There are no preventive care reminders to display for this patient.    Past Medical History:  Diagnosis Date   Abnormal CT scan/MRI, gallbladder ? cancer 04/21/2019   [place], [place] 11/16/2011   ERECTILE DYSFUNCTION 04/10/2007   Exertional dyspnea    "related to my heart problems"   Flutter-fibrillation    Hx of adenomatous colonic polyps 02/20/2019   Hyperlipemia 11/14/2010   HYPERTENSION 04/10/2007   Past Surgical History:  Procedure Laterality Date   ATRIAL FLUTTER ABLATION N/A 03/12/2012   Procedure: ATRIAL FLUTTER ABLATION;   Surgeon: GEvans Lance MD;  [facility];  Service: Cardiovascular;  Laterality: N/A;   COLONOSCOPY  04/22/2008   POLYPECTOMY     RADIOFREQUENCY ABLATION  03/12/12    reports that he quit smoking about 33 years ago. His smoking use included cigarettes. He has a 20.00 pack-year smoking history. He has never used smokeless tobacco. He reports that he does not currently use alcohol. He reports that he does not currently use drugs after having used the following drugs: Marijuana. family history includes Cancer in his father; Hypertension in his mother and sister. No Known Allergies Current Outpatient Medications on File Prior to Visit  Medication Sig Dispense Refill   amLODipine (NORVASC) 10 MG tablet Take 1 tablet (10 mg total) by mouth daily. 90 tablet 3   aspirin 81 MG tablet Take 81 mg by mouth daily.     hydrochlorothiazide (HYDRODIURIL) 25 MG tablet Take 1 tablet (25 mg total) by mouth daily. 90 tablet 3   ibuprofen (ADVIL,MOTRIN) 200 MG tablet Take 200 mg by mouth every 6 (six) hours as needed for moderate pain.     lisinopril (ZESTRIL) 40 MG tablet TAKE 1 TABLET BY MOUTH EVERY DAY 90 tablet 3   metoprolol succinate (TOPROL-XL) 100 MG 24 hr tablet Take with or immediately following a meal. 90 tablet 3   Multiple Vitamin (MULTIVITAMIN) tablet Take 1 tablet by mouth daily.     potassium chloride (KLOR-CON) 10  MEQ tablet Take 1 tablet (10 mEq total) by mouth daily. 90 tablet 3   No current facility-administered medications on file prior to visit.        ROS:  All others reviewed and negative.  Objective        PE:  BP 132/70 (BP Location: Right Arm, Patient Position: Sitting, Cuff Size: Large)   Pulse 66   Temp 98.1 F (36.7 C) (Oral)   Ht 6' (1.829 m)   Wt 224 lb (101.6 kg)   SpO2 97%   BMI 30.38 kg/m                 Constitutional: Pt appears in NAD               HENT: Head: NCAT.                Right Ear: External ear normal.                 Left Ear: External ear  normal.                Eyes: . Pupils are equal, round, and reactive to light. Conjunctivae and EOM are normal               Nose: without d/c or deformity               Neck: Neck supple. Gross normal ROM               Cardiovascular: Normal rate and regular rhythm.                 Pulmonary/Chest: Effort normal and breath sounds without rales or wheezing.                Abd:  Soft, NT, ND, + BS, no organomegaly               Neurological: Pt is alert. At baseline orientation, motor grossly intact               Skin: Skin is warm. No rashes, no other new lesions, LE edema - none               Psychiatric: Pt behavior is normal without agitation   Micro: none  Cardiac tracings I have personally interpreted today:  none  Pertinent Radiological findings (summarize): none   Lab Results  Component Value Date   WBC 7.2 03/08/2022   HGB 14.3 03/08/2022   HCT 42.4 03/08/2022   PLT 268.0 03/08/2022   GLUCOSE 92 03/08/2022   CHOL 138 03/08/2022   TRIG 154.0 (H) 03/08/2022   HDL 35.00 (L) 03/08/2022   LDLDIRECT 75.0 01/01/2020   LDLCALC 72 03/08/2022   ALT 17 03/08/2022   AST 18 03/08/2022   NA 142 03/08/2022   K 3.5 03/08/2022   CL 102 03/08/2022   CREATININE 1.08 03/08/2022   BUN 11 03/08/2022   CO2 33 (H) 03/08/2022   TSH 0.64 03/08/2022   PSA 2.44 03/08/2022   INR 1.6 (H) 04/04/2012   HGBA1C 5.9 03/08/2022   Assessment/Plan:  Marcus Boyd is a 76 y.o. Black or African American [2] male with  has a past medical history of Abnormal CT scan/MRI, gallbladder ? cancer (04/21/2019), Atrial fibrillation, new onset ([place]) (11/16/2011), ERECTILE DYSFUNCTION (04/10/2007), Exertional dyspnea, Flutter-fibrillation, adenomatous colonic polyps (02/20/2019), Hyperlipemia (11/14/2010), and HYPERTENSION (04/10/2007).  Vitamin D deficiency Last vitamin D Lab Results  Component Value Date   VD25OH >120 08/25/2021  Stable, cont oral replacement   Hyperlipidemia Lab Results  Component Value  Date   LDLCALC 86 08/25/2021   uncontrolled, pt to increase statin lipitor to 20 mg   Encounter for well adult exam with abnormal findings Age and sex appropriate education and counseling updated with regular exercise and diet Referrals for preventative services - none needed Immunizations addressed - declines covid booster Smoking counseling  - none needed Evidence for depression or other mood disorder - none significant Most recent labs reviewed. I have personally reviewed and have noted: 1) the patient's medical and social history 2) The patient's current medications and supplements 3) The patient's height, weight, and BMI have been recorded in the chart   Hyperglycemia Lab Results  Component Value Date   HGBA1C 5.9 03/08/2022   Stable, pt to continue current medical treatment  - diet, wt control, excercise   Essential hypertension BP Readings from Last 3 Encounters:  03/08/22 132/70  02/02/22 128/70  08/25/21 130/68   Stable, pt to continue medical treatment norvasc 10 qd, hct 25 qd, zestril 40 qd  Followup: Return in about 6 months (around 09/08/2022).  Cathlean Cower, MD 03/10/2022 6:13 PM [facility]

## 2022-03-08 NOTE — Assessment & Plan Note (Addendum)
Lab Results  Component Value Date   LDLCALC 86 08/25/2021   uncontrolled, pt to increase statin lipitor to 20 mg

## 2022-03-08 NOTE — Progress Notes (Signed)
Printed letter and mailed to address on file..,lmb

## 2022-03-08 NOTE — Assessment & Plan Note (Signed)
Last vitamin D Lab Results  Component Value Date   VD25OH >120 08/25/2021   Stable, cont oral replacement

## 2022-03-08 NOTE — Patient Instructions (Signed)
Ok to increase the lipitor to 20 mg per day  Please continue all other medications as before, and refills have been done if requested.  Please have the pharmacy call with any other refills you may need.  Please continue your efforts at being more active, low cholesterol diet, and weight control.  You are otherwise up to date with prevention measures today.  Please keep your appointments with your specialists as you may have planned  Please go to the LAB at the blood drawing area for the tests to be done  You will be contacted by phone if any changes need to be made immediately.  Otherwise, you will receive a letter about your results with an explanation, but please check with MyChart first.  Please remember to sign up for MyChart if you have not done so, as this will be important to you in the future with finding out test results, communicating by private email, and scheduling acute appointments online when needed.  Please make an Appointment to return in 6 months, or sooner if needed

## 2022-03-10 ENCOUNTER — Encounter: Payer: Self-pay | Admitting: Internal Medicine

## 2022-03-10 NOTE — Assessment & Plan Note (Signed)
BP Readings from Last 3 Encounters:  03/08/22 132/70  02/02/22 128/70  08/25/21 130/68   Stable, pt to continue medical treatment norvasc 10 qd, hct 25 qd, zestril 40 qd

## 2022-03-10 NOTE — Assessment & Plan Note (Signed)
Lab Results  Component Value Date   HGBA1C 5.9 03/08/2022   Stable, pt to continue current medical treatment  - diet, wt control, excercise  

## 2022-03-10 NOTE — Assessment & Plan Note (Signed)

## 2022-09-07 ENCOUNTER — Encounter: Payer: Self-pay | Admitting: Internal Medicine

## 2022-09-07 ENCOUNTER — Ambulatory Visit (INDEPENDENT_AMBULATORY_CARE_PROVIDER_SITE_OTHER): Payer: Medicare Other | Admitting: Internal Medicine

## 2022-09-07 VITALS — BP 128/76 | HR 71 | Temp 97.7°F | Ht 72.0 in | Wt 229.0 lb

## 2022-09-07 DIAGNOSIS — R739 Hyperglycemia, unspecified: Secondary | ICD-10-CM

## 2022-09-07 DIAGNOSIS — R972 Elevated prostate specific antigen [PSA]: Secondary | ICD-10-CM

## 2022-09-07 DIAGNOSIS — E538 Deficiency of other specified B group vitamins: Secondary | ICD-10-CM | POA: Diagnosis not present

## 2022-09-07 DIAGNOSIS — Z23 Encounter for immunization: Secondary | ICD-10-CM

## 2022-09-07 DIAGNOSIS — I10 Essential (primary) hypertension: Secondary | ICD-10-CM | POA: Diagnosis not present

## 2022-09-07 DIAGNOSIS — E559 Vitamin D deficiency, unspecified: Secondary | ICD-10-CM | POA: Diagnosis not present

## 2022-09-07 DIAGNOSIS — Z0001 Encounter for general adult medical examination with abnormal findings: Secondary | ICD-10-CM | POA: Diagnosis not present

## 2022-09-07 DIAGNOSIS — E7800 Pure hypercholesterolemia, unspecified: Secondary | ICD-10-CM

## 2022-09-07 DIAGNOSIS — G479 Sleep disorder, unspecified: Secondary | ICD-10-CM

## 2022-09-07 LAB — HEPATIC FUNCTION PANEL
ALT: 19 U/L (ref 0–53)
AST: 20 U/L (ref 0–37)
Albumin: 4.2 g/dL (ref 3.5–5.2)
Alkaline Phosphatase: 64 U/L (ref 39–117)
Bilirubin, Direct: 0.1 mg/dL (ref 0.0–0.3)
Total Bilirubin: 0.3 mg/dL (ref 0.2–1.2)
Total Protein: 7.3 g/dL (ref 6.0–8.3)

## 2022-09-07 LAB — CBC WITH DIFFERENTIAL/PLATELET
Basophils Absolute: 0 10*3/uL (ref 0.0–0.1)
Basophils Relative: 0.6 % (ref 0.0–3.0)
Eosinophils Absolute: 0.2 10*3/uL (ref 0.0–0.7)
Eosinophils Relative: 1.9 % (ref 0.0–5.0)
HCT: 42.4 % (ref 39.0–52.0)
Hemoglobin: 14.5 g/dL (ref 13.0–17.0)
Lymphocytes Relative: 31.1 % (ref 12.0–46.0)
Lymphs Abs: 2.6 10*3/uL (ref 0.7–4.0)
MCHC: 34.2 g/dL (ref 30.0–36.0)
MCV: 88.5 fl (ref 78.0–100.0)
Monocytes Absolute: 0.7 10*3/uL (ref 0.1–1.0)
Monocytes Relative: 8.1 % (ref 3.0–12.0)
Neutro Abs: 4.9 10*3/uL (ref 1.4–7.7)
Neutrophils Relative %: 58.3 % (ref 43.0–77.0)
Platelets: 302 10*3/uL (ref 150.0–400.0)
RBC: 4.79 Mil/uL (ref 4.22–5.81)
RDW: 13.6 % (ref 11.5–15.5)
WBC: 8.4 10*3/uL (ref 4.0–10.5)

## 2022-09-07 LAB — URINALYSIS, ROUTINE W REFLEX MICROSCOPIC
Bilirubin Urine: NEGATIVE
Hgb urine dipstick: NEGATIVE
Ketones, ur: NEGATIVE
Leukocytes,Ua: NEGATIVE
Nitrite: NEGATIVE
RBC / HPF: NONE SEEN (ref 0–?)
Specific Gravity, Urine: 1.025 (ref 1.000–1.030)
Total Protein, Urine: NEGATIVE
Urine Glucose: NEGATIVE
Urobilinogen, UA: 0.2 (ref 0.0–1.0)
pH: 7 (ref 5.0–8.0)

## 2022-09-07 LAB — MICROALBUMIN / CREATININE URINE RATIO
Creatinine,U: 95.5 mg/dL
Microalb Creat Ratio: 0.8 mg/g (ref 0.0–30.0)
Microalb, Ur: 0.7 mg/dL (ref 0.0–1.9)

## 2022-09-07 LAB — LIPID PANEL
Cholesterol: 168 mg/dL (ref 0–200)
HDL: 33.3 mg/dL — ABNORMAL LOW (ref >39.00)
NonHDL: 134.69
Total CHOL/HDL Ratio: 5
Triglycerides: 220 mg/dL — ABNORMAL HIGH (ref 0.0–149.0)
VLDL: 44 mg/dL — ABNORMAL HIGH (ref 0.0–40.0)

## 2022-09-07 LAB — LDL CHOLESTEROL, DIRECT: Direct LDL: 88 mg/dL

## 2022-09-07 LAB — BASIC METABOLIC PANEL
BUN: 12 mg/dL (ref 6–23)
CO2: 32 mEq/L (ref 19–32)
Calcium: 9.3 mg/dL (ref 8.4–10.5)
Chloride: 101 mEq/L (ref 96–112)
Creatinine, Ser: 0.97 mg/dL (ref 0.40–1.50)
GFR: 76.53 mL/min (ref >60.00)
Glucose, Bld: 97 mg/dL (ref 70–99)
Potassium: 3.4 mEq/L — ABNORMAL LOW (ref 3.5–5.1)
Sodium: 142 mEq/L (ref 135–145)

## 2022-09-07 LAB — HEMOGLOBIN A1C: Hgb A1c MFr Bld: 5.8 % (ref 4.6–6.5)

## 2022-09-07 LAB — VITAMIN D 25 HYDROXY (VIT D DEFICIENCY, FRACTURES): VITD: 91.96 ng/mL (ref 30.00–100.00)

## 2022-09-07 LAB — TSH: TSH: 0.98 u[IU]/mL (ref 0.35–5.50)

## 2022-09-07 LAB — PSA: PSA: 3.66 ng/mL (ref 0.10–4.00)

## 2022-09-07 LAB — VITAMIN B12: Vitamin B-12: 1234 pg/mL — ABNORMAL HIGH (ref 211–911)

## 2022-09-07 NOTE — Patient Instructions (Signed)

## 2022-09-07 NOTE — Progress Notes (Unsigned)
Patient ID: Marcus Boyd, male   DOB: 01-02-1946, 77 y.o.   MRN: [id number]         Chief Complaint:: wellness exam and 57mofollow up (Sleep issues-waking up through the night, decreased appetite ) , weight gain,  htn, hyperglycemia, hld       HPI:  Marcus Pascucciis a 77y.o. male here for wellness exam; declines covid booster, for flu shot today, o/w up to date                        Also appetite seems lower, but wt increased. Mostly sedentary but walks outside some days.  Tends to wake upat 2 am for 15 min most nights for some reason in the past months.   Pt denies chest pain, increased sob or doe, wheezing, orthopnea, PND, increased LE swelling, palpitations, dizziness or syncope.   Pt denies polydipsia, polyuria, or new focal neuro s/s.    Pt denies fever, wt loss, night sweats, loss of appetite, or other constitutional symptoms     Wt Readings from Last 3 Encounters:  09/07/22 229 lb (103.9 kg)  03/08/22 224 lb (101.6 kg)  02/02/22 223 lb 12.8 oz (101.5 kg)   BP Readings from Last 3 Encounters:  09/07/22 128/76  03/08/22 132/70  02/02/22 128/70   Immunization History  Administered Date(s) Administered   Fluad Quad(high Dose 65+) 09/07/2022   Influenza, High Dose Seasonal PF 06/05/2021   Influenza-Unspecified 06/13/2018, 06/13/2020   PFIZER Comirnaty(Gray Top)Covid-19 Tri-Sucrose Vaccine 09/05/2020   PFIZER(Purple Top)SARS-COV-2 Vaccination 09/18/2019, 10/09/2019, 09/05/2020   Pfizer Covid-19 Vaccine Bivalent Booster 170yr& up 06/05/2021   Pneumococcal Conjugate-13 11/27/2013   Pneumococcal Polysaccharide-23 12/14/2016   Td 08/13/2004   Tdap 12/09/2015   Varicella Zoster Immune Globulin 11/14/2010  There are no preventive care reminders to display for this patient.    Past Medical History:  Diagnosis Date   Abnormal CT scan/MRI, gallbladder ? cancer 04/21/2019   [place]   ERECTILE DYSFUNCTION 04/10/2007   Exertional dyspnea     "related to my heart problems"   Flutter-fibrillation    Hx of adenomatous colonic polyps 02/20/2019   Hyperlipemia 11/14/2010   HYPERTENSION 04/10/2007   Past Surgical History:  Procedure Laterality Date   ATRIAL FLUTTER ABLATION N/A 03/12/2012   Procedure: ATRIAL FLUTTER ABLATION;  Surgeon: GrEvans LanceMD;  [facility];  Service: Cardiovascular;  Laterality: N/A;   COLONOSCOPY  04/22/2008   POLYPECTOMY     RADIOFREQUENCY ABLATION  03/12/12    reports that he quit smoking about 34 years ago. His smoking use included cigarettes. He has a 20.00 pack-year smoking history. He has never used smokeless tobacco. He reports that he does not currently use alcohol. He reports that he does not currently use drugs after having used the following drugs: Marijuana. family history includes Cancer in his father; Hypertension in his mother and sister. No Known Allergies Current Outpatient Medications on File Prior to Visit  Medication Sig Dispense Refill   amLODipine (NORVASC) 10 MG tablet Take 1 tablet (10 mg total) by mouth daily. 90 tablet 3   aspirin 81 MG tablet Take 81 mg by mouth daily.     atorvastatin (LIPITOR) 20 MG tablet Take 1 tablet (20 mg total) by mouth daily. 90 tablet 3   hydrochlorothiazide (HYDRODIURIL) 25 MG tablet Take 1 tablet (25 mg total) by mouth daily. 90 tablet 3   ibuprofen (ADVIL,MOTRIN) 200 MG  tablet Take 200 mg by mouth every 6 (six) hours as needed for moderate pain.     lisinopril (ZESTRIL) 40 MG tablet TAKE 1 TABLET BY MOUTH EVERY DAY 90 tablet 3   metoprolol succinate (TOPROL-XL) 100 MG 24 hr tablet Take with or immediately following a meal. 90 tablet 3   Multiple Vitamin (MULTIVITAMIN) tablet Take 1 tablet by mouth daily.     potassium chloride (KLOR-CON) 10 MEQ tablet Take 1 tablet (10 mEq total) by mouth daily. 90 tablet 3   No current facility-administered medications on file prior to visit.        ROS:  All others reviewed and negative.  Objective         PE:  BP 128/76 (BP Location: Right Arm, Patient Position: Sitting, Cuff Size: Large)   Pulse 71   Temp 97.7 F (36.5 C) (Oral)   Ht 6' (1.829 m)   Wt 229 lb (103.9 kg)   SpO2 94%   BMI 31.06 kg/m                 Constitutional: Pt appears in NAD               HENT: Head: NCAT.                Right Ear: External ear normal.                 Left Ear: External ear normal.                Eyes: . Pupils are equal, round, and reactive to light. Conjunctivae and EOM are normal               Nose: without d/c or deformity               Neck: Neck supple. Gross normal ROM               Cardiovascular: Normal rate and regular rhythm.                 Pulmonary/Chest: Effort normal and breath sounds without rales or wheezing.                Abd:  Soft, NT, ND, + BS, no organomegaly               Neurological: Pt is alert. At baseline orientation, motor grossly intact               Skin: Skin is warm. No rashes, no other new lesions, LE edema - none               Psychiatric: Pt behavior is normal without agitation   Micro: none  Cardiac tracings I have personally interpreted today:  none  Pertinent Radiological findings (summarize): none   Lab Results  Component Value Date   WBC 8.4 09/07/2022   HGB 14.5 09/07/2022   HCT 42.4 09/07/2022   PLT 302.0 09/07/2022   GLUCOSE 97 09/07/2022   CHOL 168 09/07/2022   TRIG 220.0 (H) 09/07/2022   HDL 33.30 (L) 09/07/2022   LDLDIRECT 88.0 09/07/2022   LDLCALC 72 03/08/2022   ALT 19 09/07/2022   AST 20 09/07/2022   NA 142 09/07/2022   K 3.4 (L) 09/07/2022   CL 101 09/07/2022   CREATININE 0.97 09/07/2022   BUN 12 09/07/2022   CO2 32 09/07/2022   TSH 0.98 09/07/2022   PSA 3.66 09/07/2022   INR 1.6 (H) 04/04/2012  HGBA1C 5.8 09/07/2022   MICROALBUR 0.7 09/07/2022   Assessment/Plan:  Marcus Boyd is a 77 y.o. Black or African American [2] male with  has a past medical history of Abnormal CT scan/MRI, gallbladder ? cancer  (04/21/2019), Atrial fibrillation, new onset ([place]) (11/16/2011), ERECTILE DYSFUNCTION (04/10/2007), Exertional dyspnea, Flutter-fibrillation, adenomatous colonic polyps (02/20/2019), Hyperlipemia (11/14/2010), and HYPERTENSION (04/10/2007).  Encounter for well adult exam with abnormal findings Age and sex appropriate education and counseling updated with regular exercise and diet Referrals for preventative services - none needed Immunizations addressed - declines covid booster, for flu shot today Smoking counseling  - none needed Evidence for depression or other mood disorder - none significant Most recent labs reviewed. I have personally reviewed and have noted: 1) the patient's medical and social history 2) The patient's current medications and supplements 3) The patient's height, weight, and BMI have been recorded in the chart   Essential hypertension BP Readings from Last 3 Encounters:  09/07/22 128/76  03/08/22 132/70  02/02/22 128/70   Stable, pt to continue medical treatment norvasc 10 qd, topxol xl 100 qd, lisinopril 40 qd, hct 25 qd   Hyperglycemia Lab Results  Component Value Date   HGBA1C 5.8 09/07/2022   Stable, pt to continue current medical treatment  - diet, wt control   Hyperlipidemia Lab Results  Component Value Date   Botines 72 03/08/2022   Uncontrolled, goal ldl < 70, pt to continue current lipitor 20 qd and lower chol diet   Sleep disturbances Ok for trial melatonin up to 10 mg qhs prn  Vitamin D deficiency Last vitamin D Lab Results  Component Value Date   VD25OH 91.96 09/07/2022   Stable, cont oral replacement   Elevated PSA Lab Results  Component Value Date   PSA 3.66 09/07/2022   PSA 2.44 03/08/2022   PSA 3.25 08/25/2021   Overall stable, cont to follow  Followup: Return in about 6 months (around 03/08/2023).  Cathlean Cower, MD 09/11/2022 4:39 AM [facility]

## 2022-09-11 ENCOUNTER — Encounter: Payer: Self-pay | Admitting: Internal Medicine

## 2022-09-11 NOTE — Assessment & Plan Note (Signed)
Lab Results  Component Value Date   PSA 3.66 09/07/2022   PSA 2.44 03/08/2022   PSA 3.25 08/25/2021   Overall stable, cont to follow

## 2022-09-11 NOTE — Assessment & Plan Note (Signed)
BP Readings from Last 3 Encounters:  09/07/22 128/76  03/08/22 132/70  02/02/22 128/70   Stable, pt to continue medical treatment norvasc 10 qd, topxol xl 100 qd, lisinopril 40 qd, hct 25 qd

## 2022-09-11 NOTE — Assessment & Plan Note (Signed)
Lab Results  Component Value Date   HGBA1C 5.8 09/07/2022   Stable, pt to continue current medical treatment  - diet, wt control

## 2022-09-11 NOTE — Assessment & Plan Note (Signed)
[facility] for trial melatonin up to 10 mg qhs prn

## 2022-09-11 NOTE — Assessment & Plan Note (Signed)
Last vitamin D Lab Results  Component Value Date   VD25OH 91.96 09/07/2022   Stable, cont oral replacement

## 2022-09-11 NOTE — Assessment & Plan Note (Addendum)
Age and sex appropriate education and counseling updated with regular exercise and diet Referrals for preventative services - none needed Immunizations addressed - declines covid booster, for flu shot today Smoking counseling  - none needed Evidence for depression or other mood disorder - none significant Most recent labs reviewed. I have personally reviewed and have noted: 1) the patient's medical and social history 2) The patient's current medications and supplements 3) The patient's height, weight, and BMI have been recorded in the chart

## 2022-09-11 NOTE — Assessment & Plan Note (Signed)
Lab Results  Component Value Date   LDLCALC 72 03/08/2022   Uncontrolled, goal ldl < 70, pt to continue current lipitor 20 qd and lower chol diet

## 2022-12-26 ENCOUNTER — Telehealth: Payer: Self-pay | Admitting: Internal Medicine

## 2022-12-26 ENCOUNTER — Other Ambulatory Visit: Payer: Self-pay

## 2022-12-26 MED ORDER — METOPROLOL SUCCINATE ER 100 MG PO TB24: ORAL_TABLET | ORAL | 3 refills | Status: DC

## 2022-12-26 MED ORDER — LISINOPRIL 40 MG PO TABS: ORAL_TABLET | ORAL | 3 refills | Status: DC

## 2022-12-26 MED ORDER — POTASSIUM CHLORIDE ER 10 MEQ PO TBCR: 10.0000 meq | EXTENDED_RELEASE_TABLET | Freq: Every day | ORAL | 3 refills | Status: DC

## 2022-12-26 MED ORDER — HYDROCHLOROTHIAZIDE 25 MG PO TABS: 25.0000 mg | ORAL_TABLET | Freq: Every day | ORAL | 3 refills | Status: DC

## 2022-12-26 MED ORDER — AMLODIPINE BESYLATE 10 MG PO TABS: 10.0000 mg | ORAL_TABLET | Freq: Every day | ORAL | 3 refills | Status: DC

## 2022-12-26 NOTE — Telephone Encounter (Signed)
Pharmacy called stating he need the  frequency of the medication metoprolol succinate (TOPROL-XL) 100 MG 24 it  is not stating enough directions for this medication. Please advise.

## 2023-01-14 ENCOUNTER — Ambulatory Visit (INDEPENDENT_AMBULATORY_CARE_PROVIDER_SITE_OTHER): Payer: Medicare Other

## 2023-01-14 VITALS — Ht 72.0 in | Wt 220.0 lb

## 2023-01-14 DIAGNOSIS — Z0000 Encounter for general adult medical examination without abnormal findings: Secondary | ICD-10-CM

## 2023-01-14 NOTE — Progress Notes (Signed)
I connected with  Marcus Boyd on 01/14/23 by a audio enabled telemedicine application and verified that I am speaking with the correct person using two identifiers.  Patient Location: Home  Provider Location: Office/Clinic  I discussed the limitations of evaluation and management by telemedicine. The patient expressed understanding and agreed to proceed.  Subjective:   Marcus Boyd is a 77 y.o. male who presents for Medicare Annual/Subsequent preventive examination.  Review of Systems     Cardiac Risk Factors include: advanced age (>93men, >51 women);dyslipidemia;hypertension;family history of premature cardiovascular disease;male gender     Objective:    Today's Vitals   01/14/23 1610  Weight: 220 lb (99.8 kg)  Height: 6' (1.829 m)  PainSc: 0-No pain   Body mass index is 29.84 kg/m.     01/14/2023    4:15 PM 02/02/2022    9:51 AM 01/27/2021    1:32 PM 01/01/2020    8:29 AM 12/25/2018    1:14 PM 12/20/2017    8:25 AM 09/11/2017    2:03 PM  Advanced Directives  Does Patient Have a Medical Advance Directive? No No No Yes No No No  Does patient want to make changes to medical advance directive?    No - Patient declined     Would patient like information on creating a medical advance directive? No - Patient declined No - Patient declined No - Patient declined  Yes (ED - Information included in AVS) No - Patient declined     Current Medications (verified) Outpatient Encounter Medications as of 01/14/2023  Medication Sig   amLODipine (NORVASC) 10 MG tablet Take 1 tablet (10 mg total) by mouth daily.   aspirin 81 MG tablet Take 81 mg by mouth daily.   atorvastatin (LIPITOR) 20 MG tablet Take 1 tablet (20 mg total) by mouth daily.   hydrochlorothiazide (HYDRODIURIL) 25 MG tablet Take 1 tablet (25 mg total) by mouth daily.   ibuprofen (ADVIL,MOTRIN) 200 MG tablet Take 200 mg by mouth every 6 (six) hours as needed for moderate pain.   lisinopril (ZESTRIL) 40 MG tablet TAKE 1  TABLET BY MOUTH EVERY DAY   metoprolol succinate (TOPROL-XL) 100 MG 24 hr tablet Take with or immediately following a meal one time per day.   Multiple Vitamin (MULTIVITAMIN) tablet Take 1 tablet by mouth daily.   potassium chloride (KLOR-CON) 10 MEQ tablet Take 1 tablet (10 mEq total) by mouth daily.   No facility-administered encounter medications on file as of 01/14/2023.    Allergies (verified) Patient has no known allergies.   History: Past Medical History:  Diagnosis Date   Abnormal CT scan/MRI, gallbladder ? 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[facility])    Hx of adenomatous colonic polyps 02/20/2019   Hyperlipemia 11/14/2010   HYPERTENSION 04/10/2007   Past Surgical History:  Procedure Laterality Date   ATRIAL FLUTTER ABLATION N/A 03/12/2012   Procedure: ATRIAL FLUTTER ABLATION;  Surgeon: Marinus Maw, MD;  Location: [facility];  Service: Cardiovascular;  Laterality: N/A;   COLONOSCOPY  04/22/2008   POLYPECTOMY     RADIOFREQUENCY ABLATION  03/12/12   Family History  Problem Relation Age of Onset   Hypertension Mother    Cancer Father        unsure what kind of cancer   Hypertension Sister        twin   Colon  cancer Neg Hx    Colon polyps Neg Hx    Esophageal cancer Neg Hx    Rectal cancer Neg Hx    Stomach cancer Neg Hx    Social History   Socioeconomic History   Marital status: Legally Separated    Spouse name: Not on file   Number of children: 4   Years of education: 12   Highest education level: High school graduate  Occupational History   Occupation: Truck driver/ retired    Associate Professor: [employer]  Tobacco Use   Smoking status: Former    Packs/day: 1.00    Years: 20.00    Additional pack years: 0.00    Total pack years: 20.00    Types: Cigarettes    Quit date: 08/13/1988    Years since quitting: 34.4   Smokeless  tobacco: Never   Tobacco comments:    03/12/12 "quit smoking in the 1990's"  Vaping Use   Vaping Use: Never used  Substance and Sexual Activity   Alcohol use: Not Currently    Comment: occ.wine or beer per pt-none recently    Drug use: Not Currently    Types: Marijuana    Comment: 03/12/12 "last marijuana was oversees; 1970's"   Sexual activity: Yes  Other Topics Concern   Not on file  Social History Narrative   Not on file   Social Determinants of Health   Financial Resource Strain: Low Risk  (01/14/2023)   Overall Financial Resource Strain (CARDIA)    Difficulty of Paying Living Expenses: Not hard at all  Food Insecurity: No Food Insecurity (01/14/2023)   Hunger Vital Sign    Worried About Running Out of Food in the Last Year: Never true    Ran Out of Food in the Last Year: Never true  Transportation Needs: No Transportation Needs (01/14/2023)   PRAPARE - Administrator, Civil Service (Medical): No    Lack of Transportation (Non-Medical): No  Physical Activity: Sufficiently Active (01/14/2023)   Exercise Vital Sign    Days of Exercise per Week: 7 days    Minutes of Exercise per Session: 30 min  Stress: No Stress Concern Present (01/14/2023)   [employer] of Occupational Health - Occupational Stress Questionnaire    Feeling of Stress : Not at all  Social Connections: Moderately Integrated (01/14/2023)   Social Connection and Isolation Panel [NHANES]    Frequency of Communication with Friends and Family: More than three times a week    Frequency of Social Gatherings with Friends and Family: More than three times a week    Attends Religious Services: 1 to 4 times per year    Active Member of [employer] or Organizations: No    Attends Banker Meetings: 1 to 4 times per year    Marital Status: Separated    Tobacco Counseling Counseling given: Not Answered Tobacco comments: 03/12/12 "quit smoking in the 1990's"   Clinical Intake:  Pre-visit preparation  completed: Yes  Pain : No/denies pain Pain Score: 0-No pain     BMI - recorded: 29.84 Nutritional Status: BMI 25 -29 Overweight Nutritional Risks: None Diabetes: No  How often do you need to have someone help you when you read instructions, pamphlets, or other written materials from your doctor or pharmacy?: 1 - Never What is the last grade level you completed in school?: HSG  Diabetic? No  Interpreter Needed?: No  Information entered by :: Charell Faulk N. Amariss Detamore, LPN.   Activities of Daily Living  01/14/2023    4:15 PM 02/02/2022   10:04 AM  In your present state of health, do you have any difficulty performing the following activities:  Hearing? 0 0  Vision? 0 0  Difficulty concentrating or making decisions? 0 0  Walking or climbing stairs? 0 0  Dressing or bathing? 0 0  Doing errands, shopping? 0 0  Preparing Food and eating ? N N  Using the Toilet? N N  In the past six months, have you accidently leaked urine? N N  Do you have problems with loss of bowel control? N N  Managing your Medications? N N  Managing your Finances? N N  Housekeeping or managing your Housekeeping? N N    Patient Care Team: Corwin Levins, MD as PCP - General Ladona Ridgel Doylene Canning, MD as Consulting Physician (Cardiology) Iva Boop, MD as Consulting Physician (Gastroenterology) Abigail Miyamoto, MD as Consulting Physician (General Surgery) Ladene Artist, MD as Consulting Physician (Oncology) Norva Pavlov, OD as Consulting Physician (Optometry)  Indicate any recent Medical Services you may have received from other than [facility] providers in the past year (date may be approximate).     Assessment:   This is a routine wellness examination for Marcus Boyd.  Hearing/Vision screen Hearing Screening - Comments:: Denies hearing difficulties   Vision Screening - Comments:: Wears rx glasses - up to date with routine eye exams with [facility]   Dietary issues and exercise activities  discussed: Current Exercise Habits: Home exercise routine, Type of exercise: walking, Time (Minutes): 30, Frequency (Times/Week): 7, Weekly Exercise (Minutes/Week): 210, Intensity: Moderate, Exercise limited by: cardiac condition(s)   Goals Addressed             This Visit's Progress    MY GOAL FOR 2024 IS TO MAINTAIN MY HEALTH.        Depression Screen    01/14/2023    4:14 PM 09/07/2022    9:22 AM 09/07/2022    9:09 AM 03/08/2022    8:40 AM 03/08/2022    8:21 AM 02/02/2022   10:01 AM 02/02/2022    9:35 AM  PHQ 2/9 Scores  PHQ - 2 Score 0 0 0 0 0 0 0  PHQ- 9 Score 0  0  4      Fall Risk    01/14/2023    4:15 PM 09/07/2022    9:22 AM 09/07/2022    9:09 AM 03/08/2022    8:39 AM 03/08/2022    8:21 AM  Fall Risk   Falls in the past year? 0 0 0 0 0  Number falls in past yr: 0 0 0 0 0  Injury with Fall? 0 0 0 0 0  Risk for fall due to : No Fall Risks  No Fall Risks    Follow up Falls prevention discussed  Falls evaluation completed      FALL RISK PREVENTION PERTAINING TO THE HOME:  Any stairs in or around the home? No  If so, are there any without handrails? No  Home free of loose throw rugs in walkways, pet beds, electrical cords, etc? Yes  Adequate lighting in your home to reduce risk of falls? Yes   ASSISTIVE DEVICES UTILIZED TO PREVENT FALLS:  Life alert? No  Use of a cane, walker or w/c? No  Grab bars in the bathroom? No  Shower chair or bench in shower? No  Elevated toilet seat or a handicapped toilet? No   TIMED UP AND GO:  Was the test  performed? No . Telephonic Visit  Cognitive Function:    12/20/2017    8:31 AM  MMSE - Mini Mental State Exam  Orientation to time 5  Orientation to Place 5  Registration 3  Attention/ Calculation 5  Recall 2  Language- name 2 objects 2  Language- repeat 1  Language- follow 3 step command 3  Language- read & follow direction 1  Write a sentence 1  Copy design 1  Total score 29        01/14/2023    4:19 PM 02/02/2022     9:37 AM  6CIT Screen  What Year? 0 points 0 points  What month? 0 points 0 points  What time? 0 points 0 points  Count back from 20 0 points 0 points  Months in reverse 0 points 0 points  Repeat phrase 0 points 0 points  Total Score 0 points 0 points    Immunizations Immunization History  Administered Date(s) Administered   Fluad Quad(high Dose 65+) 09/07/2022   Influenza, High Dose Seasonal PF 06/05/2021   Influenza-Unspecified 06/13/2018, 06/13/2020   PFIZER Comirnaty(Gray Top)Covid-19 Tri-Sucrose Vaccine 09/05/2020   PFIZER(Purple Top)SARS-COV-2 Vaccination 09/18/2019, 10/09/2019, 09/05/2020   Pfizer Covid-19 Vaccine Bivalent Booster 59yrs & up 06/05/2021   Pneumococcal Conjugate-13 11/27/2013   Pneumococcal Polysaccharide-23 12/14/2016   Td 08/13/2004   Tdap 12/09/2015   Varicella Zoster Immune Globulin 11/14/2010    TDAP status: Up to date  Flu Vaccine status: Up to date  Pneumococcal vaccine status: Up to date  Covid-19 vaccine status: Completed vaccines  Qualifies for Shingles Vaccine? Yes   Zostavax completed Yes   Shingrix Completed?: No.    Education has been provided regarding the importance of this vaccine. Patient has been advised to call insurance company to determine out of pocket expense if they have not yet received this vaccine. Advised may also receive vaccine at local pharmacy or Health Dept. Verbalized acceptance and understanding.  Screening Tests Health Maintenance  Topic Date Due   COVID-19 Vaccine (6 - 2023-24 season) 04/13/2022   INFLUENZA VACCINE  03/14/2023   Medicare Annual Wellness (AWV)  01/14/2024   DTaP/Tdap/Td (3 - Td or Tdap) 12/08/2025   Colonoscopy  02/19/2029   Pneumonia Vaccine 58+ Years old  Completed   Hepatitis C Screening  Completed   HPV VACCINES  Aged Out   Zoster Vaccines- Shingrix  Discontinued    Health Maintenance  Health Maintenance Due  Topic Date Due   COVID-19 Vaccine (6 - 2023-24 season) 04/13/2022     Colorectal cancer screening: Type of screening: Colonoscopy. Completed 02/20/2019. Repeat every 10 years  Lung Cancer Screening: (Low Dose CT Chest recommended if Age 29-80 years, 30 pack-year currently smoking OR have quit w/in 15years.) does not qualify.   Lung Cancer Screening Referral: no  Additional Screening:  Hepatitis C Screening: does qualify; Completed 12/14/2016  Vision Screening: Recommended annual ophthalmology exams for early detection of glaucoma and other disorders of the eye. Is the patient up to date with their annual eye exam?  Yes  Who is the provider or what is the name of the office in which the patient attends annual eye exams? [facility] If pt is not established with a provider, would they like to be referred to a provider to establish care? No .   Dental Screening: Recommended annual dental exams for proper oral hygiene  Community Resource Referral / Chronic Care Management: CRR required this visit?  No   CCM required this visit?  No  Plan:     I have personally reviewed and noted the following in the patient's chart:   Medical and social history Use of alcohol, tobacco or illicit drugs  Current medications and supplements including opioid prescriptions. Patient is not currently taking opioid prescriptions. Functional ability and status Nutritional status Physical activity Advanced directives List of other physicians Hospitalizations, surgeries, and ER visits in previous 12 months Vitals Screenings to include cognitive, depression, and falls Referrals and appointments  In addition, I have reviewed and discussed with patient certain preventive protocols, quality metrics, and best practice recommendations. A written personalized care plan for preventive services as well as general preventive health recommendations were provided to patient.     Mickeal Needy, LPN   04/18/453   Nurse Notes: Normal cognitive status assessed by direct  observation via telephone conversation by this Nurse Health Advisor. No abnormalities found.

## 2023-01-14 NOTE — Patient Instructions (Signed)
[name] , Thank you for taking time to come for your Medicare Wellness Visit. I appreciate your ongoing commitment to your health goals. Please review the following plan we discussed and let me know if I can assist you in the future.   These are the goals we discussed:  Goals      MY GOAL FOR 2024 IS TO [facility].        This is a list of the screening recommended for you and due dates:  Health Maintenance  Topic Date Due   COVID-19 Vaccine (6 - 2023-24 season) 04/13/2022   Flu Shot  03/14/2023   Medicare Annual Wellness Visit  01/14/2024   DTaP/Tdap/Td vaccine (3 - Td or Tdap) 12/08/2025   Colon Cancer Screening  02/19/2029   Pneumonia Vaccine  Completed   Hepatitis C Screening  Completed   HPV Vaccine  Aged Out   Zoster (Shingles) Vaccine  Discontinued    Advanced directives: Yes; Please bring a copy of your health care power of attorney and living will to the office at your convenience.  Conditions/risks identified: Yes  Next appointment: Follow up in one year for your annual wellness visit.   Preventive Care 64 Years and Older, Male  Preventive care refers to lifestyle choices and visits with your health care provider that can promote health and wellness. What does preventive care include? A yearly physical exam. This is also called an annual well check. Dental exams once or twice a year. Routine eye exams. Ask your health care provider how often you should have your eyes checked. Personal lifestyle choices, including: Daily care of your teeth and gums. Regular physical activity. Eating a healthy diet. Avoiding tobacco and drug use. Limiting alcohol use. Practicing safe sex. Taking low doses of aspirin every day. Taking vitamin and mineral supplements as recommended by your health care provider. What happens during an annual well check? The services and screenings done by your health care provider during your annual well check will depend on your age,  overall health, lifestyle risk factors, and family history of disease. Counseling  Your health care provider may ask you questions about your: Alcohol use. Tobacco use. Drug use. Emotional well-being. Home and relationship well-being. Sexual activity. Eating habits. History of falls. Memory and ability to understand (cognition). Work and work Astronomer. Screening  You may have the following tests or measurements: Height, weight, and BMI. Blood pressure. Lipid and cholesterol levels. These may be checked every 5 years, or more frequently if you are over 34 years old. Skin check. Lung cancer screening. You may have this screening every year starting at age 76 if you have a 30-pack-year history of smoking and currently smoke or have quit within the past 15 years. Fecal occult blood test (FOBT) of the stool. You may have this test every year starting at age 76. Flexible sigmoidoscopy or colonoscopy. You may have a sigmoidoscopy every 5 years or a colonoscopy every 10 years starting at age 76. Prostate cancer screening. Recommendations will vary depending on your family history and other risks. Hepatitis C blood test. Hepatitis B blood test. Sexually transmitted disease (STD) testing. Diabetes screening. This is done by checking your blood sugar (glucose) after you have not eaten for a while (fasting). You may have this done every 1-3 years. Abdominal aortic aneurysm (AAA) screening. You may need this if you are a current or former smoker. Osteoporosis. You may be screened starting at age 76 if you are at high risk.  Talk with your health care provider about your test results, treatment options, and if necessary, the need for more tests. Vaccines  Your health care provider may recommend certain vaccines, such as: Influenza vaccine. This is recommended every year. Tetanus, diphtheria, and acellular pertussis (Tdap, Td) vaccine. You may need a Td booster every 10 years. Zoster vaccine.  You may need this after age 49. Pneumococcal 13-valent conjugate (PCV13) vaccine. One dose is recommended after age 76. Pneumococcal polysaccharide (PPSV23) vaccine. One dose is recommended after age 18. Talk to your health care provider about which screenings and vaccines you need and how often you need them. This information is not intended to replace advice given to you by your health care provider. Make sure you discuss any questions you have with your health care provider. Document Released: 08/26/2015 Document Revised: 04/18/2016 Document Reviewed: 05/31/2015 Elsevier Interactive Patient Education  2017 [employer].  Fall Prevention in the Home Falls can cause injuries. They can happen to people of all ages. There are many things you can do to make your home safe and to help prevent falls. What can I do on the outside of my home? Regularly fix the edges of walkways and driveways and fix any cracks. Remove anything that might make you trip as you walk through a door, such as a raised step or threshold. Trim any bushes or trees on the path to your home. Use bright outdoor lighting. Clear any walking paths of anything that might make someone trip, such as rocks or tools. Regularly check to see if handrails are loose or broken. Make sure that both sides of any steps have handrails. Any raised decks and porches should have guardrails on the edges. Have any leaves, snow, or ice cleared regularly. Use sand or salt on walking paths during winter. Clean up any spills in your garage right away. This includes oil or grease spills. What can I do in the bathroom? Use night lights. Install grab bars by the toilet and in the tub and shower. Do not use towel bars as grab bars. Use non-skid mats or decals in the tub or shower. If you need to sit down in the shower, use a plastic, non-slip stool. Keep the floor dry. Clean up any water that spills on the floor as soon as it happens. Remove soap  buildup in the tub or shower regularly. Attach bath mats securely with double-sided non-slip rug tape. Do not have throw rugs and other things on the floor that can make you trip. What can I do in the bedroom? Use night lights. Make sure that you have a light by your bed that is easy to reach. Do not use any sheets or blankets that are too big for your bed. They should not hang down onto the floor. Have a firm chair that has side arms. You can use this for support while you get dressed. Do not have throw rugs and other things on the floor that can make you trip. What can I do in the kitchen? Clean up any spills right away. Avoid walking on wet floors. Keep items that you use a lot in easy-to-reach places. If you need to reach something above you, use a strong step stool that has a grab bar. Keep electrical cords out of the way. Do not use floor polish or wax that makes floors slippery. If you must use wax, use non-skid floor wax. Do not have throw rugs and other things on the floor that can make  you trip. What can I do with my stairs? Do not leave any items on the stairs. Make sure that there are handrails on both sides of the stairs and use them. Fix handrails that are broken or loose. Make sure that handrails are as long as the stairways. Check any carpeting to make sure that it is firmly attached to the stairs. Fix any carpet that is loose or worn. Avoid having throw rugs at the top or bottom of the stairs. If you do have throw rugs, attach them to the floor with carpet tape. Make sure that you have a light switch at the top of the stairs and the bottom of the stairs. If you do not have them, ask someone to add them for you. What else can I do to help prevent falls? Wear shoes that: Do not have high heels. Have rubber bottoms. Are comfortable and fit you well. Are closed at the toe. Do not wear sandals. If you use a stepladder: Make sure that it is fully opened. Do not climb a closed  stepladder. Make sure that both sides of the stepladder are locked into place. Ask someone to hold it for you, if possible. Clearly mark and make sure that you can see: Any grab bars or handrails. First and last steps. Where the edge of each step is. Use tools that help you move around (mobility aids) if they are needed. These include: Canes. Walkers. Scooters. Crutches. Turn on the lights when you go into a dark area. Replace any light bulbs as soon as they burn out. Set up your furniture so you have a clear path. Avoid moving your furniture around. If any of your floors are uneven, fix them. If there are any pets around you, be aware of where they are. Review your medicines with your doctor. Some medicines can make you feel dizzy. This can increase your chance of falling. Ask your doctor what other things that you can do to help prevent falls. This information is not intended to replace advice given to you by your health care provider. Make sure you discuss any questions you have with your health care provider. Document Released: 05/26/2009 Document Revised: 01/05/2016 Document Reviewed: 09/03/2014 Elsevier Interactive Patient Education  2017 [employer].

## 2023-02-15 ENCOUNTER — Ambulatory Visit: Payer: Medicare Other | Admitting: Internal Medicine

## 2023-02-22 ENCOUNTER — Telehealth: Payer: Self-pay

## 2023-02-22 ENCOUNTER — Encounter: Payer: Self-pay | Admitting: Internal Medicine

## 2023-02-22 ENCOUNTER — Ambulatory Visit: Payer: Medicare Other | Admitting: Internal Medicine

## 2023-02-22 VITALS — BP 136/84 | HR 65 | Temp 98.8°F | Ht 72.0 in | Wt 224.0 lb

## 2023-02-22 DIAGNOSIS — E559 Vitamin D deficiency, unspecified: Secondary | ICD-10-CM | POA: Diagnosis not present

## 2023-02-22 DIAGNOSIS — I10 Essential (primary) hypertension: Secondary | ICD-10-CM | POA: Diagnosis not present

## 2023-02-22 DIAGNOSIS — E7800 Pure hypercholesterolemia, unspecified: Secondary | ICD-10-CM | POA: Diagnosis not present

## 2023-02-22 DIAGNOSIS — R739 Hyperglycemia, unspecified: Secondary | ICD-10-CM | POA: Diagnosis not present

## 2023-02-22 LAB — LIPID PANEL
Cholesterol: 185 mg/dL (ref 0–200)
HDL: 31.5 mg/dL — ABNORMAL LOW (ref >39.00)
NonHDL: 153.13
Total CHOL/HDL Ratio: 6
Triglycerides: 286 mg/dL — ABNORMAL HIGH (ref 0.0–149.0)
VLDL: 57.2 mg/dL — ABNORMAL HIGH (ref 0.0–40.0)

## 2023-02-22 LAB — VITAMIN D 25 HYDROXY (VIT D DEFICIENCY, FRACTURES): VITD: 101.33 ng/mL (ref 30.00–100.00)

## 2023-02-22 LAB — HEPATIC FUNCTION PANEL
ALT: 14 U/L (ref 0–53)
AST: 18 U/L (ref 0–37)
Albumin: 4.2 g/dL (ref 3.5–5.2)
Alkaline Phosphatase: 61 U/L (ref 39–117)
Bilirubin, Direct: 0.1 mg/dL (ref 0.0–0.3)
Total Bilirubin: 0.3 mg/dL (ref 0.2–1.2)
Total Protein: 7.7 g/dL (ref 6.0–8.3)

## 2023-02-22 LAB — BASIC METABOLIC PANEL
BUN: 11 mg/dL (ref 6–23)
CO2: 35 mEq/L — ABNORMAL HIGH (ref 19–32)
Calcium: 9.9 mg/dL (ref 8.4–10.5)
Chloride: 102 mEq/L (ref 96–112)
Creatinine, Ser: 1.22 mg/dL (ref 0.40–1.50)
GFR: 57.93 mL/min — ABNORMAL LOW (ref >60.00)
Glucose, Bld: 103 mg/dL — ABNORMAL HIGH (ref 70–99)
Potassium: 3.4 mEq/L — ABNORMAL LOW (ref 3.5–5.1)
Sodium: 143 mEq/L (ref 135–145)

## 2023-02-22 LAB — HEMOGLOBIN A1C: Hgb A1c MFr Bld: 5.7 % (ref 4.6–6.5)

## 2023-02-22 LAB — LDL CHOLESTEROL, DIRECT: Direct LDL: 97 mg/dL

## 2023-02-22 NOTE — Progress Notes (Signed)
The test results show that your current treatment is OK, as the tests are stable.  Please continue the same plan.  There is no other need for change of treatment or further evaluation based on these results, at this time.  thanks 

## 2023-02-22 NOTE — Assessment & Plan Note (Signed)
Last vitamin D Lab Results  Component Value Date   VD25OH 91.96 09/07/2022   Stable, cont oral replacement  

## 2023-02-22 NOTE — Telephone Encounter (Signed)
CRITICAL VALUE STICKER  CRITICAL VALUE: Vitamin D 101.33  RECEIVER (on-site recipient of call): Makynzee Tigges, CMA  DATE & TIME NOTIFIED: 02/22/23 12.38pm  MESSENGER (representative from lab): Hope  MD NOTIFIED: PCP  TIME OF NOTIFICATION: 02/22/23 12.38pm

## 2023-02-22 NOTE — Patient Instructions (Signed)
Please continue all other medications as before, and refills have been done if requested.  Please have the pharmacy call with any other refills you may need.  Please continue your efforts at being more active, low cholesterol diet, and weight control.  Please keep your appointments with your specialists as you may have planned  Please go to the LAB at the blood drawing area for the tests to be done  You will be contacted by phone if any changes need to be made immediately.  Otherwise, you will receive a letter about your results with an explanation, but please check with MyChart first.  Please remember to sign up for MyChart if you have not done so, as this will be important to you in the future with finding out test results, communicating by private email, and scheduling acute appointments online when needed.  Please make an Appointment to return in 6 months, or sooner if needed, 

## 2023-02-22 NOTE — Progress Notes (Signed)
Patient ID: Marcus Boyd, male   DOB: 09-20-1945, 77 y.o.   MRN: [id number]        Chief Complaint: follow up HTN, HLD and hyperglycemia , low vit d       HPI:  Marcus Boyd is a 77 y.o. male here overal doing ok, Pt denies chest pain, increased sob or doe, wheezing, orthopnea, PND, increased LE swelling, palpitations, dizziness or syncope.   Pt denies polydipsia, polyuria, or new focal neuro s/s.    Pt denies fever, wt loss, night sweats, loss of appetite, or other constitutional symptoms         Wt Readings from Last 3 Encounters:  02/22/23 224 lb (101.6 kg)  01/14/23 220 lb (99.8 kg)  09/07/22 229 lb (103.9 kg)   BP Readings from Last 3 Encounters:  02/22/23 136/84  09/07/22 128/76  03/08/22 132/70         Past Medical History:  Diagnosis Date   Abnormal CT scan/MRI, gallbladder ? 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HCC)    Hx of adenomatous colonic polyps 02/20/2019   Hyperlipemia 11/14/2010   HYPERTENSION 04/10/2007   Past Surgical History:  Procedure Laterality Date   ATRIAL FLUTTER ABLATION N/A 03/12/2012   Procedure: ATRIAL FLUTTER ABLATION;  Surgeon: Marinus Maw, MD;  Location: [facility] CATH LAB;  Service: Cardiovascular;  Laterality: N/A;   COLONOSCOPY  04/22/2008   POLYPECTOMY     RADIOFREQUENCY ABLATION  03/12/12    reports that he quit smoking about 34 years ago. His smoking use included cigarettes. He started smoking about 54 years ago. He has a 20 pack-year smoking history. He has never used smokeless tobacco. He reports that he does not currently use alcohol. He reports that he does not currently use drugs after having used the following drugs: Marijuana. family history includes Cancer in his father; Hypertension in his mother and sister. No Known Allergies Current Outpatient Medications on File Prior to Visit  Medication  Sig Dispense Refill   amLODipine (NORVASC) 10 MG tablet Take 1 tablet (10 mg total) by mouth daily. 90 tablet 3   aspirin 81 MG tablet Take 81 mg by mouth daily.     atorvastatin (LIPITOR) 20 MG tablet Take 1 tablet (20 mg total) by mouth daily. 90 tablet 3   hydrochlorothiazide (HYDRODIURIL) 25 MG tablet Take 1 tablet (25 mg total) by mouth daily. 90 tablet 3   ibuprofen (ADVIL,MOTRIN) 200 MG tablet Take 200 mg by mouth every 6 (six) hours as needed for moderate pain.     lisinopril (ZESTRIL) 40 MG tablet TAKE 1 TABLET BY MOUTH EVERY DAY 90 tablet 3   metoprolol succinate (TOPROL-XL) 100 MG 24 hr tablet Take with or immediately following a meal one time per day. 90 tablet 3   Multiple Vitamin (MULTIVITAMIN) tablet Take 1 tablet by mouth daily.     potassium chloride (KLOR-CON) 10 MEQ tablet Take 1 tablet (10 mEq total) by mouth daily. 90 tablet 3   No current facility-administered medications on file prior to visit.        ROS:  All others reviewed and negative.  Objective        PE:  BP 136/84 (BP Location: Right Arm, Patient Position: Sitting, Cuff Size: Normal)   Pulse 65   Temp 98.8 F (37.1 C) (Oral)   Ht 6' (1.829 m)  Wt 224 lb (101.6 kg)   SpO2 95%   BMI 30.38 kg/m                 Constitutional: Pt appears in NAD               HENT: Head: NCAT.                Right Ear: External ear normal.                 Left Ear: External ear normal.                Eyes: . Pupils are equal, round, and reactive to light. Conjunctivae and EOM are normal               Nose: without d/c or deformity               Neck: Neck supple. Gross normal ROM               Cardiovascular: Normal rate and regular rhythm.                 Pulmonary/Chest: Effort normal and breath sounds without rales or wheezing.                Abd:  Soft, NT, ND, + BS, no organomegaly               Neurological: Pt is alert. At baseline orientation, motor grossly intact               Skin: Skin is warm. No rashes,  no other new lesions, LE edema - none               Psychiatric: Pt behavior is normal without agitation   Micro: none  Cardiac tracings I have personally interpreted today:  none  Pertinent Radiological findings (summarize): none   Lab Results  Component Value Date   WBC 8.4 09/07/2022   HGB 14.5 09/07/2022   HCT 42.4 09/07/2022   PLT 302.0 09/07/2022   GLUCOSE 103 (H) 02/22/2023   CHOL 185 02/22/2023   TRIG 286.0 (H) 02/22/2023   HDL 31.50 (L) 02/22/2023   LDLDIRECT 97.0 02/22/2023   LDLCALC 72 03/08/2022   ALT 14 02/22/2023   AST 18 02/22/2023   NA 143 02/22/2023   K 3.4 (L) 02/22/2023   CL 102 02/22/2023   CREATININE 1.22 02/22/2023   BUN 11 02/22/2023   CO2 35 (H) 02/22/2023   TSH 0.98 09/07/2022   PSA 3.66 09/07/2022   INR 1.6 (H) 04/04/2012   HGBA1C 5.7 02/22/2023   MICROALBUR 0.7 09/07/2022   Assessment/Plan:  Marcus Boyd is a 76 y.o. Black or African American [2] male with  has a past medical history of Abnormal CT scan/MRI, gallbladder ? cancer (04/21/2019), Atrial fibrillation, new onset (HCC) (11/16/2011), ERECTILE DYSFUNCTION (04/10/2007), Exertional dyspnea, Flutter-fibrillation (HCC), adenomatous colonic polyps (02/20/2019), Hyperlipemia (11/14/2010), and HYPERTENSION (04/10/2007).  Vitamin D deficiency Last vitamin D Lab Results  Component Value Date   VD25OH 91.96 09/07/2022   Stable, cont oral replacement   Essential hypertension BP Readings from Last 3 Encounters:  02/22/23 136/84  09/07/22 128/76  03/08/22 132/70   Stable, pt to continue medical treatment norvasc 10 every day, hct 25 every day, lisinopril 40 every day, toprol xl 100 qd   Hyperglycemia Lab Results  Component Value Date   HGBA1C 5.7 02/22/2023   Stable, pt to continue current medical treatment  -  diet, wt control   Hyperlipidemia Lab Results  Component Value Date   LDLCALC 72 03/08/2022   Uncontrolled, goal ldl < 70, for lower chol diet and pt to continue current  statin lipitor 20 every day as decines furhter increase for now  Followup: Return in about 6 months (around 08/25/2023).  Oliver Barre, MD 02/25/2023 8:25 PM [facility]

## 2023-02-25 ENCOUNTER — Encounter: Payer: Self-pay | Admitting: Internal Medicine

## 2023-02-25 NOTE — Assessment & Plan Note (Signed)
BP Readings from Last 3 Encounters:  02/22/23 136/84  09/07/22 128/76  03/08/22 132/70   Stable, pt to continue medical treatment norvasc 10 every day, hct 25 every day, lisinopril 40 every day, toprol xl 100 qd

## 2023-02-25 NOTE — Assessment & Plan Note (Signed)
Lab Results  Component Value Date   HGBA1C 5.7 02/22/2023   Stable, pt to continue current medical treatment  - diet, wt control

## 2023-02-25 NOTE — Assessment & Plan Note (Signed)
Lab Results  Component Value Date   LDLCALC 72 03/08/2022   Uncontrolled, goal ldl < 70, for lower chol diet and pt to continue current statin lipitor 20 every day as decines furhter increase for now

## 2023-08-30 ENCOUNTER — Ambulatory Visit: Payer: Medicare Other | Admitting: Internal Medicine

## 2023-09-03 ENCOUNTER — Ambulatory Visit: Payer: Medicare Other | Admitting: Internal Medicine

## 2023-09-03 ENCOUNTER — Encounter: Payer: Self-pay | Admitting: Internal Medicine

## 2023-09-03 VITALS — BP 160/80 | HR 65 | Temp 97.7°F | Ht 72.0 in | Wt 224.0 lb

## 2023-09-03 DIAGNOSIS — E538 Deficiency of other specified B group vitamins: Secondary | ICD-10-CM

## 2023-09-03 DIAGNOSIS — H6121 Impacted cerumen, right ear: Secondary | ICD-10-CM | POA: Diagnosis not present

## 2023-09-03 DIAGNOSIS — Z0001 Encounter for general adult medical examination with abnormal findings: Secondary | ICD-10-CM

## 2023-09-03 DIAGNOSIS — E876 Hypokalemia: Secondary | ICD-10-CM

## 2023-09-03 DIAGNOSIS — R739 Hyperglycemia, unspecified: Secondary | ICD-10-CM | POA: Diagnosis not present

## 2023-09-03 DIAGNOSIS — E7800 Pure hypercholesterolemia, unspecified: Secondary | ICD-10-CM

## 2023-09-03 DIAGNOSIS — R972 Elevated prostate specific antigen [PSA]: Secondary | ICD-10-CM | POA: Diagnosis not present

## 2023-09-03 DIAGNOSIS — Z0000 Encounter for general adult medical examination without abnormal findings: Secondary | ICD-10-CM

## 2023-09-03 DIAGNOSIS — E559 Vitamin D deficiency, unspecified: Secondary | ICD-10-CM | POA: Diagnosis not present

## 2023-09-03 DIAGNOSIS — I10 Essential (primary) hypertension: Secondary | ICD-10-CM

## 2023-09-03 DIAGNOSIS — N401 Enlarged prostate with lower urinary tract symptoms: Secondary | ICD-10-CM

## 2023-09-03 DIAGNOSIS — R351 Nocturia: Secondary | ICD-10-CM

## 2023-09-03 LAB — BASIC METABOLIC PANEL
BUN: 12 mg/dL (ref 6–23)
CO2: 33 meq/L — ABNORMAL HIGH (ref 19–32)
Calcium: 9.5 mg/dL (ref 8.4–10.5)
Chloride: 100 meq/L (ref 96–112)
Creatinine, Ser: 1.01 mg/dL (ref 0.40–1.50)
GFR: 72.4 mL/min (ref >60.00)
Glucose, Bld: 82 mg/dL (ref 70–99)
Potassium: 3.3 meq/L — ABNORMAL LOW (ref 3.5–5.1)
Sodium: 143 meq/L (ref 135–145)

## 2023-09-03 LAB — CBC WITH DIFFERENTIAL/PLATELET
Basophils Absolute: 0 10*3/uL (ref 0.0–0.1)
Basophils Relative: 0.5 % (ref 0.0–3.0)
Eosinophils Absolute: 0.2 10*3/uL (ref 0.0–0.7)
Eosinophils Relative: 2 % (ref 0.0–5.0)
HCT: 45.4 % (ref 39.0–52.0)
Hemoglobin: 15.1 g/dL (ref 13.0–17.0)
Lymphocytes Relative: 28.6 % (ref 12.0–46.0)
Lymphs Abs: 3 10*3/uL (ref 0.7–4.0)
MCHC: 33.2 g/dL (ref 30.0–36.0)
MCV: 87.5 fL (ref 78.0–100.0)
Monocytes Absolute: 0.9 10*3/uL (ref 0.1–1.0)
Monocytes Relative: 9.2 % (ref 3.0–12.0)
Neutro Abs: 6.2 10*3/uL (ref 1.4–7.7)
Neutrophils Relative %: 59.7 % (ref 43.0–77.0)
Platelets: 361 10*3/uL (ref 150.0–400.0)
RBC: 5.18 Mil/uL (ref 4.22–5.81)
RDW: 14.7 % (ref 11.5–15.5)
WBC: 10.3 10*3/uL (ref 4.0–10.5)

## 2023-09-03 LAB — TSH: TSH: 1.32 u[IU]/mL (ref 0.35–5.50)

## 2023-09-03 LAB — URINALYSIS, ROUTINE W REFLEX MICROSCOPIC
Bilirubin Urine: NEGATIVE
Hgb urine dipstick: NEGATIVE
Ketones, ur: NEGATIVE
Leukocytes,Ua: NEGATIVE
Nitrite: NEGATIVE
RBC / HPF: NONE SEEN (ref 0–?)
Specific Gravity, Urine: 1.02 (ref 1.000–1.030)
Total Protein, Urine: NEGATIVE
Urine Glucose: NEGATIVE
Urobilinogen, UA: 0.2 (ref 0.0–1.0)
pH: 6.5 (ref 5.0–8.0)

## 2023-09-03 LAB — HEPATIC FUNCTION PANEL
ALT: 16 U/L (ref 0–53)
AST: 20 U/L (ref 0–37)
Albumin: 4.4 g/dL (ref 3.5–5.2)
Alkaline Phosphatase: 55 U/L (ref 39–117)
Bilirubin, Direct: 0.1 mg/dL (ref 0.0–0.3)
Total Bilirubin: 0.3 mg/dL (ref 0.2–1.2)
Total Protein: 7.6 g/dL (ref 6.0–8.3)

## 2023-09-03 LAB — VITAMIN D 25 HYDROXY (VIT D DEFICIENCY, FRACTURES): VITD: 85.7 ng/mL (ref 30.00–100.00)

## 2023-09-03 LAB — LIPID PANEL
Cholesterol: 252 mg/dL — ABNORMAL HIGH (ref 0–200)
HDL: 34.8 mg/dL — ABNORMAL LOW (ref >39.00)
LDL Cholesterol: 146 mg/dL — ABNORMAL HIGH (ref 0–99)
NonHDL: 217.27
Total CHOL/HDL Ratio: 7
Triglycerides: 357 mg/dL — ABNORMAL HIGH (ref 0.0–149.0)
VLDL: 71.4 mg/dL — ABNORMAL HIGH (ref 0.0–40.0)

## 2023-09-03 LAB — VITAMIN B12: Vitamin B-12: 1488 pg/mL — ABNORMAL HIGH (ref 211–911)

## 2023-09-03 LAB — PSA: PSA: 4.46 ng/mL — ABNORMAL HIGH (ref 0.10–4.00)

## 2023-09-03 LAB — HEMOGLOBIN A1C: Hgb A1c MFr Bld: 5.9 % (ref 4.6–6.5)

## 2023-09-03 MED ORDER — LISINOPRIL 40 MG PO TABS: ORAL_TABLET | ORAL | 3 refills | Status: DC

## 2023-09-03 MED ORDER — AMLODIPINE BESYLATE 10 MG PO TABS: 10.0000 mg | ORAL_TABLET | Freq: Every day | ORAL | 3 refills | Status: DC

## 2023-09-03 MED ORDER — TAMSULOSIN HCL 0.4 MG PO CAPS: 0.4000 mg | ORAL_CAPSULE | Freq: Every day | ORAL | 3 refills | Status: DC

## 2023-09-03 MED ORDER — METOPROLOL SUCCINATE ER 100 MG PO TB24: ORAL_TABLET | ORAL | 3 refills | Status: DC

## 2023-09-03 MED ORDER — POTASSIUM CHLORIDE ER 10 MEQ PO TBCR: 10.0000 meq | EXTENDED_RELEASE_TABLET | Freq: Every day | ORAL | 3 refills | Status: DC

## 2023-09-03 MED ORDER — HYDROCHLOROTHIAZIDE 25 MG PO TABS: 25.0000 mg | ORAL_TABLET | Freq: Every day | ORAL | 3 refills | Status: DC

## 2023-09-03 MED ORDER — ATORVASTATIN CALCIUM 20 MG PO TABS: 20.0000 mg | ORAL_TABLET | Freq: Every day | ORAL | 3 refills | Status: DC

## 2023-09-03 NOTE — Patient Instructions (Addendum)
Your right ear is cleared of wax today  Please take all new medication as prescribed - the flomax to help the prostate  Please continue to monitor your BP on a regular basis at home  Please continue all other medications as before, and refills have been done if requested.  Please have the pharmacy call with any other refills you may need.  Please continue your efforts at being more active, low cholesterol diet, and weight control.  You are otherwise up to date with prevention measures today.  Please keep your appointments with your specialists as you may have planned  Please go to the LAB at the blood drawing area for the tests to be done  You will be contacted by phone if any changes need to be made immediately.  Otherwise, you will receive a letter about your results with an explanation, but please check with MyChart first.  Please make an Appointment to return in 6 months, or sooner if needed

## 2023-09-03 NOTE — Progress Notes (Signed)
Patient ID: Marcus Boyd, male   DOB: 1946/08/01, 78 y.o.   MRN: [id number]         Chief Complaint:: wellness exam and bph with nocturia, right ear wax impaction, htn, hyperglycemia, hld, elev psa       HPI:  Marcus Boyd is a 78 y.o. male here for wellness exam'; declines covid booster, flu shot, o/w up to date                        Also Pt denies chest pain, increased sob or doe, wheezing, orthopnea, PND, increased LE swelling, palpitations, dizziness or syncope.   Pt denies polydipsia, polyuria, or new focal neuro s/s.    Pt denies fever, wt loss, night sweats, loss of appetite, or other constitutional symptoms  Denies urinary symptoms such as dysuria, frequency, urgency, flank pain, hematuria or n/v, fever, chills. But does have nocturia with midl stream slowing.  BP controlled at home.  Also right ear wax impacted with reduced hearing.     Wt Readings from Last 3 Encounters:  09/03/23 224 lb (101.6 kg)  02/22/23 224 lb (101.6 kg)  01/14/23 220 lb (99.8 kg)   BP Readings from Last 3 Encounters:  09/03/23 (!) 160/80  02/22/23 136/84  09/07/22 128/76   Immunization History  Administered Date(s) Administered   Fluad Quad(high Dose 65+) 09/07/2022   Influenza, High Dose Seasonal PF 06/05/2021   Influenza-Unspecified 06/13/2018, 06/13/2020   PFIZER Comirnaty(Gray Top)Covid-19 Tri-Sucrose Vaccine 09/05/2020   PFIZER(Purple Top)SARS-COV-2 Vaccination 09/18/2019, 10/09/2019, 09/05/2020   Pfizer Covid-19 Vaccine Bivalent Booster 39yrs & up 06/05/2021   Pneumococcal Conjugate-13 11/27/2013   Pneumococcal Polysaccharide-23 12/14/2016   Td 08/13/2004   Tdap 12/09/2015   Varicella Zoster Immune Globulin 11/14/2010   There are no preventive care reminders to display for this patient.     Past Medical History:  Diagnosis Date   Abnormal CT scan/MRI, gallbladder ? cancer 04/21/2019   Atrial fibrillation, new onset (HCC) 11/16/2011   ERECTILE DYSFUNCTION 04/10/2007   Exertional  dyspnea    "related to my heart problems"   Flutter-fibrillation (HCC)    Hx of adenomatous colonic polyps 02/20/2019   Hyperlipemia 11/14/2010   HYPERTENSION 04/10/2007   Past Surgical History:  Procedure Laterality Date   ATRIAL FLUTTER ABLATION N/A 03/12/2012   Procedure: ATRIAL FLUTTER ABLATION;  Surgeon: Marinus Maw, MD;  Location: [facility] CATH LAB;  Service: Cardiovascular;  Laterality: N/A;   COLONOSCOPY  04/22/2008   POLYPECTOMY     RADIOFREQUENCY ABLATION  03/12/12    reports that he quit smoking about 35 years ago. His smoking use included cigarettes. He started smoking about 55 years ago. He has a 20 pack-year smoking history. He has never used smokeless tobacco. He reports that he does not currently use alcohol. He reports that he does not currently use drugs after having used the following drugs: Marijuana. family history includes Cancer in his father; Hypertension in his mother and sister. No Known Allergies Current Outpatient Medications on File Prior to Visit  Medication Sig Dispense Refill   aspirin 81 MG tablet Take 81 mg by mouth daily.     ibuprofen (ADVIL,MOTRIN) 200 MG tablet Take 200 mg by mouth every 6 (six) hours as needed for moderate pain.     Multiple Vitamin (MULTIVITAMIN) tablet Take 1 tablet by mouth daily.     No current facility-administered medications on file prior to visit.        ROS:  All others  reviewed and negative.  Objective        PE:  BP (!) 160/80 (BP Location: Left Arm, Patient Position: Sitting, Cuff Size: Normal)   Pulse 65   Temp 97.7 F (36.5 C) (Oral)   Ht 6' (1.829 m)   Wt 224 lb (101.6 kg)   SpO2 99%   BMI 30.38 kg/m                 Constitutional: Pt appears in NAD               HENT: Head: NCAT.                Right Ear: External ear normal.                 Left Ear: External ear normal.                Eyes: . Pupils are equal, round, and reactive to light. Conjunctivae and EOM are normal               Nose: without d/c or  deformity               Neck: Neck supple. Gross normal ROM               Cardiovascular: Normal rate and regular rhythm.                 Pulmonary/Chest: Effort normal and breath sounds without rales or wheezing.                Abd:  Soft, NT, ND, + BS, no organomegaly               Neurological: Pt is alert. At baseline orientation, motor grossly intact               Skin: Skin is warm. No rashes, no other new lesions, LE edema - none               Psychiatric: Pt behavior is normal without agitation   Micro: none  Cardiac tracings I have personally interpreted today:  none  Pertinent Radiological findings (summarize): none   Lab Results  Component Value Date   WBC 10.3 09/03/2023   HGB 15.1 09/03/2023   HCT 45.4 09/03/2023   PLT 361.0 09/03/2023   GLUCOSE 82 09/03/2023   CHOL 252 (H) 09/03/2023   TRIG 357.0 (H) 09/03/2023   HDL 34.80 (L) 09/03/2023   LDLDIRECT 97.0 02/22/2023   LDLCALC 146 (H) 09/03/2023   ALT 16 09/03/2023   AST 20 09/03/2023   NA 143 09/03/2023   K 3.3 (L) 09/03/2023   CL 100 09/03/2023   CREATININE 1.01 09/03/2023   BUN 12 09/03/2023   CO2 33 (H) 09/03/2023   TSH 1.32 09/03/2023   PSA 4.46 (H) 09/03/2023   INR 1.6 (H) 04/04/2012   HGBA1C 5.9 09/03/2023   MICROALBUR 0.7 09/07/2022   Assessment/Plan:  Marcus Boyd is a 78 y.o. Black or African American [2] male with  has a past medical history of Abnormal CT scan/MRI, gallbladder ? cancer (04/21/2019), Atrial fibrillation, new onset (HCC) (11/16/2011), ERECTILE DYSFUNCTION (04/10/2007), Exertional dyspnea, Flutter-fibrillation (HCC), adenomatous colonic polyps (02/20/2019), Hyperlipemia (11/14/2010), and HYPERTENSION (04/10/2007).  Encounter for well adult exam with abnormal findings Age and sex appropriate education and counseling updated with regular exercise and diet Referrals for preventative services - none needed Immunizations addressed - declines covid booster, flu shot Smoking counseling  - none  needed Evidence for depression or other mood disorder - none significant Most recent labs reviewed. I have personally reviewed and have noted: 1) the patient's medical and social history 2) The patient's current medications and supplements 3) The patient's height, weight, and BMI have been recorded in the chart   Essential hypertension BP Readings from Last 3 Encounters:  09/03/23 (!) 160/80  02/22/23 136/84  09/07/22 128/76   Uncontroleld but controlled at home per pt, declines change in tx,, pt to continue medical treatment norvasc 10 every day, hct 25 every day, lisinopril 40 every day, toprol xl 100 qd   Hyperglycemia Lab Results  Component Value Date   HGBA1C 5.9 09/03/2023   Stable, pt to continue current medical treatment  - diet, wt control   Hyperlipidemia Lab Results  Component Value Date   LDLCALC 146 (H) 09/03/2023   Uncontrolled, pt to rerstart lipitor 20 every day   Vitamin D deficiency Last vitamin D Lab Results  Component Value Date   VD25OH 85.70 09/03/2023   Stable, cont oral replacement   Cerumen impaction Cleared today,  to f/u any worsening symptoms or concerns  Elevated PSA Lab Results  Component Value Date   PSA 4.46 (H) 09/03/2023   PSA 3.66 09/07/2022   PSA 2.44 03/08/2022   Mild elevated, for urology referral  Hypokalemia Low for increased to 2 qd  BPH associated with nocturia Mild to mod, for flomax 1 qd,  to f/u any worsening symptoms or concerns  Followup: Return in about 6 months (around 03/02/2024).  Oliver Barre, MD 09/07/2023 2:04 PM [facility]

## 2023-09-04 ENCOUNTER — Other Ambulatory Visit: Payer: Self-pay | Admitting: Internal Medicine

## 2023-09-04 ENCOUNTER — Encounter: Payer: Self-pay | Admitting: Internal Medicine

## 2023-09-04 DIAGNOSIS — R972 Elevated prostate specific antigen [PSA]: Secondary | ICD-10-CM

## 2023-09-04 MED ORDER — ATORVASTATIN CALCIUM 40 MG PO TABS: 40.0000 mg | ORAL_TABLET | Freq: Every day | ORAL | 3 refills | Status: DC

## 2023-09-04 MED ORDER — POTASSIUM CHLORIDE ER 10 MEQ PO TBCR: 20.0000 meq | EXTENDED_RELEASE_TABLET | Freq: Every day | ORAL | 3 refills | Status: DC

## 2023-09-07 ENCOUNTER — Encounter: Payer: Self-pay | Admitting: Internal Medicine

## 2023-09-07 DIAGNOSIS — H6120 Impacted cerumen, unspecified ear: Secondary | ICD-10-CM | POA: Insufficient documentation

## 2023-09-07 DIAGNOSIS — N401 Enlarged prostate with lower urinary tract symptoms: Secondary | ICD-10-CM | POA: Insufficient documentation

## 2023-09-07 NOTE — Assessment & Plan Note (Signed)
Lab Results  Component Value Date   PSA 4.46 (H) 09/03/2023   PSA 3.66 09/07/2022   PSA 2.44 03/08/2022   Mild elevated, for urology referral

## 2023-09-07 NOTE — Assessment & Plan Note (Signed)
Cleared today,  to f/u any worsening symptoms or concerns

## 2023-09-07 NOTE — Assessment & Plan Note (Signed)
BP Readings from Last 3 Encounters:  09/03/23 (!) 160/80  02/22/23 136/84  09/07/22 128/76   Uncontroleld but controlled at home per pt, declines change in tx,, pt to continue medical treatment norvasc 10 every day, hct 25 every day, lisinopril 40 every day, toprol xl 100 qd

## 2023-09-07 NOTE — Assessment & Plan Note (Signed)
Mild to mod, for flomax 1 qd,  to f/u any worsening symptoms or concerns

## 2023-09-07 NOTE — Assessment & Plan Note (Signed)
Low for increased to 2 qd

## 2023-09-07 NOTE — Assessment & Plan Note (Signed)
Age and sex appropriate education and counseling updated with regular exercise and diet Referrals for preventative services - none needed Immunizations addressed - declines covid booster, flu shot Smoking counseling  - none needed Evidence for depression or other mood disorder - none significant Most recent labs reviewed. I have personally reviewed and have noted: 1) the patient's medical and social history 2) The patient's current medications and supplements 3) The patient's height, weight, and BMI have been recorded in the chart

## 2023-09-07 NOTE — Assessment & Plan Note (Signed)
Lab Results  Component Value Date   HGBA1C 5.9 09/03/2023   Stable, pt to continue current medical treatment  - diet, wt control

## 2023-09-07 NOTE — Assessment & Plan Note (Addendum)
Lab Results  Component Value Date   LDLCALC 146 (H) 09/03/2023   Uncontrolled, pt to rerstart lipitor 20 every day

## 2023-09-07 NOTE — Assessment & Plan Note (Signed)
Last vitamin D Lab Results  Component Value Date   VD25OH 85.70 09/03/2023   Stable, cont oral replacement

## 2024-02-17 ENCOUNTER — Ambulatory Visit (INDEPENDENT_AMBULATORY_CARE_PROVIDER_SITE_OTHER)

## 2024-02-17 VITALS — Ht 72.0 in | Wt 224.0 lb

## 2024-02-17 DIAGNOSIS — Z0000 Encounter for general adult medical examination without abnormal findings: Secondary | ICD-10-CM | POA: Diagnosis not present

## 2024-02-17 NOTE — Progress Notes (Signed)
 Subjective:   Marcus Boyd is a 78 y.o. who presents for a Medicare Wellness preventive visit.  As a reminder, Annual Wellness Visits don't include a physical exam, and some assessments may be limited, especially if this visit is performed virtually. We may recommend an in-person follow-up visit with your provider if needed.  Visit Complete: Virtual I connected with  Marcus Boyd on 02/17/24 by a audio enabled telemedicine application and verified that I am speaking with the correct person using two identifiers.  Patient Location: Home  Provider Location: Home Office  I discussed the limitations of evaluation and management by telemedicine. The patient expressed understanding and agreed to proceed.  Vital Signs: Because this visit was a virtual/telehealth visit, some criteria may be missing or patient reported. Any vitals not documented were not able to be obtained and vitals that have been documented are patient reported.  VideoDeclined- This patient declined Librarian, academic. Therefore the visit was completed with audio only.  Persons Participating in Visit: Patient.  AWV Questionnaire: No: Patient Medicare AWV questionnaire was not completed prior to this visit.  Cardiac Risk Factors include: advanced age (>53men, >83 women);hypertension;Other (see comment);dyslipidemia, Risk factor comments: A-fib     Objective:    Today's Vitals   02/17/24 1503  Weight: 224 lb (101.6 kg)  Height: 6' (1.829 m)   Body mass index is 30.38 kg/m.     02/17/2024    3:10 PM 01/14/2023    4:15 PM 02/02/2022    9:51 AM 01/27/2021    1:32 PM 01/01/2020    8:29 AM 12/25/2018    1:14 PM 12/20/2017    8:25 AM  Advanced Directives  Does Patient Have a Medical Advance Directive? No No No No Yes No No   Does patient want to make changes to medical advance directive?     No - Patient declined    Would patient like information on creating a medical advance directive?   No - Patient declined No - Patient declined No - Patient declined  Yes (ED - Information included in AVS)  No - Patient declined      Data saved with a previous flowsheet row definition    Current Medications (verified) Outpatient Encounter Medications as of 02/17/2024  Medication Sig   amLODipine  (NORVASC ) 10 MG tablet Take 1 tablet (10 mg total) by mouth daily.   aspirin 81 MG tablet Take 81 mg by mouth daily.   atorvastatin  (LIPITOR) 40 MG tablet Take 1 tablet (40 mg total) by mouth daily.   hydrochlorothiazide  (HYDRODIURIL ) 25 MG tablet Take 1 tablet (25 mg total) by mouth daily.   ibuprofen (ADVIL,MOTRIN) 200 MG tablet Take 200 mg by mouth every 6 (six) hours as needed for moderate pain.   lisinopril  (ZESTRIL ) 40 MG tablet TAKE 1 TABLET BY MOUTH EVERY DAY   metoprolol  succinate (TOPROL -XL) 100 MG 24 hr tablet Take with or immediately following a meal one time per day.   Multiple Vitamin (MULTIVITAMIN) tablet Take 1 tablet by mouth daily.   potassium chloride  (KLOR-CON ) 10 MEQ tablet Take 2 tablets (20 mEq total) by mouth daily.   tamsulosin  (FLOMAX ) 0.4 MG CAPS capsule Take 1 capsule (0.4 mg total) by mouth daily.   No facility-administered encounter medications on file as of 02/17/2024.    Allergies (verified) Patient has no known allergies.   History: Past Medical History:  Diagnosis Date   Abnormal CT scan/MRI, gallbladder ? cancer 04/21/2019   Atrial fibrillation, new onset (HCC) 11/16/2011  ERECTILE DYSFUNCTION 04/10/2007   Exertional dyspnea    related to my heart problems   Flutter-fibrillation (HCC)    Hx of adenomatous colonic polyps 02/20/2019   Hyperlipemia 11/14/2010   HYPERTENSION 04/10/2007   Past Surgical History:  Procedure Laterality Date   ATRIAL FLUTTER ABLATION N/A 03/12/2012   Procedure: ATRIAL FLUTTER ABLATION;  Surgeon: Danelle LELON Birmingham, MD;  Location: [facility] CATH LAB;  Service: Cardiovascular;  Laterality: N/A;   COLONOSCOPY  04/22/2008   POLYPECTOMY      RADIOFREQUENCY ABLATION  03/12/12   Family History  Problem Relation Age of Onset   Hypertension Mother    Cancer Father        unsure what kind of cancer   Hypertension Sister        twin   Colon cancer Neg Hx    Colon polyps Neg Hx    Esophageal cancer Neg Hx    Rectal cancer Neg Hx    Stomach cancer Neg Hx    Social History   Socioeconomic History   Marital status: Legally Separated    Spouse name: Not on file   Number of children: 4   Years of education: 12   Highest education level: High school graduate  Occupational History   Occupation: Truck driver/ retired    Associate Professor: [employer]  Tobacco Use   Smoking status: Former    Current packs/day: 0.00    Average packs/day: 1 pack/day for 20.0 years (20.0 ttl pk-yrs)    Types: Cigarettes    Start date: 08/13/1968    Quit date: 08/13/1988    Years since quitting: 35.5   Smokeless tobacco: Never   Tobacco comments:    03/12/12 quit smoking in the 1990's  Vaping Use   Vaping status: Never Used  Substance and Sexual Activity   Alcohol use: Not Currently    Comment: occ.wine or beer per pt-none recently    Drug use: Not Currently    Types: Marijuana    Comment: 03/12/12 last marijuana was oversees; 1970's   Sexual activity: Yes  Other Topics Concern   Not on file  Social History Narrative   Lives alone/2025   Social Drivers of Health   Financial Resource Strain: Medium Risk (02/17/2024)   Overall Financial Resource Strain (CARDIA)    Difficulty of Paying Living Expenses: Somewhat hard  Food Insecurity: No Food Insecurity (02/17/2024)   Hunger Vital Sign    Worried About Running Out of Food in the Last Year: Never true    Ran Out of Food in the Last Year: Never true  Transportation Needs: No Transportation Needs (02/17/2024)   PRAPARE - Administrator, Civil Service (Medical): No    Lack of Transportation (Non-Medical): No  Physical Activity: Inactive (02/17/2024)   Exercise Vital Sign    Days of  Exercise per Week: 0 days    Minutes of Exercise per Session: 0 min  Stress: No Stress Concern Present (02/17/2024)   [employer] of Occupational Health - Occupational Stress Questionnaire    Feeling of Stress: Not at all  Social Connections: Socially Isolated (02/17/2024)   Social Connection and Isolation Panel    Frequency of Communication with Friends and Family: Three times a week    Frequency of Social Gatherings with Friends and Family: More than three times a week    Attends Religious Services: Never    Database administrator or Organizations: No    Attends Banker Meetings: Never  Marital Status: Separated    Tobacco Counseling Counseling given: Not Answered Tobacco comments: 03/12/12 quit smoking in the 1990's    Clinical Intake:  Pre-visit preparation completed: Yes  Pain : No/denies pain     BMI - recorded: 30.38 Nutritional Status: BMI > 30  Obese Nutritional Risks: None Diabetes: No  Lab Results  Component Value Date   HGBA1C 5.9 09/03/2023   HGBA1C 5.7 02/22/2023   HGBA1C 5.8 09/07/2022     How often do you need to have someone help you when you read instructions, pamphlets, or other written materials from your doctor or pharmacy?: 1 - Never  Interpreter Needed?: No  Information entered by :: Emmersen Garraway, RMA   Activities of Daily Living     02/17/2024    3:07 PM  In your present state of health, do you have any difficulty performing the following activities:  Hearing? 0  Vision? 0  Difficulty concentrating or making decisions? 0  Walking or climbing stairs? 0  Dressing or bathing? 0  Doing errands, shopping? 0  Preparing Food and eating ? N  Using the Toilet? N  In the past six months, have you accidently leaked urine? N  Do you have problems with loss of bowel control? N  Managing your Medications? N  Managing your Finances? N  Housekeeping or managing your Housekeeping? N    Patient Care Team: Norleen Lynwood ORN, MD as  PCP - General Waddell Danelle ORN, MD as Consulting Physician (Cardiology) Avram Lupita BRAVO, MD as Consulting Physician (Gastroenterology) Vernetta Berg, MD as Consulting Physician (General Surgery) Cloretta Arley NOVAK, MD as Consulting Physician (Oncology) Harrietta Kurtz, OD as Consulting Physician (Optometry)  I have updated your Care Teams any recent Medical Services you may have received from other providers in the past year.     Assessment:   This is a routine wellness examination for Marcus Boyd.  Hearing/Vision screen Hearing Screening - Comments:: Denies hearing difficulties   Vision Screening - Comments:: Wears eyeglasses/ [facility]   Goals Addressed             This Visit's Progress    MY GOAL FOR 2024 IS TO MAINTAIN MY HEALTH.   On track      Depression Screen     02/17/2024    3:14 PM 09/03/2023    8:33 AM 02/22/2023    9:21 AM 01/14/2023    4:14 PM 09/07/2022    9:22 AM 09/07/2022    9:09 AM 03/08/2022    8:40 AM  PHQ 2/9 Scores  PHQ - 2 Score 0 0 0 0 0 0 0  PHQ- 9 Score 2  1 0  0     Fall Risk     02/17/2024    3:10 PM 09/03/2023    8:33 AM 02/22/2023    9:21 AM 01/14/2023    4:15 PM 09/07/2022    9:22 AM  Fall Risk   Falls in the past year? 0 0 0 0 0  Number falls in past yr: 0 0 0 0 0  Injury with Fall? 0 0 0 0 0  Risk for fall due to :  No Fall Risks No Fall Risks No Fall Risks   Follow up Falls evaluation completed;Falls prevention discussed Falls evaluation completed Falls evaluation completed Falls prevention discussed     MEDICARE RISK AT HOME:  Medicare Risk at Home Any stairs in or around the home?: Yes If so, are there any without handrails?: No Home free of  loose throw rugs in walkways, pet beds, electrical cords, etc?: Yes Adequate lighting in your home to reduce risk of falls?: Yes Life alert?: No Use of a cane, walker or w/c?: No Grab bars in the bathroom?: No Shower chair or bench in shower?: No Elevated toilet seat or a  handicapped toilet?: No  TIMED UP AND GO:  Was the test performed?  No  Cognitive Function: Declined/Normal: No cognitive concerns noted by patient or family. Patient alert, oriented, able to answer questions appropriately and recall recent events. No signs of memory loss or confusion.    12/20/2017    8:31 AM  MMSE - Mini Mental State Exam  Orientation to time 5  Orientation to Place 5  Registration 3  Attention/ Calculation 5  Recall 2  Language- name 2 objects 2  Language- repeat 1  Language- follow 3 step command 3  Language- read & follow direction 1  Write a sentence 1  Copy design 1  Total score 29        01/14/2023    4:19 PM 02/02/2022    9:37 AM  6CIT Screen  What Year? 0 points 0 points  What month? 0 points 0 points  What time? 0 points 0 points  Count back from 20 0 points 0 points  Months in reverse 0 points 0 points  Repeat phrase 0 points 0 points  Total Score 0 points 0 points    Immunizations Immunization History  Administered Date(s) Administered   Fluad Quad(high Dose 65+) 09/07/2022   Influenza, High Dose Seasonal PF 06/05/2021   Influenza-Unspecified 06/13/2018, 06/13/2020   PFIZER Comirnaty(Gray Top)Covid-19 Tri-Sucrose Vaccine 09/05/2020   PFIZER(Purple Top)SARS-COV-2 Vaccination 09/18/2019, 10/09/2019, 09/05/2020   Pfizer Covid-19 Vaccine Bivalent Booster 96yrs & up 06/05/2021   Pneumococcal Conjugate-13 11/27/2013   Pneumococcal Polysaccharide-23 12/14/2016   Td 08/13/2004   Tdap 12/09/2015   Varicella Zoster Immune Globulin 11/14/2010    Screening Tests Health Maintenance  Topic Date Due   COVID-19 Vaccine (6 - 2024-25 season) 04/14/2023   INFLUENZA VACCINE  03/13/2024   Medicare Annual Wellness (AWV)  02/16/2025   DTaP/Tdap/Td (3 - Td or Tdap) 12/08/2025   Pneumococcal Vaccine: 50+ Years  Completed   Hepatitis C Screening  Completed   Hepatitis B Vaccines  Aged Out   HPV VACCINES  Aged Out   Meningococcal B Vaccine  Aged Out    Colonoscopy  Discontinued   Zoster Vaccines- Shingrix  Discontinued    Health Maintenance  Health Maintenance Due  Topic Date Due   COVID-19 Vaccine (6 - 2024-25 season) 04/14/2023   Health Maintenance Items Addressed: See Nurse Notes at the end of this note  Additional Screening:  Vision Screening: Recommended annual ophthalmology exams for early detection of glaucoma and other disorders of the eye. Would you like a referral to an eye doctor? No    Dental Screening: Recommended annual dental exams for proper oral hygiene  Community Resource Referral / Chronic Care Management: CRR required this visit?  No   CCM required this visit?  No   Plan:    I have personally reviewed and noted the following in the patient's chart:   Medical and social history Use of alcohol, tobacco or illicit drugs  Current medications and supplements including opioid prescriptions. Patient is not currently taking opioid prescriptions. Functional ability and status Nutritional status Physical activity Advanced directives List of other physicians Hospitalizations, surgeries, and ER visits in previous 12 months Vitals Screenings to include cognitive, depression, and  falls Referrals and appointments  In addition, I have reviewed and discussed with patient certain preventive protocols, quality metrics, and best practice recommendations. A written personalized care plan for preventive services as well as general preventive health recommendations were provided to patient.   Ahriana Gunkel L Mashawn Brazil, CMA   02/17/2024   After Visit Summary: (Mail) Due to this being a telephonic visit, the after visit summary with patients personalized plan was offered to patient via mail   Notes: Patient is up to date on all health maintenance with no concerns to address today.

## 2024-02-17 NOTE — Patient Instructions (Signed)
 Mr. Angert , Thank you for taking time out of your busy schedule to complete your Annual Wellness Visit with me. I enjoyed our conversation and look forward to speaking with you again next year. I, as well as your care team,  appreciate your ongoing commitment to your health goals. Please review the following plan we discussed and let me know if I can assist you in the future. Your Game plan/ To Do List   Follow up Visits: Next Medicare AWV with our clinical staff: 02/18/2025.   Have you seen your provider in the last 6 months (3 months if uncontrolled diabetes)? No Next Office Visit with your provider: 03/02/2024.  Clinician Recommendations:  Aim for 30 minutes of exercise or brisk walking, 6-8 glasses of water , and 5 servings of fruits and vegetables each day. Remember to pick up some Ensure when you come for your office visit.        This is a list of the screening recommended for you and due dates:  Health Maintenance  Topic Date Due   COVID-19 Vaccine (6 - 2024-25 season) 04/14/2023   Flu Shot  03/13/2024   Medicare Annual Wellness Visit  02/16/2025   DTaP/Tdap/Td vaccine (3 - Td or Tdap) 12/08/2025   Pneumococcal Vaccine for age over 40  Completed   Hepatitis C Screening  Completed   Hepatitis B Vaccine  Aged Out   HPV Vaccine  Aged Out   Meningitis B Vaccine  Aged Out   Colon Cancer Screening  Discontinued   Zoster (Shingles) Vaccine  Discontinued    Advanced directives: (Declined) Advance directive discussed with you today. Even though you declined this today, please call our office should you change your mind, and we can give you the proper paperwork for you to fill out. Advance Care Planning is important because it:  [x]  Makes sure you receive the medical care that is consistent with your values, goals, and preferences  [x]  It provides guidance to your family and loved ones and reduces their decisional burden about whether or not they are making the right decisions based on  your wishes.  Follow the link provided in your after visit summary or read over the paperwork we have mailed to you to help you started getting your Advance Directives in place. If you need assistance in completing these, please reach out to us  so that we can help you!  See attachments for Preventive Care and Fall Prevention Tips.

## 2024-03-02 ENCOUNTER — Ambulatory Visit: Payer: Self-pay | Admitting: Internal Medicine

## 2024-03-02 ENCOUNTER — Ambulatory Visit (INDEPENDENT_AMBULATORY_CARE_PROVIDER_SITE_OTHER): Payer: Medicare Other | Admitting: Internal Medicine

## 2024-03-02 ENCOUNTER — Encounter: Payer: Self-pay | Admitting: Internal Medicine

## 2024-03-02 VITALS — BP 102/58 | HR 56 | Temp 98.0°F | Ht 72.0 in | Wt 226.0 lb

## 2024-03-02 DIAGNOSIS — E7800 Pure hypercholesterolemia, unspecified: Secondary | ICD-10-CM

## 2024-03-02 DIAGNOSIS — E559 Vitamin D deficiency, unspecified: Secondary | ICD-10-CM

## 2024-03-02 DIAGNOSIS — I10 Essential (primary) hypertension: Secondary | ICD-10-CM

## 2024-03-02 DIAGNOSIS — R739 Hyperglycemia, unspecified: Secondary | ICD-10-CM

## 2024-03-02 LAB — BASIC METABOLIC PANEL WITH GFR
BUN: 11 mg/dL (ref 6–23)
CO2: 35 meq/L — ABNORMAL HIGH (ref 19–32)
Calcium: 9.3 mg/dL (ref 8.4–10.5)
Chloride: 102 meq/L (ref 96–112)
Creatinine, Ser: 1.26 mg/dL (ref 0.40–1.50)
GFR: 55.33 mL/min — ABNORMAL LOW (ref >60.00)
Glucose, Bld: 100 mg/dL — ABNORMAL HIGH (ref 70–99)
Potassium: 3.7 meq/L (ref 3.5–5.1)
Sodium: 142 meq/L (ref 135–145)

## 2024-03-02 LAB — HEPATIC FUNCTION PANEL
ALT: 16 U/L (ref 0–53)
AST: 21 U/L (ref 0–37)
Albumin: 4 g/dL (ref 3.5–5.2)
Alkaline Phosphatase: 50 U/L (ref 39–117)
Bilirubin, Direct: 0.1 mg/dL (ref 0.0–0.3)
Total Bilirubin: 0.5 mg/dL (ref 0.2–1.2)
Total Protein: 7.1 g/dL (ref 6.0–8.3)

## 2024-03-02 LAB — LIPID PANEL
Cholesterol: 149 mg/dL (ref 0–200)
HDL: 30 mg/dL — ABNORMAL LOW (ref >39.00)
LDL Cholesterol: 78 mg/dL (ref 0–99)
NonHDL: 119.35
Total CHOL/HDL Ratio: 5
Triglycerides: 205 mg/dL — ABNORMAL HIGH (ref 0.0–149.0)
VLDL: 41 mg/dL — ABNORMAL HIGH (ref 0.0–40.0)

## 2024-03-02 LAB — VITAMIN D 25 HYDROXY (VIT D DEFICIENCY, FRACTURES): VITD: 89.2 ng/mL (ref 30.00–100.00)

## 2024-03-02 LAB — HEMOGLOBIN A1C: Hgb A1c MFr Bld: 5.9 % (ref 4.6–6.5)

## 2024-03-02 NOTE — Progress Notes (Signed)
 The test results show that your current treatment is OK, as the tests are stable.  Please continue the same plan.  There is no other need for change of treatment or further evaluation based on these results, at this time.  thanks

## 2024-03-02 NOTE — Patient Instructions (Signed)

## 2024-03-02 NOTE — Assessment & Plan Note (Signed)
 Last vitamin D  Lab Results  Component Value Date   VD25OH 89.20 03/02/2024   Stable, cont oral replacement

## 2024-03-02 NOTE — Assessment & Plan Note (Addendum)
 BP Readings from Last 3 Encounters:  03/02/24 (!) 102/58  09/03/23 (!) 160/80  02/22/23 136/84   Low normal, asymptomatic,  pt to continue medical treatment norvasc  10 every day, hct 25, every day, lisinopril  40 every day, toprol  xl 100 every day, declines other change

## 2024-03-02 NOTE — Assessment & Plan Note (Signed)
 Lab Results  Component Value Date   HGBA1C 5.9 03/02/2024   Stable, pt to continue current medical treatment  - diet, wt control

## 2024-03-02 NOTE — Progress Notes (Signed)
 Patient ID: Marcus Boyd, male   DOB: 01-Sep-1945, 78 y.o.   MRN: [id number]        Chief Complaint: follow up HTN, HLD and hyperglycemia , low vit d       HPI:  Marcus Boyd is a 78 y.o. male here overall doing ok, Pt denies chest pain, increased sob or doe, wheezing, orthopnea, PND, increased LE swelling, palpitations, dizziness or syncope.   Pt denies polydipsia, polyuria, or new focal neuro s/s.    Pt denies fever, wt loss, night sweats, loss of appetite, or other constitutional symptoms      Wt Readings from Last 3 Encounters:  03/02/24 226 lb (102.5 kg)  02/17/24 224 lb (101.6 kg)  09/03/23 224 lb (101.6 kg)   BP Readings from Last 3 Encounters:  03/02/24 (!) 102/58  09/03/23 (!) 160/80  02/22/23 136/84         Past Medical History:  Diagnosis Date   Abnormal CT scan/MRI, gallbladder ? cancer 04/21/2019   Atrial fibrillation, new onset (HCC) 11/16/2011   ERECTILE DYSFUNCTION 04/10/2007   Exertional dyspnea    related to my heart problems   Flutter-fibrillation (HCC)    Hx of adenomatous colonic polyps 02/20/2019   Hyperlipemia 11/14/2010   HYPERTENSION 04/10/2007   Past Surgical History:  Procedure Laterality Date   ATRIAL FLUTTER ABLATION N/A 03/12/2012   Procedure: ATRIAL FLUTTER ABLATION;  Surgeon: Danelle LELON Birmingham, MD;  Location: [facility] CATH LAB;  Service: Cardiovascular;  Laterality: N/A;   COLONOSCOPY  04/22/2008   POLYPECTOMY     RADIOFREQUENCY ABLATION  03/12/12    reports that he quit smoking about 35 years ago. His smoking use included cigarettes. He started smoking about 55 years ago. He has a 20 pack-year smoking history. He has never used smokeless tobacco. He reports that he does not currently use alcohol. He reports that he does not currently use drugs after having used the following drugs: Marijuana. family history includes Cancer in his father; Hypertension in his mother and sister. No Known Allergies Current Outpatient Medications on File Prior to Visit   Medication Sig Dispense Refill   amLODipine  (NORVASC ) 10 MG tablet Take 1 tablet (10 mg total) by mouth daily. 90 tablet 3   aspirin 81 MG tablet Take 81 mg by mouth daily.     atorvastatin  (LIPITOR) 40 MG tablet Take 1 tablet (40 mg total) by mouth daily. 90 tablet 3   hydrochlorothiazide  (HYDRODIURIL ) 25 MG tablet Take 1 tablet (25 mg total) by mouth daily. 90 tablet 3   ibuprofen (ADVIL,MOTRIN) 200 MG tablet Take 200 mg by mouth every 6 (six) hours as needed for moderate pain.     lisinopril  (ZESTRIL ) 40 MG tablet TAKE 1 TABLET BY MOUTH EVERY DAY 90 tablet 3   metoprolol  succinate (TOPROL -XL) 100 MG 24 hr tablet Take with or immediately following a meal one time per day. 90 tablet 3   Multiple Vitamin (MULTIVITAMIN) tablet Take 1 tablet by mouth daily.     potassium chloride  (KLOR-CON ) 10 MEQ tablet Take 2 tablets (20 mEq total) by mouth daily. 180 tablet 3   tamsulosin  (FLOMAX ) 0.4 MG CAPS capsule Take 1 capsule (0.4 mg total) by mouth daily. 90 capsule 3   No current facility-administered medications on file prior to visit.        ROS:  All others reviewed and negative.  Objective        PE:  BP (!) 102/58   Pulse (!) 56   Temp 98  F (36.7 C)   Ht 6' (1.829 m)   Wt 226 lb (102.5 kg)   SpO2 96%   BMI 30.65 kg/m                 Constitutional: Pt appears in NAD               HENT: Head: NCAT.                Right Ear: External ear normal.                 Left Ear: External ear normal.                Eyes: . Pupils are equal, round, and reactive to light. Conjunctivae and EOM are normal               Nose: without d/c or deformity               Neck: Neck supple. Gross normal ROM               Cardiovascular: Normal rate and regular rhythm.                 Pulmonary/Chest: Effort normal and breath sounds without rales or wheezing.                Abd:  Soft, NT, ND, + BS, no organomegaly               Neurological: Pt is alert. At baseline orientation, motor grossly intact                Skin: Skin is warm. No rashes, no other new lesions, LE edema - none               Psychiatric: Pt behavior is normal without agitation   Micro: none  Cardiac tracings I have personally interpreted today:  none  Pertinent Radiological findings (summarize): none   Lab Results  Component Value Date   WBC 10.3 09/03/2023   HGB 15.1 09/03/2023   HCT 45.4 09/03/2023   PLT 361.0 09/03/2023   GLUCOSE 100 (H) 03/02/2024   CHOL 149 03/02/2024   TRIG 205.0 (H) 03/02/2024   HDL 30.00 (L) 03/02/2024   LDLDIRECT 97.0 02/22/2023   LDLCALC 78 03/02/2024   ALT 16 03/02/2024   AST 21 03/02/2024   NA 142 03/02/2024   K 3.7 03/02/2024   CL 102 03/02/2024   CREATININE 1.26 03/02/2024   BUN 11 03/02/2024   CO2 35 (H) 03/02/2024   TSH 1.32 09/03/2023   PSA 4.46 (H) 09/03/2023   INR 1.6 (H) 04/04/2012   HGBA1C 5.9 03/02/2024   Assessment/Plan:  Marcus Boyd is a 78 y.o. Black or African American [2] male with  has a past medical history of Abnormal CT scan/MRI, gallbladder ? cancer (04/21/2019), Atrial fibrillation, new onset (HCC) (11/16/2011), ERECTILE DYSFUNCTION (04/10/2007), Exertional dyspnea, Flutter-fibrillation (HCC), adenomatous colonic polyps (02/20/2019), Hyperlipemia (11/14/2010), and HYPERTENSION (04/10/2007).  Essential hypertension BP Readings from Last 3 Encounters:  03/02/24 (!) 102/58  09/03/23 (!) 160/80  02/22/23 136/84   Low normal, asymptomatic,  pt to continue medical treatment norvasc  10 every day, hct 25, every day, lisinopril  40 every day, toprol  xl 100 every day, declines other change   Hyperglycemia Lab Results  Component Value Date   HGBA1C 5.9 03/02/2024   Stable, pt to continue current medical treatment  - diet, wt control   Hyperlipidemia Lab Results  Component  Value Date   LDLCALC 78 03/02/2024   uncontrolled, pt to continue current statin lipitor 40 mg every day, declines other change for now   Vitamin D  deficiency Last vitamin D  Lab  Results  Component Value Date   VD25OH 89.20 03/02/2024   Stable, cont oral replacement  Followup: Return in about 6 months (around 09/02/2024).  Marcus Rush, MD 03/02/2024 8:03 PM [facility]

## 2024-03-02 NOTE — Assessment & Plan Note (Signed)
 Lab Results  Component Value Date   LDLCALC 78 03/02/2024   uncontrolled, pt to continue current statin lipitor 40 mg every day, declines other change for now

## 2024-06-17 ENCOUNTER — Telehealth: Payer: Self-pay | Admitting: Pharmacist

## 2024-06-17 NOTE — Progress Notes (Signed)
 Pharmacy Quality Measure Review  This patient is appearing on a report for being at risk of failing the adherence measure for cholesterol (statin) medications this calendar year.   Medication: atorvastatin  Last fill date: 12/06/23 for 90 day supply  Spoke to patient regarding atorvastatin . Pt reports he is taking as prescribed. Per fill history, it appears last atorvastatin  filled was for 20 mg, however pts med list has 40 mg. Pt would like to get a refill of 40 mg atorvastatin . Contacted pharmacy to facilitate refills.   Darrelyn Drum, PharmD, BCPS, CPP Clinical Pharmacist Practitioner [facility] at [facility] [phone]

## 2024-09-01 ENCOUNTER — Encounter: Admitting: Family Medicine

## 2024-09-07 ENCOUNTER — Ambulatory Visit: Admitting: Internal Medicine

## 2024-09-08 ENCOUNTER — Encounter: Payer: Self-pay | Admitting: Internal Medicine

## 2024-09-08 ENCOUNTER — Ambulatory Visit: Payer: Self-pay | Admitting: Internal Medicine

## 2024-09-08 ENCOUNTER — Ambulatory Visit: Admitting: Internal Medicine

## 2024-09-08 VITALS — BP 130/84 | HR 82 | Temp 97.9°F | Ht 72.0 in | Wt 221.0 lb

## 2024-09-08 DIAGNOSIS — Z0000 Encounter for general adult medical examination without abnormal findings: Secondary | ICD-10-CM

## 2024-09-08 DIAGNOSIS — E7800 Pure hypercholesterolemia, unspecified: Secondary | ICD-10-CM

## 2024-09-08 DIAGNOSIS — R972 Elevated prostate specific antigen [PSA]: Secondary | ICD-10-CM | POA: Diagnosis not present

## 2024-09-08 DIAGNOSIS — I10 Essential (primary) hypertension: Secondary | ICD-10-CM

## 2024-09-08 DIAGNOSIS — R739 Hyperglycemia, unspecified: Secondary | ICD-10-CM | POA: Diagnosis not present

## 2024-09-08 DIAGNOSIS — E559 Vitamin D deficiency, unspecified: Secondary | ICD-10-CM | POA: Diagnosis not present

## 2024-09-08 LAB — LIPID PANEL
Cholesterol: 140 mg/dL (ref 28–200)
HDL: 33 mg/dL — ABNORMAL LOW
LDL Cholesterol: 80 mg/dL (ref 10–99)
NonHDL: 106.69
Total CHOL/HDL Ratio: 4
Triglycerides: 133 mg/dL (ref 10.0–149.0)
VLDL: 26.6 mg/dL (ref 0.0–40.0)

## 2024-09-08 LAB — CBC WITH DIFFERENTIAL/PLATELET
Basophils Absolute: 0 10*3/uL (ref 0.0–0.1)
Basophils Relative: 0.2 % (ref 0.0–3.0)
Eosinophils Absolute: 0.4 10*3/uL (ref 0.0–0.7)
Eosinophils Relative: 4.1 % (ref 0.0–5.0)
HCT: 41.9 % (ref 39.0–52.0)
Hemoglobin: 14 g/dL (ref 13.0–17.0)
Lymphocytes Relative: 29.8 % (ref 12.0–46.0)
Lymphs Abs: 2.9 10*3/uL (ref 0.7–4.0)
MCHC: 33.4 g/dL (ref 30.0–36.0)
MCV: 85.7 fl (ref 78.0–100.0)
Monocytes Absolute: 0.8 10*3/uL (ref 0.1–1.0)
Monocytes Relative: 8.2 % (ref 3.0–12.0)
Neutro Abs: 5.6 10*3/uL (ref 1.4–7.7)
Neutrophils Relative %: 57.7 % (ref 43.0–77.0)
Platelets: 309 10*3/uL (ref 150.0–400.0)
RBC: 4.89 Mil/uL (ref 4.22–5.81)
RDW: 15.4 % (ref 11.5–15.5)
WBC: 9.6 10*3/uL (ref 4.0–10.5)

## 2024-09-08 LAB — BASIC METABOLIC PANEL WITH GFR
BUN: 14 mg/dL (ref 6–23)
CO2: 32 meq/L (ref 19–32)
Calcium: 9.1 mg/dL (ref 8.4–10.5)
Chloride: 103 meq/L (ref 96–112)
Creatinine, Ser: 1.07 mg/dL (ref 0.40–1.50)
GFR: 67.07 mL/min
Glucose, Bld: 110 mg/dL — ABNORMAL HIGH (ref 70–99)
Potassium: 3.4 meq/L — ABNORMAL LOW (ref 3.5–5.1)
Sodium: 142 meq/L (ref 135–145)

## 2024-09-08 LAB — HEPATIC FUNCTION PANEL
ALT: 18 U/L (ref 3–53)
AST: 23 U/L (ref 5–37)
Albumin: 4.1 g/dL (ref 3.5–5.2)
Alkaline Phosphatase: 61 U/L (ref 39–117)
Bilirubin, Direct: 0 mg/dL — ABNORMAL LOW (ref 0.1–0.3)
Total Bilirubin: 0.3 mg/dL (ref 0.2–1.2)
Total Protein: 7.5 g/dL (ref 6.0–8.3)

## 2024-09-08 LAB — URINALYSIS, ROUTINE W REFLEX MICROSCOPIC
Bilirubin Urine: NEGATIVE
Hgb urine dipstick: NEGATIVE
Ketones, ur: NEGATIVE
Leukocytes,Ua: NEGATIVE
Nitrite: NEGATIVE
RBC / HPF: NONE SEEN
Specific Gravity, Urine: 1.025 (ref 1.000–1.030)
Total Protein, Urine: NEGATIVE
Urine Glucose: NEGATIVE
Urobilinogen, UA: 0.2 (ref 0.0–1.0)
pH: 5.5 (ref 5.0–8.0)

## 2024-09-08 LAB — TSH: TSH: 1.23 u[IU]/mL (ref 0.35–5.50)

## 2024-09-08 LAB — HEMOGLOBIN A1C: Hgb A1c MFr Bld: 5.8 % (ref 4.6–6.5)

## 2024-09-08 MED ORDER — POTASSIUM CHLORIDE ER 10 MEQ PO TBCR: 20.0000 meq | EXTENDED_RELEASE_TABLET | Freq: Every day | ORAL | 3 refills | Status: AC

## 2024-09-08 MED ORDER — ATORVASTATIN CALCIUM 40 MG PO TABS: 40.0000 mg | ORAL_TABLET | Freq: Every day | ORAL | 3 refills | Status: AC

## 2024-09-08 MED ORDER — METOPROLOL SUCCINATE ER 100 MG PO TB24: ORAL_TABLET | ORAL | 3 refills | Status: AC

## 2024-09-08 MED ORDER — TAMSULOSIN HCL 0.4 MG PO CAPS: 0.4000 mg | ORAL_CAPSULE | Freq: Every day | ORAL | 3 refills | Status: AC

## 2024-09-08 MED ORDER — HYDROCHLOROTHIAZIDE 25 MG PO TABS: 25.0000 mg | ORAL_TABLET | Freq: Every day | ORAL | 3 refills | Status: AC

## 2024-09-08 MED ORDER — AMLODIPINE BESYLATE 10 MG PO TABS: 10.0000 mg | ORAL_TABLET | Freq: Every day | ORAL | 3 refills | Status: AC

## 2024-09-08 MED ORDER — LISINOPRIL 40 MG PO TABS: ORAL_TABLET | ORAL | 3 refills | Status: AC

## 2024-09-08 NOTE — Assessment & Plan Note (Signed)
Age and sex appropriate education and counseling updated with regular exercise and diet Referrals for preventative services - none needed Immunizations addressed - declines flu shot Smoking counseling  - none needed Evidence for depression or other mood disorder - none significant Most recent labs reviewed. I have personally reviewed and have noted: 1) the patient's medical and social history 2) The patient's current medications and supplements 3) The patient's height, weight, and BMI have been recorded in the chart  

## 2024-09-08 NOTE — Assessment & Plan Note (Signed)
 BP Readings from Last 3 Encounters:  09/08/24 130/84  03/02/24 (!) 102/58  09/03/23 (!) 160/80   Stable, pt to continue medical treatment norvasc  10 every day, hct 25 every day, toprol  xl 100 every day, lisinopril  40 qd

## 2024-09-08 NOTE — Progress Notes (Signed)
 Patient ID: Marcus Boyd, male   DOB: May 13, 1946, 79 y.o.   MRN: [id number]         Chief Complaint:: wellness exam and elevated PSA, htn, hyperglycemia, hld, low vit d        HPI:  Marcus Boyd is a 79 y.o. male here for wellness exam; declines flu shot, o/w up to date                        Also Pt denies chest pain, increased sob or doe, wheezing, orthopnea, PND, increased LE swelling, palpitations, dizziness or syncope.   Pt denies polydipsia, polyuria, or new focal neuro s/s.    Pt denies fever, wt loss, night sweats, loss of appetite, or other constitutional symptoms  Did see urology after last elevated PSA and no biopsy needed.     Wt Readings from Last 3 Encounters:  09/08/24 221 lb (100.2 kg)  03/02/24 226 lb (102.5 kg)  02/17/24 224 lb (101.6 kg)   BP Readings from Last 3 Encounters:  09/08/24 130/84  03/02/24 (!) 102/58  09/03/23 (!) 160/80   Immunization History  Administered Date(s) Administered   Fluad Quad(high Dose 65+) 09/07/2022   INFLUENZA, HIGH DOSE SEASONAL PF 06/05/2021   Influenza-Unspecified 06/13/2018, 06/13/2020   PFIZER Comirnaty(Gray Top)Covid-19 Tri-Sucrose Vaccine 09/05/2020   PFIZER(Purple Top)SARS-COV-2 Vaccination 09/18/2019, 10/09/2019, 09/05/2020   Pfizer Covid-19 Vaccine Bivalent Booster 35yrs & up 06/05/2021   Pneumococcal Conjugate-13 11/27/2013   Pneumococcal Polysaccharide-23 12/14/2016   Td 08/13/2004   Tdap 12/09/2015   Varicella Zoster Immune Globulin 11/14/2010   There are no preventive care reminders to display for this patient.     Past Medical History:  Diagnosis Date   Abnormal CT scan/MRI, gallbladder ? cancer 04/21/2019   Atrial fibrillation, new onset (HCC) 11/16/2011   ERECTILE DYSFUNCTION 04/10/2007   Exertional dyspnea    related to my heart problems   Flutter-fibrillation (HCC)    Hx of adenomatous colonic polyps 02/20/2019   Hyperlipemia 11/14/2010   HYPERTENSION 04/10/2007   Past Surgical History:  Procedure  Laterality Date   ATRIAL FLUTTER ABLATION N/A 03/12/2012   Procedure: ATRIAL FLUTTER ABLATION;  Surgeon: Danelle LELON Birmingham, MD;  Location: [facility] CATH LAB;  Service: Cardiovascular;  Laterality: N/A;   COLONOSCOPY  04/22/2008   POLYPECTOMY     RADIOFREQUENCY ABLATION  03/12/12    reports that he quit smoking about 36 years ago. His smoking use included cigarettes. He started smoking about 56 years ago. He has a 20 pack-year smoking history. He has never used smokeless tobacco. He reports that he does not currently use alcohol. He reports that he does not currently use drugs after having used the following drugs: Marijuana. family history includes Cancer in his father; Hypertension in his mother and sister. Allergies[1] Medications Ordered Prior to Encounter[2]      ROS:  All others reviewed and negative.  Objective        PE:  BP 130/84 (BP Location: Right Arm, Patient Position: Sitting, Cuff Size: Normal)   Pulse 82   Temp 97.9 F (36.6 C) (Oral)   Ht 6' (1.829 m)   Wt 221 lb (100.2 kg)   SpO2 97%   BMI 29.97 kg/m                 Constitutional: Pt appears in NAD               HENT: Head: NCAT.  Right Ear: External ear normal.                 Left Ear: External ear normal.                Eyes: . Pupils are equal, round, and reactive to light. Conjunctivae and EOM are normal               Nose: without d/c or deformity               Neck: Neck supple. Gross normal ROM               Cardiovascular: Normal rate and regular rhythm.                 Pulmonary/Chest: Effort normal and breath sounds without rales or wheezing.                Abd:  Soft, NT, ND, + BS, no organomegaly               Neurological: Pt is alert. At baseline orientation, motor grossly intact               Skin: Skin is warm. No rashes, no other new lesions, LE edema - none               Psychiatric: Pt behavior is normal without agitation   Micro: none  Cardiac tracings I have personally interpreted  today:  none  Pertinent Radiological findings (summarize): none   Lab Results  Component Value Date   WBC 9.6 09/08/2024   HGB 14.0 09/08/2024   HCT 41.9 09/08/2024   PLT 309.0 09/08/2024   GLUCOSE 110 (H) 09/08/2024   CHOL 140 09/08/2024   TRIG 133.0 09/08/2024   HDL 33.00 (L) 09/08/2024   LDLDIRECT 97.0 02/22/2023   LDLCALC 80 09/08/2024   ALT 18 09/08/2024   AST 23 09/08/2024   NA 142 09/08/2024   K 3.4 (L) 09/08/2024   CL 103 09/08/2024   CREATININE 1.07 09/08/2024   BUN 14 09/08/2024   CO2 32 09/08/2024   TSH 1.23 09/08/2024   PSA 4.46 (H) 09/03/2023   INR 1.6 (H) 04/04/2012   HGBA1C 5.8 09/08/2024   Assessment/Plan:  Marcus Boyd is a 79 y.o. Black or African American [2] male with  has a past medical history of Abnormal CT scan/MRI, gallbladder ? cancer (04/21/2019), Atrial fibrillation, new onset (HCC) (11/16/2011), ERECTILE DYSFUNCTION (04/10/2007), Exertional dyspnea, Flutter-fibrillation (HCC), adenomatous colonic polyps (02/20/2019), Hyperlipemia (11/14/2010), and HYPERTENSION (04/10/2007).  Preventative health care Age and sex appropriate education and counseling updated with regular exercise and diet Referrals for preventative services - none needed Immunizations addressed - declines flu shot Smoking counseling  - none needed Evidence for depression or other mood disorder - none significant Most recent labs reviewed. I have personally reviewed and have noted: 1) the patient's medical and social history 2) The patient's current medications and supplements 3) The patient's height, weight, and BMI have been recorded in the chart   Essential hypertension BP Readings from Last 3 Encounters:  09/08/24 130/84  03/02/24 (!) 102/58  09/03/23 (!) 160/80   Stable, pt to continue medical treatment norvasc  10 every day, hct 25 every day, toprol  xl 100 every day, lisinopril  40 qd   Hyperglycemia Lab Results  Component Value Date   HGBA1C 5.8 09/08/2024   Stable,  pt to continue current medical treatment  - diet, wt control   Hyperlipidemia Lab Results  Component  Value Date   LDLCALC 80 09/08/2024   Stable, pt to continue current statin lipitor 40 mg qd   Vitamin D  deficiency Last vitamin D  Lab Results  Component Value Date   VD25OH 89.20 03/02/2024   Stable, cont oral replacement   Elevated PSA Lab Results  Component Value Date   PSA 4.46 (H) 09/03/2023   PSA 3.66 09/07/2022   PSA 2.44 03/08/2022   No biopsy needed per pt having seen urology recently,  to f/u any worsening symptoms or concerns  Followup: Return in about 6 months (around 03/08/2025).  Lynwood Rush, MD 09/08/2024 7:00 PM [facility]     [1] No Known Allergies [2]  Current Outpatient Medications on File Prior to Visit  Medication Sig Dispense Refill   aspirin 81 MG tablet Take 81 mg by mouth daily.     ibuprofen (ADVIL,MOTRIN) 200 MG tablet Take 200 mg by mouth every 6 (six) hours as needed for moderate pain.     Multiple Vitamin (MULTIVITAMIN) tablet Take 1 tablet by mouth daily.     No current facility-administered medications on file prior to visit.

## 2024-09-08 NOTE — Assessment & Plan Note (Signed)
 Lab Results  Component Value Date   HGBA1C 5.8 09/08/2024   Stable, pt to continue current medical treatment  - diet, wt control

## 2024-09-08 NOTE — Assessment & Plan Note (Signed)
 Lab Results  Component Value Date   LDLCALC 80 09/08/2024   Stable, pt to continue current statin lipitor 40 mg qd

## 2024-09-08 NOTE — Assessment & Plan Note (Signed)
 Last vitamin D  Lab Results  Component Value Date   VD25OH 89.20 03/02/2024   Stable, cont oral replacement

## 2024-09-08 NOTE — Assessment & Plan Note (Signed)
 Lab Results  Component Value Date   PSA 4.46 (H) 09/03/2023   PSA 3.66 09/07/2022   PSA 2.44 03/08/2022   No biopsy needed per pt having seen urology recently,  to f/u any worsening symptoms or concerns

## 2024-09-08 NOTE — Patient Instructions (Signed)

## 2024-11-02 ENCOUNTER — Encounter: Admitting: Family Medicine

## 2025-02-10 ENCOUNTER — Encounter: Admitting: Family Medicine

## 2025-02-18 ENCOUNTER — Ambulatory Visit
